# Patient Record
Sex: Female | Born: 1955 | Race: White | Hispanic: No | Smoking: Never smoker
Health system: Southern US, Community
[De-identification: ages and names within clinical notes are randomized; demographics above are authoritative.]

## PROBLEM LIST (undated history)

## (undated) DIAGNOSIS — I10 Essential (primary) hypertension: Secondary | ICD-10-CM

## (undated) DIAGNOSIS — M1990 Unspecified osteoarthritis, unspecified site: Secondary | ICD-10-CM

## (undated) DIAGNOSIS — G43909 Migraine, unspecified, not intractable, without status migrainosus: Secondary | ICD-10-CM

## (undated) DIAGNOSIS — C801 Malignant (primary) neoplasm, unspecified: Secondary | ICD-10-CM

## (undated) DIAGNOSIS — G4730 Sleep apnea, unspecified: Secondary | ICD-10-CM

## (undated) HISTORY — PX: HERNIA REPAIR: SHX51

## (undated) HISTORY — PX: WRIST SURGERY: SHX841

## (undated) HISTORY — PX: COSMETIC SURGERY: SHX468

## (undated) HISTORY — PX: REPLACEMENT TOTAL KNEE: SUR1224

## (undated) HISTORY — DX: Sleep apnea, unspecified: G47.30

## (undated) HISTORY — PX: BREAST SURGERY: SHX581

---

## 2019-02-14 ENCOUNTER — Encounter (HOSPITAL_BASED_OUTPATIENT_CLINIC_OR_DEPARTMENT_OTHER): Payer: Self-pay | Admitting: Emergency Medicine

## 2019-02-14 ENCOUNTER — Emergency Department (HOSPITAL_BASED_OUTPATIENT_CLINIC_OR_DEPARTMENT_OTHER): Payer: BLUE CROSS/BLUE SHIELD

## 2019-02-14 ENCOUNTER — Other Ambulatory Visit: Payer: Self-pay

## 2019-02-14 ENCOUNTER — Emergency Department (HOSPITAL_BASED_OUTPATIENT_CLINIC_OR_DEPARTMENT_OTHER)
Admission: EM | Admit: 2019-02-14 | Discharge: 2019-02-14 | Disposition: A | Discharge status: Home / Self Care | Payer: BLUE CROSS/BLUE SHIELD | Attending: Emergency Medicine | Admitting: Emergency Medicine

## 2019-02-14 DIAGNOSIS — I10 Essential (primary) hypertension: Secondary | ICD-10-CM | POA: Diagnosis not present

## 2019-02-14 DIAGNOSIS — Z79899 Other long term (current) drug therapy: Secondary | ICD-10-CM | POA: Diagnosis not present

## 2019-02-14 DIAGNOSIS — R51 Headache: Secondary | ICD-10-CM | POA: Diagnosis present

## 2019-02-14 DIAGNOSIS — R002 Palpitations: Secondary | ICD-10-CM | POA: Insufficient documentation

## 2019-02-14 DIAGNOSIS — Z853 Personal history of malignant neoplasm of breast: Secondary | ICD-10-CM | POA: Diagnosis not present

## 2019-02-14 HISTORY — DX: Malignant (primary) neoplasm, unspecified: C80.1

## 2019-02-14 LAB — CBC
HCT: 46.4 % — ABNORMAL HIGH (ref 36.0–46.0)
Hemoglobin: 14.8 g/dL (ref 12.0–15.0)
MCH: 28.2 pg (ref 26.0–34.0)
MCHC: 31.9 g/dL (ref 30.0–36.0)
MCV: 88.4 fL (ref 80.0–100.0)
Platelets: 278 10*3/uL (ref 150–400)
RBC: 5.25 MIL/uL — ABNORMAL HIGH (ref 3.87–5.11)
RDW: 12.9 % (ref 11.5–15.5)
WBC: 8.3 10*3/uL (ref 4.0–10.5)
nRBC: 0 % (ref 0.0–0.2)

## 2019-02-14 LAB — BASIC METABOLIC PANEL
Anion gap: 5 (ref 5–15)
BUN: 12 mg/dL (ref 8–23)
CO2: 22 mmol/L (ref 22–32)
Calcium: 9.5 mg/dL (ref 8.9–10.3)
Chloride: 109 mmol/L (ref 98–111)
Creatinine, Ser: 0.62 mg/dL (ref 0.44–1.00)
GFR calc Af Amer: >60 mL/min (ref >60)
GFR calc non Af Amer: >60 mL/min (ref >60)
Glucose, Bld: 113 mg/dL — ABNORMAL HIGH (ref 70–99)
Potassium: 3.5 mmol/L (ref 3.5–5.1)
Sodium: 136 mmol/L (ref 135–145)

## 2019-02-14 LAB — URINALYSIS, ROUTINE W REFLEX MICROSCOPIC
Bilirubin Urine: NEGATIVE
Glucose, UA: NEGATIVE mg/dL
Hgb urine dipstick: NEGATIVE
Ketones, ur: NEGATIVE mg/dL
Leukocytes,Ua: NEGATIVE
Nitrite: NEGATIVE
Protein, ur: NEGATIVE mg/dL
Specific Gravity, Urine: 1.01 (ref 1.005–1.030)
pH: 6 (ref 5.0–8.0)

## 2019-02-14 LAB — TROPONIN I: Troponin I: <0.03 ng/mL (ref <0.03)

## 2019-02-14 MED ORDER — ACETAMINOPHEN 500 MG PO TABS
1000.0000 mg | ORAL_TABLET | Freq: Once | ORAL | Status: AC
  Administered 2019-02-14: 1000 mg via ORAL
  Filled 2019-02-14: qty 2

## 2019-02-14 NOTE — Discharge Instructions (Addendum)
Your labs, urine, EKG, head CT, chest x-ray today were normal.  I recommend that she follow-up closely with your primary care doctor for further instructions on medication adjustments to control your blood pressure.  They may recommend that you increase your lisinopril back to 40 mg daily we will continue to monitor your blood pressure closely but I recommend that this recommendation come from your primary care doctor who will be following you closely.  I recommend that you check your blood pressure twice a day and keep a log of your blood pressures.

## 2019-02-14 NOTE — ED Triage Notes (Signed)
Patient presents via EMS with co elevated bp today; states she felt flushed today; states took bp at home with recordings of 177/109, 176/97, 186/113; states recent bp medication change as well; states called EMS twice today with co headache this evening. States currently feels shaky and co a headache.

## 2019-02-14 NOTE — ED Notes (Signed)
Patient transported to CT 

## 2019-02-14 NOTE — ED Provider Notes (Signed)
TIME SEEN: 12:51 AM  CHIEF COMPLAINT: hypertension, headache  HPI: Patient is a 63 year old female with previous history of breast cancer in remission, hypertension who presents to the emergency department with concerns for elevated blood pressure and posterior headache.  States that tonight she started feeling unwell.  States that she felt like her heart was racing and she could "hear myself breathing".  States she did not feel short of breath or have chest discomfort but states this was not normal for her.  She tried calling her primary care doctor and began to monitor her blood pressure closely at home.  States it was extremely elevated with pressures running in the 170s to 190s/90s to 100s.  States this is not normal for her.  She is on lisinopril 20 mg daily and just had her lisinopril decreased from 40 mg daily secondary to hypotension.  She states that at 6 PM when her symptoms were getting better she took an extra 20 mg of lisinopril and then later that night she took a dose of her hydrochlorothiazide.  States that she called EMS initially around 9:30 PM and decided not to come to the ER.  States around 11 PM she developed a posterior headache that was severe but felt similar to her prior migraines but states she has not had headaches like this for 18 years.  At that time she decided to call EMS back and come to the emergency department.  She denies any current numbness or focal weakness.  States over the past 2 weeks she has had intermittent episodes of numbness in both hands and both feet but none of this currently.  She denies of the headache was sudden onset, worse headache of her life.  No vision changes.  No chest tightness or shortness of breath.  No recent fevers, cough.  States over a week ago she had a "stomach thing" where she had diarrhea but this has resolved.  ROS: See HPI Constitutional: no fever  Eyes: no drainage  ENT: no runny nose   Cardiovascular:  no chest pain; +  palpitations Resp: no SOB  GI: no vomiting GU: no dysuria Integumentary: no rash  Allergy: no hives  Musculoskeletal: no leg swelling  Neurological: no slurred speech ROS otherwise negative  PAST MEDICAL HISTORY/PAST SURGICAL HISTORY:  Past Medical History:  Diagnosis Date  . Cancer [facility])     MEDICATIONS:  Prior to Admission medications   Medication Sig Start Date End Date Taking? Authorizing Provider  lisinopril (PRINIVIL,ZESTRIL) 20 MG tablet Take by mouth. 02/12/19 05/13/19 Yes [provider]  Cholecalciferol (VITAMIN D-1000 MAX ST) 25 MCG (1000 UT) tablet Take by mouth.    [provider]  hydrochlorothiazide (HYDRODIURIL) 12.5 MG tablet hydrochlorothiazide 12.5 mg tablet    [provider]    ALLERGIES:  Allergies  Allergen Reactions  . Nickel Dermatitis  . Tape     SOCIAL HISTORY:  Social History   Tobacco Use  . Smoking status: Never Smoker  . Smokeless tobacco: Never Used  Substance Use Topics  . Alcohol use: Never    Frequency: Never  -  FAMILY HISTORY: History reviewed. No pertinent family history.  EXAM: BP (!) 181/95 (BP Location: Left Arm)   Pulse 84   Temp 98.3 F (36.8 C) (Oral)   Resp 18   Ht 5\' 5"  (1.651 m)   Wt 97.5 kg   SpO2 98%   BMI 35.78 kg/m  CONSTITUTIONAL: Alert and oriented and responds appropriately to questions. Well-appearing; well-nourished,  obese, appears anxious HEAD: Normocephalic EYES: Conjunctivae clear, pupils appear equal, EOMI ENT: normal nose; moist mucous membranes NECK: Supple, no meningismus, no nuchal rigidity, no LAD  CARD: RRR; S1 and S2 appreciated; no murmurs, no clicks, no rubs, no gallops RESP: Normal chest excursion without splinting or tachypnea; breath sounds clear and equal bilaterally; no wheezes, no rhonchi, no rales, no hypoxia or respiratory distress, speaking full sentences ABD/GI: Normal bowel sounds; non-distended; soft, non-tender, no rebound, no guarding, no peritoneal  signs, no hepatosplenomegaly BACK:  The back appears normal with normal range of motion EXT: Normal ROM in all joints; non-tender to palpation; no edema; normal capillary refill; no cyanosis, no calf tenderness or swelling    SKIN: Normal color for age and race; warm; no rash on exposed skin NEURO: Moves all extremities equally, normal sensation diffusely, cranial nerves II through XII intact, normal speech, strength 5/5 in all 4 extremities, normal gait PSYCH: Patient appears very anxious.  Grooming and personal hygiene are appropriate.  MEDICAL DECISION MAKING: Patient here with hypertension.  Had episode of palpitations, rapid breathing earlier today.  States around 11 PM developed a severe posterior headache that has slowly improved.  No focal neurologic deficits.  I suspect that anxiety is contributing significantly to patient's symptoms today.  She appears very anxious here.  She states she is very nervous about being here in the emergency department during the COVID-19 pandemic.  States she called her on-call doctor who instructed her to come to the emergency department.  Blood pressure currently in the 150s to 170s/90s to 100s on my examination.  She has no focal neurologic deficits.  No current chest pain or shortness of breath.  Will obtain screening labs, urine to evaluate for signs of endorgan damage as well as EKG, chest x-ray given her palpitations today.  Also obtain head CT given complaints of severe headache that started at 11 PM that has improved.  Have very low suspicion for stroke, intracranial hemorrhage at this time.  She is requesting Tylenol for pain.  We will monitor her blood pressure closely in the ED.  She took 20 mg of lisinopril yesterday morning, another 20 mg of lisinopril at 6 PM last night and then took hydrochlorothiazide just prior to arrival.  At this time I do not feel she needs medication for blood pressure management but we will watch her blood pressures closely in the  ED.  I suspect they will go down with time.  ED PROGRESS: Patient reports feeling much better.  Blood pressure in the 160s/70s.  Headache gone.  Work-up in the ED unremarkable.  No signs of endorgan damage.  Chest x-ray shows mild cardiomegaly but no acute process.  Troponin negative.  EKG normal.  CT head normal.  Urine shows no hematuria or proteinuria.  Recommended she follow-up with her PCP in the morning.  She is comfortable with this plan.   At this time, I do not feel there is any life-threatening condition present. I have reviewed and discussed all results (EKG, imaging, lab, urine as appropriate) and exam findings with patient/family. I have reviewed nursing notes and appropriate previous records.  I feel the patient is safe to be discharged home without further emergent workup and can continue workup as an outpatient as needed. Discussed usual and customary return precautions. Patient/family verbalize understanding and are comfortable with this plan.  Outpatient follow-up has been provided as needed. All questions have been answered.      Date: 02/14/2019 1:16 AM  Rate: 67  Rhythm: normal sinus rhythm  QRS Axis: normal  Intervals: normal  ST/T Wave abnormalities: normal  Conduction Disutrbances: none  Narrative Interpretation: unremarkable; no old tracing for comparison       Ward, Delice Bison, DO 02/14/19 [id number]

## 2019-08-22 ENCOUNTER — Other Ambulatory Visit: Payer: Self-pay

## 2019-08-22 ENCOUNTER — Emergency Department (HOSPITAL_BASED_OUTPATIENT_CLINIC_OR_DEPARTMENT_OTHER)
Admission: EM | Admit: 2019-08-22 | Discharge: 2019-08-23 | Disposition: A | Discharge status: Home / Self Care | Payer: BC Managed Care – PPO | Attending: Emergency Medicine | Admitting: Emergency Medicine

## 2019-08-22 ENCOUNTER — Emergency Department (HOSPITAL_BASED_OUTPATIENT_CLINIC_OR_DEPARTMENT_OTHER): Payer: BC Managed Care – PPO

## 2019-08-22 ENCOUNTER — Encounter (HOSPITAL_BASED_OUTPATIENT_CLINIC_OR_DEPARTMENT_OTHER): Payer: Self-pay

## 2019-08-22 DIAGNOSIS — Z79899 Other long term (current) drug therapy: Secondary | ICD-10-CM | POA: Insufficient documentation

## 2019-08-22 DIAGNOSIS — I10 Essential (primary) hypertension: Secondary | ICD-10-CM | POA: Diagnosis present

## 2019-08-22 HISTORY — DX: Essential (primary) hypertension: I10

## 2019-08-22 HISTORY — DX: Unspecified osteoarthritis, unspecified site: M19.90

## 2019-08-22 LAB — CBC
HCT: 47.6 % — ABNORMAL HIGH (ref 36.0–46.0)
Hemoglobin: 15.1 g/dL — ABNORMAL HIGH (ref 12.0–15.0)
MCH: 28.1 pg (ref 26.0–34.0)
MCHC: 31.7 g/dL (ref 30.0–36.0)
MCV: 88.6 fL (ref 80.0–100.0)
Platelets: 267 10*3/uL (ref 150–400)
RBC: 5.37 MIL/uL — ABNORMAL HIGH (ref 3.87–5.11)
RDW: 12.5 % (ref 11.5–15.5)
WBC: 10 10*3/uL (ref 4.0–10.5)
nRBC: 0 % (ref 0.0–0.2)

## 2019-08-22 NOTE — ED Notes (Signed)
Patient denies pain.

## 2019-08-22 NOTE — ED Triage Notes (Signed)
Pt reports increased B/P today at home with the highest listed as 192/118. Pt states she has had associated "heartburn feeling," palpitations, nervous feeling. Pt appears anxious during triage.

## 2019-08-22 NOTE — ED Provider Notes (Signed)
[place] DEPT [facility] Provider Note: Georgena Spurling, MD, FACEP  CSN: [id number] MRN: [id number] ARRIVAL: 08/22/19 at 2253 ROOM: [place]  Hypertension   HISTORY OF PRESENT ILLNESS  08/22/19 11:34 PM Kirsten Choi is a 63 y.o. female she reports her blood pressure being elevated today with her highest reading at 192/118.  On arrival here was 193/104.  She has had an associated sensation of heartburn and bloating in her abdomen for which she has taken Tums with partial relief.  She has had palpitations palpitations (both rapid heartbeat and skipped beats) and feeling nervous.  She denies chest pain, change in her baseline shortness of breath, headache or focal neurologic changes.  She recently had her dose of lisinopril lowered to 30 mg but states she has been compliant daily.     Past Medical History:  Diagnosis Date  . Arthritis   . Cancer ([place])    breast CA (resolved)  . Hypertension     Past Surgical History:  Procedure Laterality Date  . BREAST SURGERY    . WRIST SURGERY      No family history on file.  Social History   Tobacco Use  . Smoking status: Never Smoker  . Smokeless tobacco: Never Used  Substance Use Topics  . Alcohol use: Never    Frequency: Never  . Drug use: Never    Prior to Admission medications   Medication Sig Start Date End Date Taking? Authorizing Provider  Cholecalciferol (VITAMIN D-1000 MAX ST) 25 MCG (1000 UT) tablet Take by mouth.    [provider]  hydrochlorothiazide (HYDRODIURIL) 12.5 MG tablet hydrochlorothiazide 12.5 mg tablet    [provider]  lisinopril (PRINIVIL,ZESTRIL) 20 MG tablet Take by mouth. 02/12/19 05/13/19  [provider]    Allergies Nickel and Tape   REVIEW OF SYSTEMS  Negative except as noted here or in the History of Present Illness.   PHYSICAL EXAMINATION  Initial Vital Signs Blood pressure (!) 193/104, pulse (!) 112, temperature 98.1 F (36.7 C),  temperature source Oral, resp. rate 20, height 5\' 5"  (1.651 m), weight 94.3 kg, SpO2 98 %.  Examination General: Well-developed, well-nourished female in no acute distress; appearance consistent with age of record HENT: normocephalic; atraumatic Eyes: pupils equal, round and reactive to light; extraocular muscles intact Neck: supple Heart: regular rate and rhythm Lungs: clear to auscultation bilaterally Abdomen: soft; nondistended; nontender; bowel sounds present Extremities: No deformity; full range of motion; pulses normal Neurologic: Awake, alert and oriented; motor function intact in all extremities and symmetric; no facial droop Skin: Warm and dry Psychiatric: Anxious   RESULTS  Summary of this visit's results, reviewed by myself:   EKG Interpretation  Date/Time:  Friday August 22 2019 23:07:36 EDT Ventricular Rate:  95 PR Interval:    QRS Duration: 87 QT Interval:  332 QTC Calculation: 418 R Axis:   -10 Text Interpretation:  Sinus rhythm Probable left atrial enlargement Low voltage, precordial leads Rate is faster Confirmed by Anira Senegal [phone]) on 08/22/2019 11:32:57 PM      Laboratory Studies: Results for orders placed or performed during the hospital encounter of 08/22/19 (from the past 24 hour(s))  Basic metabolic panel     Status: Abnormal   Collection Time: 08/22/19 11:25 PM  Result Value Ref Range   Sodium 140 135 - 145 mmol/L   Potassium 3.8 3.5 - 5.1 mmol/L   Chloride 108 98 - 111 mmol/L   CO2 20 (L) 22 - 32 mmol/L  Glucose, Bld 110 (H) 70 - 99 mg/dL   BUN 14 8 - 23 mg/dL   Creatinine, Ser 0.58 0.44 - 1.00 mg/dL   Calcium 9.5 8.9 - 10.3 mg/dL   GFR calc non Af Amer >60 >60 mL/min   GFR calc Af Amer >60 >60 mL/min   Anion gap 12 5 - 15  CBC     Status: Abnormal   Collection Time: 08/22/19 11:25 PM  Result Value Ref Range   WBC 10.0 4.0 - 10.5 K/uL   RBC 5.37 (H) 3.87 - 5.11 MIL/uL   Hemoglobin 15.1 (H) 12.0 - 15.0 g/dL   HCT 47.6 (H) 36.0 - 46.0  %   MCV 88.6 80.0 - 100.0 fL   MCH 28.1 26.0 - 34.0 pg   MCHC 31.7 30.0 - 36.0 g/dL   RDW 12.5 11.5 - 15.5 %   Platelets 267 150 - 400 K/uL   nRBC 0.0 0.0 - 0.2 %  Troponin I (High Sensitivity)     Status: None   Collection Time: 08/22/19 11:25 PM  Result Value Ref Range   Troponin I (High Sensitivity) 13 <18 ng/L   Imaging Studies: Dg Chest 2 View  Result Date: 08/22/2019 CLINICAL DATA:  Palpitations, elevated blood pressure. EXAM: CHEST - 2 VIEW COMPARISON:  Chest radiograph 02/14/2019 FINDINGS: Streaky areas of opacity favoring atelectasis with low lung volumes. No consolidation, features of edema, pneumothorax, or effusion. Pulmonary vascularity is normally distributed. The cardiomediastinal contours are unremarkable. No acute osseous or soft tissue abnormality. Surgical clips project over the right chest wall. IMPRESSION: 1. No active cardiopulmonary disease. 2. Streaky areas of opacity favoring atelectasis. Electronically Signed   By: Lovena Le M.D.   On: 08/22/2019 23:24    ED COURSE and MDM  Nursing notes and initial vitals signs, including pulse oximetry, reviewed.  Vitals:   08/22/19 2304 08/22/19 2305  BP: (!) 193/104   Pulse: (!) 112   Resp: 20   Temp: 98.1 F (36.7 C)   TempSrc: Oral   SpO2: 98%   Weight:  94.3 kg  Height:  5\' 5"  (1.651 m)   12:43 AM Patient's BP now 172/100 without intervention.  Patient's rhythm strip reviewed for her entire visit.  No ectopy or tachyarrhythmias seen.  There is no evidence of acute endorgan damage so acute intervention and her blood pressure is not indicated at this time.  She was advised to contact her primary care physician Monday (the day after tomorrow) regarding medication management.  PROCEDURES    ED DIAGNOSES     ICD-10-CM   1. Hypertension not at goal  [facility], Jenny Reichmann, MD 08/23/19 [phone]

## 2019-08-23 ENCOUNTER — Encounter (HOSPITAL_BASED_OUTPATIENT_CLINIC_OR_DEPARTMENT_OTHER): Payer: Self-pay | Admitting: Emergency Medicine

## 2019-08-23 ENCOUNTER — Other Ambulatory Visit: Payer: Self-pay

## 2019-08-23 ENCOUNTER — Emergency Department (HOSPITAL_BASED_OUTPATIENT_CLINIC_OR_DEPARTMENT_OTHER)
Admission: EM | Admit: 2019-08-23 | Discharge: 2019-08-23 | Disposition: A | Discharge status: Home / Self Care | Payer: BC Managed Care – PPO | Source: Home / Self Care | Attending: Emergency Medicine | Admitting: Emergency Medicine

## 2019-08-23 ENCOUNTER — Emergency Department (HOSPITAL_BASED_OUTPATIENT_CLINIC_OR_DEPARTMENT_OTHER): Payer: BC Managed Care – PPO

## 2019-08-23 DIAGNOSIS — I10 Essential (primary) hypertension: Secondary | ICD-10-CM | POA: Insufficient documentation

## 2019-08-23 DIAGNOSIS — G43409 Hemiplegic migraine, not intractable, without status migrainosus: Secondary | ICD-10-CM

## 2019-08-23 DIAGNOSIS — Z91048 Other nonmedicinal substance allergy status: Secondary | ICD-10-CM | POA: Insufficient documentation

## 2019-08-23 DIAGNOSIS — Z79899 Other long term (current) drug therapy: Secondary | ICD-10-CM | POA: Insufficient documentation

## 2019-08-23 DIAGNOSIS — R519 Headache, unspecified: Secondary | ICD-10-CM | POA: Insufficient documentation

## 2019-08-23 DIAGNOSIS — G43909 Migraine, unspecified, not intractable, without status migrainosus: Secondary | ICD-10-CM | POA: Insufficient documentation

## 2019-08-23 DIAGNOSIS — Z853 Personal history of malignant neoplasm of breast: Secondary | ICD-10-CM | POA: Insufficient documentation

## 2019-08-23 HISTORY — DX: Migraine, unspecified, not intractable, without status migrainosus: G43.909

## 2019-08-23 LAB — BASIC METABOLIC PANEL
Anion gap: 12 (ref 5–15)
BUN: 14 mg/dL (ref 8–23)
CO2: 20 mmol/L — ABNORMAL LOW (ref 22–32)
Calcium: 9.5 mg/dL (ref 8.9–10.3)
Chloride: 108 mmol/L (ref 98–111)
Creatinine, Ser: 0.58 mg/dL (ref 0.44–1.00)
GFR calc Af Amer: >60 mL/min (ref >60)
GFR calc non Af Amer: >60 mL/min (ref >60)
Glucose, Bld: 110 mg/dL — ABNORMAL HIGH (ref 70–99)
Potassium: 3.8 mmol/L (ref 3.5–5.1)
Sodium: 140 mmol/L (ref 135–145)

## 2019-08-23 LAB — TROPONIN I (HIGH SENSITIVITY): Troponin I (High Sensitivity): 13 ng/L (ref <18)

## 2019-08-23 MED ORDER — DIPHENHYDRAMINE HCL 50 MG/ML IJ SOLN
25.0000 mg | Freq: Once | INTRAMUSCULAR | Status: AC
  Administered 2019-08-23: 25 mg via INTRAVENOUS
  Filled 2019-08-23: qty 1

## 2019-08-23 MED ORDER — CLONIDINE HCL 0.1 MG PO TABS
0.2000 mg | ORAL_TABLET | Freq: Once | ORAL | Status: AC
  Administered 2019-08-23: 0.2 mg via ORAL
  Filled 2019-08-23: qty 2

## 2019-08-23 MED ORDER — LISINOPRIL 10 MG PO TABS
10.0000 mg | ORAL_TABLET | Freq: Once | ORAL | Status: AC
  Administered 2019-08-23: 18:00:00 10 mg via ORAL
  Filled 2019-08-23: qty 1

## 2019-08-23 MED ORDER — KETOROLAC TROMETHAMINE 15 MG/ML IJ SOLN
15.0000 mg | Freq: Once | INTRAMUSCULAR | Status: AC
  Administered 2019-08-23: 06:00:00 15 mg via INTRAVENOUS
  Filled 2019-08-23: qty 1

## 2019-08-23 MED ORDER — METOCLOPRAMIDE HCL 5 MG/ML IJ SOLN
10.0000 mg | Freq: Once | INTRAMUSCULAR | Status: AC
  Administered 2019-08-23: 10 mg via INTRAVENOUS
  Filled 2019-08-23: qty 2

## 2019-08-23 NOTE — ED Provider Notes (Signed)
[place] EMERGENCY DEPARTMENT Provider Note   CSN: [id number] Arrival date & time: 08/23/19  1613     History   Chief Complaint Chief Complaint  Patient presents with  . Hypertension    HPI Kirsten Choi is a 63 y.o. female hx of HTN, here presenting with hypertension, headaches.  Patient states that her lisinopril was decreased from 40 mg down to 30 mg about a week ago because her blood pressure was running low.  She states that for the last 2 days, she has been having elevated blood pressure initially to the 160s and 170s. She was seen twice last night for uncontrolled hypertension.  She had labs drawn that were unremarkable. No head imaging was performed. She came back today because she has persistent headaches and BP in the 180s. Denies any trouble speaking or weakness or numbness.  Patient called her doctor was sent here for further evaluation.    The history is provided by the patient.    Past Medical History:  Diagnosis Date  . Arthritis   . Cancer ([place])    breast CA (resolved)  . Hypertension   . Migraine     There are no active problems to display for this patient.   Past Surgical History:  Procedure Laterality Date  . BREAST SURGERY    . WRIST SURGERY       OB History   No obstetric history on file.      Home Medications    Prior to Admission medications   Medication Sig Start Date End Date Taking? Authorizing Provider  acetaminophen (TYLENOL) 500 MG tablet Take 1,000 mg by mouth every 6 (six) hours as needed.   Yes [provider]  hydrochlorothiazide (HYDRODIURIL) 12.5 MG tablet hydrochlorothiazide 12.5 mg tablet   Yes [provider]  lisinopril (ZESTRIL) 30 MG tablet Take 30 mg by mouth daily.   Yes [provider]  Cholecalciferol (VITAMIN D-1000 MAX ST) 25 MCG (1000 UT) tablet Take by mouth.    [provider]  lisinopril (PRINIVIL,ZESTRIL) 20 MG tablet Take by mouth. 02/12/19 05/13/19  [provider]    Family History No family history on file.  Social History Social History   Tobacco Use  . Smoking status: Never Smoker  . Smokeless tobacco: Never Used  Substance Use Topics  . Alcohol use: Never    Frequency: Never  . Drug use: Never     Allergies   Nickel and Tape   Review of Systems Review of Systems  Neurological: Positive for headaches.  All other systems reviewed and are negative.    Physical Exam Updated Vital Signs BP (!) 203/111 (BP Location: Left Arm)   Pulse 88   Temp 98.2 F (36.8 C) (Oral)   Resp 18   Ht 5\' 5"  (1.651 m)   Wt 93.3 kg   SpO2 99%   BMI 34.23 kg/m   Physical Exam Vitals signs and nursing note reviewed.  Constitutional:      Comments: Anxious   HENT:     Head: Normocephalic.     Mouth/Throat:     Mouth: Mucous membranes are moist.  Eyes:     Extraocular Movements: Extraocular movements intact.     Pupils: Pupils are equal, round, and reactive to light.  Neck:     Musculoskeletal: Normal range of motion.  Cardiovascular:     Rate and Rhythm: Normal rate and regular rhythm.     Pulses: Normal pulses.  Pulmonary:  Effort: Pulmonary effort is normal.     Breath sounds: Normal breath sounds.  Abdominal:     General: Abdomen is flat.     Palpations: Abdomen is soft.  Musculoskeletal: Normal range of motion.  Skin:    General: Skin is warm.     Capillary Refill: Capillary refill takes less than 2 seconds.  Neurological:     General: No focal deficit present.     Mental Status: She is alert and oriented to person, place, and time.     Comments: CN 2- 12 intact, nl strength and sensation throughout. Nl gait. No pronator drift   Psychiatric:        Mood and Affect: Mood normal.      ED Treatments / Results  Labs (all labs ordered are listed, but only abnormal results are displayed) Labs Reviewed - No data to display  EKG None  Radiology Dg Chest 2 View  Result Date: 08/22/2019 CLINICAL DATA:   Palpitations, elevated blood pressure. EXAM: CHEST - 2 VIEW COMPARISON:  Chest radiograph 02/14/2019 FINDINGS: Streaky areas of opacity favoring atelectasis with low lung volumes. No consolidation, features of edema, pneumothorax, or effusion. Pulmonary vascularity is normally distributed. The cardiomediastinal contours are unremarkable. No acute osseous or soft tissue abnormality. Surgical clips project over the right chest wall. IMPRESSION: 1. No active cardiopulmonary disease. 2. Streaky areas of opacity favoring atelectasis. Electronically Signed   By: Lovena Le M.D.   On: 08/22/2019 23:24   Ct Head Wo Contrast  Result Date: 08/23/2019 CLINICAL DATA:  63 year old female with headache. EXAM: CT HEAD WITHOUT CONTRAST TECHNIQUE: Contiguous axial images were obtained from the base of the skull through the vertex without intravenous contrast. COMPARISON:  Head CT dated 02/14/2019 FINDINGS: Brain: The ventricles and sulci appropriate size for patient's age. The gray-white matter discrimination is preserved. There is no acute intracranial hemorrhage. No mass effect or midline shift. No extra-axial fluid collection. Vascular: No hyperdense vessel or unexpected calcification. Skull: Normal. Negative for fracture or focal lesion. Sinuses/Orbits: No acute finding. Other: None IMPRESSION: Unremarkable noncontrast CT of the brain. Electronically Signed   By: Anner Crete M.D.   On: 08/23/2019 18:14    Procedures Procedures (including critical care time)  Medications Ordered in ED Medications  lisinopril (ZESTRIL) tablet 10 mg (10 mg Oral Given 08/23/19 1742)  cloNIDine (CATAPRES) tablet 0.2 mg (0.2 mg Oral Given 08/23/19 1843)     Initial Impression / Assessment and Plan / ED Course  I have reviewed the triage vital signs and the nursing notes.  Pertinent labs & imaging results that were available during my care of the patient were reviewed by me and considered in my medical decision making (see  chart for details).       Kirsten Choi is a 63 y.o. female here with headaches. Patient's lisinopril was decreased to 30 mg from 40 mg a week ago and now BP in the 190s. I think likely symptomatic hypertension and she is very anxious. Labs yesterday were unremarkable. Will get CT head to r/o bleed. Will give extra dose of lisinopril and recheck blood pressure.   7:53 PM CT head unremarkable. BP down to 160s now. Will have her increase her lisinopril to 40 mg daily and continue HCTZ   Final Clinical Impressions(s) / ED Diagnoses   Final diagnoses:  None    ED Discharge Orders    None       Drenda Freeze, MD 08/23/19 1954

## 2019-08-23 NOTE — ED Triage Notes (Signed)
Patient presents via EMS with complaints of headache and nausea; patient seen here earlier today for hypertension and discharged. Patient ambulatory with steady gait.

## 2019-08-23 NOTE — ED Notes (Signed)
Pt given meal and waiting for ride.

## 2019-08-23 NOTE — ED Provider Notes (Signed)
[facility] DE[facility] Provider Note: Georgena Spurling, MD, FACEP  CSN: [id number] MRN: [id number] ARRIVAL: 08/23/19 at [place]: [place]  Headache   HISTORY OF PRESENT ILLNESS  08/23/19 5:33 AM Kirsten Choi is a 63 y.o. female who was seen by myself earlier this shift for elevated blood pressure.  Her work-up was benign and she was discharged home with reassurance.  She returns by ambulance with a headache that began gradually several hours ago and has worsened.  The onset was associated with scotomata.  The pain is located in her occipital region radiating to the top of her head.  Her pain is sharp and throbbing and she rates it as an 8 out of 10.  There is associated nausea without vomiting and photophobia.  Symptoms are like previous migraines of which she has a remote history.  She has not had a headache like this in years.  She is having no focal neurologic deficits.  She tried applying ice without relief.   Past Medical History:  Diagnosis Date  . Arthritis   . Cancer ([place])    breast CA (resolved)  . Hypertension   . Migraine     Past Surgical History:  Procedure Laterality Date  . BREAST SURGERY    . WRIST SURGERY      History reviewed. No pertinent family history.  Social History   Tobacco Use  . Smoking status: Never Smoker  . Smokeless tobacco: Never Used  Substance Use Topics  . Alcohol use: Never    Frequency: Never  . Drug use: Never    Prior to Admission medications   Medication Sig Start Date End Date Taking? Authorizing Provider  Cholecalciferol (VITAMIN D-1000 MAX ST) 25 MCG (1000 UT) tablet Take by mouth.    [provider]  hydrochlorothiazide (HYDRODIURIL) 12.5 MG tablet hydrochlorothiazide 12.5 mg tablet    [provider]  lisinopril (PRINIVIL,ZESTRIL) 20 MG tablet Take by mouth. 02/12/19 05/13/19  [provider]    Allergies Nickel and Tape   REVIEW OF SYSTEMS  Negative except as noted here  or in the History of Present Illness.   PHYSICAL EXAMINATION  Initial Vital Signs Blood pressure (!) 164/124, pulse 85, temperature 98.3 F (36.8 C), temperature source Oral, resp. rate 18, weight 94 kg, SpO2 100 %.  Examination General: Well-developed, well-nourished female in no acute distress; appearance consistent with age of record HENT: normocephalic; atraumatic Eyes: pupils equal, round and reactive to light; extraocular muscles intact; photophobia Neck: supple Heart: regular rate and rhythm Lungs: clear to auscultation bilaterally Abdomen: soft; nondistended; nontender; bowel sounds present Extremities: No deformity; full range of motion; pulses normal Neurologic: Awake, alert and oriented; motor function intact in all extremities and symmetric; no facial droop; normal coordination, speech and gait Skin: Warm and dry Psychiatric: Tearful   RESULTS  Summary of this visit's results, reviewed by myself:   EKG Interpretation  Date/Time:    Ventricular Rate:    PR Interval:    QRS Duration:   QT Interval:    QTC Calculation:   R Axis:     Text Interpretation:        Laboratory Studies: Results for orders placed or performed during the hospital encounter of 08/22/19 (from the past 24 hour(s))  Basic metabolic panel     Status: Abnormal   Collection Time: 08/22/19 11:25 PM  Result Value Ref Range   Sodium 140 135 - 145 mmol/L   Potassium 3.8 3.5 - 5.1  mmol/L   Chloride 108 98 - 111 mmol/L   CO2 20 (L) 22 - 32 mmol/L   Glucose, Bld 110 (H) 70 - 99 mg/dL   BUN 14 8 - 23 mg/dL   Creatinine, Ser 0.58 0.44 - 1.00 mg/dL   Calcium 9.5 8.9 - 10.3 mg/dL   GFR calc non Af Amer >60 >60 mL/min   GFR calc Af Amer >60 >60 mL/min   Anion gap 12 5 - 15  CBC     Status: Abnormal   Collection Time: 08/22/19 11:25 PM  Result Value Ref Range   WBC 10.0 4.0 - 10.5 K/uL   RBC 5.37 (H) 3.87 - 5.11 MIL/uL   Hemoglobin 15.1 (H) 12.0 - 15.0 g/dL   HCT 47.6 (H) 36.0 - 46.0 %   MCV  88.6 80.0 - 100.0 fL   MCH 28.1 26.0 - 34.0 pg   MCHC 31.7 30.0 - 36.0 g/dL   RDW 12.5 11.5 - 15.5 %   Platelets 267 150 - 400 K/uL   nRBC 0.0 0.0 - 0.2 %  Troponin I (High Sensitivity)     Status: None   Collection Time: 08/22/19 11:25 PM  Result Value Ref Range   Troponin I (High Sensitivity) 13 <18 ng/L   Imaging Studies: Dg Chest 2 View  Result Date: 08/22/2019 CLINICAL DATA:  Palpitations, elevated blood pressure. EXAM: CHEST - 2 VIEW COMPARISON:  Chest radiograph 02/14/2019 FINDINGS: Streaky areas of opacity favoring atelectasis with low lung volumes. No consolidation, features of edema, pneumothorax, or effusion. Pulmonary vascularity is normally distributed. The cardiomediastinal contours are unremarkable. No acute osseous or soft tissue abnormality. Surgical clips project over the right chest wall. IMPRESSION: 1. No active cardiopulmonary disease. 2. Streaky areas of opacity favoring atelectasis. Electronically Signed   By: Lovena Le M.D.   On: 08/22/2019 23:24    ED COURSE and MDM  Nursing notes and initial vitals signs, including pulse oximetry, reviewed.  Vitals:   08/23/19 0459  BP: (!) 164/124  Pulse: 85  Resp: 18  Temp: 98.3 F (36.8 C)  TempSrc: Oral  SpO2: 100%  Weight: 94 kg   6:51 AM Patient's pain significantly improved but not completely abated after IV medications.  Patient able to ambulate without difficulty.  History consistent with migraine.  PROCEDURES    ED DIAGNOSES     ICD-10-CM   1. Sporadic migraine  G43.Arnot        Uzoma Vivona, Jenny Reichmann, MD 08/23/19 [phone]

## 2019-08-23 NOTE — ED Notes (Signed)
Pt tolerating water for po challenge.

## 2019-08-23 NOTE — Discharge Instructions (Signed)
Increase lisinopril to 40 mg daily  Continue your other meds   See your doctor in a week   Return to ER if you have worse headaches, chest pain, dizziness

## 2019-08-23 NOTE — ED Triage Notes (Signed)
Reports being here yesterday for elevated bp.  Reports came back today because it is still elevated.  Reports the migraine she had yesterday went away but keeps coming back.

## 2019-08-26 ENCOUNTER — Other Ambulatory Visit: Payer: Self-pay

## 2019-08-26 ENCOUNTER — Emergency Department (HOSPITAL_BASED_OUTPATIENT_CLINIC_OR_DEPARTMENT_OTHER): Payer: BC Managed Care – PPO

## 2019-08-26 ENCOUNTER — Encounter (HOSPITAL_BASED_OUTPATIENT_CLINIC_OR_DEPARTMENT_OTHER): Payer: Self-pay

## 2019-08-26 ENCOUNTER — Emergency Department (HOSPITAL_BASED_OUTPATIENT_CLINIC_OR_DEPARTMENT_OTHER)
Admission: EM | Admit: 2019-08-26 | Discharge: 2019-08-26 | Disposition: A | Discharge status: Home / Self Care | Payer: BC Managed Care – PPO | Attending: Emergency Medicine | Admitting: Emergency Medicine

## 2019-08-26 DIAGNOSIS — I10 Essential (primary) hypertension: Secondary | ICD-10-CM | POA: Diagnosis present

## 2019-08-26 DIAGNOSIS — R1084 Generalized abdominal pain: Secondary | ICD-10-CM | POA: Insufficient documentation

## 2019-08-26 LAB — URINALYSIS, ROUTINE W REFLEX MICROSCOPIC
Bilirubin Urine: NEGATIVE
Glucose, UA: NEGATIVE mg/dL
Ketones, ur: NEGATIVE mg/dL
Leukocytes,Ua: NEGATIVE
Nitrite: NEGATIVE
Protein, ur: NEGATIVE mg/dL
Specific Gravity, Urine: 1.02 (ref 1.005–1.030)
pH: 6 (ref 5.0–8.0)

## 2019-08-26 LAB — CBC WITH DIFFERENTIAL/PLATELET
Abs Immature Granulocytes: 0.04 10*3/uL (ref 0.00–0.07)
Basophils Absolute: 0.1 10*3/uL (ref 0.0–0.1)
Basophils Relative: 1 %
Eosinophils Absolute: 0.1 10*3/uL (ref 0.0–0.5)
Eosinophils Relative: 1 %
HCT: 46 % (ref 36.0–46.0)
Hemoglobin: 14.7 g/dL (ref 12.0–15.0)
Immature Granulocytes: 0 %
Lymphocytes Relative: 15 %
Lymphs Abs: 1.4 10*3/uL (ref 0.7–4.0)
MCH: 28.4 pg (ref 26.0–34.0)
MCHC: 32 g/dL (ref 30.0–36.0)
MCV: 88.8 fL (ref 80.0–100.0)
Monocytes Absolute: 0.9 10*3/uL (ref 0.1–1.0)
Monocytes Relative: 9 %
Neutro Abs: 7.2 10*3/uL (ref 1.7–7.7)
Neutrophils Relative %: 74 %
Platelets: 299 10*3/uL (ref 150–400)
RBC: 5.18 MIL/uL — ABNORMAL HIGH (ref 3.87–5.11)
RDW: 12.4 % (ref 11.5–15.5)
WBC: 9.7 10*3/uL (ref 4.0–10.5)
nRBC: 0 % (ref 0.0–0.2)

## 2019-08-26 LAB — COMPREHENSIVE METABOLIC PANEL
ALT: 20 U/L (ref 0–44)
AST: 14 U/L — ABNORMAL LOW (ref 15–41)
Albumin: 4.3 g/dL (ref 3.5–5.0)
Alkaline Phosphatase: 69 U/L (ref 38–126)
Anion gap: 9 (ref 5–15)
BUN: 20 mg/dL (ref 8–23)
CO2: 24 mmol/L (ref 22–32)
Calcium: 9.3 mg/dL (ref 8.9–10.3)
Chloride: 106 mmol/L (ref 98–111)
Creatinine, Ser: 0.68 mg/dL (ref 0.44–1.00)
GFR calc Af Amer: >60 mL/min (ref >60)
GFR calc non Af Amer: >60 mL/min (ref >60)
Glucose, Bld: 110 mg/dL — ABNORMAL HIGH (ref 70–99)
Potassium: 3.7 mmol/L (ref 3.5–5.1)
Sodium: 139 mmol/L (ref 135–145)
Total Bilirubin: 0.5 mg/dL (ref 0.3–1.2)
Total Protein: 7.1 g/dL (ref 6.5–8.1)

## 2019-08-26 LAB — LIPASE, BLOOD: Lipase: 48 U/L (ref 11–51)

## 2019-08-26 LAB — URINALYSIS, MICROSCOPIC (REFLEX): WBC, UA: NONE SEEN WBC/hpf (ref 0–5)

## 2019-08-26 NOTE — Discharge Instructions (Signed)

## 2019-08-26 NOTE — ED Triage Notes (Signed)
Pt c/o LUQ pain that started yesterday PM. Pt also c/o B/P being high. Pt c/o associated bloated feeling and swelling in the legs.

## 2019-08-26 NOTE — ED Provider Notes (Signed)
Emergency Department Provider Note   I have reviewed the triage vital signs and the nursing notes.   HISTORY  Chief Complaint Abdominal Pain and Hypertension   HPI Kirsten Choi is a 63 y.o. female with past medical history of hypertension presents to the emergency department with continued fluctuations in blood pressure.  Denies active headache symptoms, which brought her into the emergency department previously, but is having some abdominal bloating and lower leg swelling.  She shows me her blood pressure log which shows fluctuations in her blood pressure with lowest blood pressure systolic [id number] to highest systolic [id number].  She denies chest pain or shortness of breath.  She notes regular bowel movements but had some straining several days ago and took MiraLAX.  She feels that this MiraLAX may have caused her blood pressure to increase.  She continues to have regular bowel movements without diarrhea or additional constipation.  No blood in the bowel movements.  Denies vision changes, headaches.  She called her primary care doctor about some of her elevated blood pressure readings this afternoon and has been scheduled to see her PCP at 9 AM tomorrow.  She continues to be compliant with her lisinopril 40 mg tabs.    Past Medical History:  Diagnosis Date  . Arthritis   . Cancer ([place])    breast CA (resolved)  . Hypertension   . Migraine     There are no active problems to display for this patient.   Past Surgical History:  Procedure Laterality Date  . BREAST SURGERY    . HERNIA REPAIR    . WRIST SURGERY      Allergies Nickel and Tape  No family history on file.  Social History Social History   Tobacco Use  . Smoking status: Never Smoker  . Smokeless tobacco: Never Used  Substance Use Topics  . Alcohol use: Never    Frequency: Never  . Drug use: Never    Review of Systems  Constitutional: No fever/chills Eyes: No visual changes. ENT: No sore throat. Cardiovascular:  Denies chest pain. Positive elevated BP.  Respiratory: Denies shortness of breath.  Gastrointestinal: Mild abdominal pain/distension.  No nausea, no vomiting.  No diarrhea.  No constipation. Genitourinary: Negative for dysuria. Musculoskeletal: Negative for back pain. Skin: Negative for rash. Neurological: Negative for headaches, focal weakness or numbness.  10-point ROS otherwise negative.  ____________________________________________   PHYSICAL EXAM:  VITAL SIGNS: ED Triage Vitals  Enc Vitals Group     BP 08/26/19 2111 (!) 178/95     Pulse Rate 08/26/19 2111 84     Resp 08/26/19 2111 18     Temp 08/26/19 2111 98.4 F (36.9 C)     Temp Source 08/26/19 2111 Oral     SpO2 08/26/19 2111 96 %     Weight 08/26/19 2111 205 lb (93 kg)     Height 08/26/19 2111 5\' 5"  (1.651 m)   Constitutional: Alert and oriented. Well appearing and in no acute distress. Eyes: Conjunctivae are normal.  Head: Atraumatic. Nose: No congestion/rhinnorhea. Mouth/Throat: Mucous membranes are moist.  Neck: No stridor.  Cardiovascular: Normal rate, regular rhythm. Good peripheral circulation. Grossly normal heart sounds.   Respiratory: Normal respiratory effort.  No retractions. Lungs CTAB. Gastrointestinal: Soft and nontender. No distention.  Musculoskeletal: No lower extremity tenderness with trace bilateral pitting edema. No gross deformities of extremities. Neurologic:  Normal speech and language. No gross focal neurologic deficits are appreciated.  Skin:  Skin is warm, dry and intact. No  rash noted.   ____________________________________________   LABS (all labs ordered are listed, but only abnormal results are displayed)  Labs Reviewed  COMPREHENSIVE METABOLIC PANEL - Abnormal; Notable for the following components:      Result Value   Glucose, Bld 110 (*)    AST 14 (*)    All other components within normal limits  CBC WITH DIFFERENTIAL/PLATELET - Abnormal; Notable for the following  components:   RBC 5.18 (*)    All other components within normal limits  URINALYSIS, ROUTINE W REFLEX MICROSCOPIC - Abnormal; Notable for the following components:   Hgb urine dipstick TRACE (*)    All other components within normal limits  URINALYSIS, MICROSCOPIC (REFLEX) - Abnormal; Notable for the following components:   Bacteria, UA RARE (*)    All other components within normal limits  LIPASE, BLOOD   ____________________________________________  RADIOLOGY  Dg Abdomen Acute W/chest  Result Date: 08/26/2019 CLINICAL DATA:  Abdominal pain EXAM: DG ABDOMEN ACUTE W/ 1V CHEST COMPARISON:  None. FINDINGS: There is no evidence of dilated bowel loops or free intraperitoneal air. Surgical clips are seen throughout the abdomen. No radiopaque calculi or other significant radiographic abnormality is seen. Heart size and mediastinal contours are within normal limits. Both lungs are clear. IMPRESSION: Negative abdominal radiographs.  No acute cardiopulmonary disease. Electronically Signed   By: Prudencio Pair M.D.   On: 08/26/2019 22:21    ____________________________________________   PROCEDURES  Procedure(s) performed:   Procedures  None ____________________________________________   INITIAL IMPRESSION / ASSESSMENT AND PLAN / ED COURSE  Pertinent labs & imaging results that were available during my care of the patient were reviewed by me and considered in my medical decision making (see chart for details).   Patient presents to the emergency department with abdominal pain/distention along with elevated blood pressure here.  Triage blood pressure is 178/95.  Patient denies chest pain, shortness of breath, neuro symptoms.  Her neurologic exam is normal.  Her abdomen is diffusely soft and nontender.  I reviewed her blood pressure log at bedside which does show some fluctuation in her blood pressure readings.  She has a scheduled appointment with her PCP at 9 AM tomorrow to discuss her blood  pressure medication in detail.  With some trace pitting edema on exam I do plan for screening lab work to rule out hypertensive emergency.  I do not plan to acutely lower the patient's blood pressure given they have been somewhat labile.  Plan for plain film of the abdomen and reassess.   Plain films and labs interpreted. No acute findings. Plan for continued BP mgmt as is and f/u with PCP for schedule 9AM appt tomorrow. Discussed ED return precautions. Patient feeling relieved regarding plan and pleased at discharge.  ____________________________________________  FINAL CLINICAL IMPRESSION(S) / ED DIAGNOSES  Final diagnoses:  Essential hypertension  Generalized abdominal pain    Note:  This document was prepared using Dragon voice recognition software and may include unintentional dictation errors.  Nanda Quinton, MD, [facility] Emergency Medicine    Abdo Denault, Wonda [facility], MD 08/27/19 1329

## 2019-10-16 ENCOUNTER — Ambulatory Visit: Payer: BC Managed Care – PPO | Admitting: Cardiovascular Disease

## 2019-12-29 ENCOUNTER — Telehealth: Payer: Self-pay

## 2019-12-29 NOTE — Telephone Encounter (Signed)
NOTES ON FILE FROM [facility] [phone] SENT REFERRAL TO [place]

## 2020-01-20 ENCOUNTER — Other Ambulatory Visit: Payer: Self-pay

## 2020-01-20 ENCOUNTER — Encounter: Payer: Self-pay | Admitting: Cardiovascular Disease

## 2020-01-20 ENCOUNTER — Ambulatory Visit: Payer: BC Managed Care – PPO | Admitting: Cardiovascular Disease

## 2020-01-20 VITALS — BP 156/96 | HR 61 | Ht 65.0 in | Wt 191.4 lb

## 2020-01-20 DIAGNOSIS — R002 Palpitations: Secondary | ICD-10-CM

## 2020-01-20 DIAGNOSIS — I10 Essential (primary) hypertension: Secondary | ICD-10-CM | POA: Diagnosis not present

## 2020-01-20 DIAGNOSIS — E7800 Pure hypercholesterolemia, unspecified: Secondary | ICD-10-CM

## 2020-01-20 DIAGNOSIS — I471 Supraventricular tachycardia: Secondary | ICD-10-CM

## 2020-01-20 DIAGNOSIS — R001 Bradycardia, unspecified: Secondary | ICD-10-CM | POA: Insufficient documentation

## 2020-01-20 NOTE — Assessment & Plan Note (Signed)
Brief beats of SVT noticed on Zio patch monitoring few months ago. Patient reports none frequent episodes of heart racing back to that time but no recent episode.  Her EKG today is sinus rhythm.  She drinks 2 cups of coffee every day.  We recommended avoiding stimulant like coffee and tea as first step of managing paroxysmal SVT.  She agrees with the plan.  We will see her in clinic again in 56-month. -Recommended to taper/stop drinking coffee and other stimulant -Avoiding beta-blockers in setting of bradycardia -Checking thyroid panel -Follow-up in clinic in 3 months

## 2020-01-20 NOTE — Assessment & Plan Note (Addendum)
Kirsten Choi is a new patient in clinic today. Patient has been on as needed lisinopril that is not routine treatment for hypertension.  Her systolic blood pressure at home are mostly at 110s-130s (with few episode of 150s). BP today was elevated at 153/69 but she endorses having Hx of white coat elevated BP. Her BP at home is acceptable on days she was off of Lisinopril. Will DC PRN Lisinopril -DC Lisinopril -Continue low salt diet and exercise/life style modification -Checking Lipid panel today for screening -f/u in clinic in 3 months

## 2020-01-20 NOTE — Assessment & Plan Note (Signed)
Per apple watch report. Mostly at nights. Asymptomatic. (She reports infrequent episodes of mild lightheadedness but it is mostly when she stands up suddenly from sitting position and not significant.  Also not associated with the time that she is bradycardic and does not appear to be related to her bradycardia.) -Avoiding beta-blocker -No further work-up at this point -F/u in clinic in 3 months or sooner if needed

## 2020-01-20 NOTE — Patient Instructions (Signed)
Medication Instructions:  NO CHANGE *If you need a refill on your cardiac medications before your next appointment, please call your pharmacy*   Lab Work: Your physician recommends that you return for lab work in: 2 [place]  If you have labs (blood work) drawn today and your tests are completely normal, you will receive your results only by: Marland Kitchen MyChart Message (if you have MyChart) OR . A paper copy in the mail If you have any lab test that is abnormal or we need to change your treatment, we will call you to review the results.   Follow-Up: At [facility], you and your health needs are our priority.  As part of our continuing mission to provide you with exceptional heart care, we have created designated Provider Care Teams.  These Care Teams include your primary Cardiologist (physician) and Advanced Practice Providers (APPs -  Physician Assistants and Nurse Practitioners) who all work together to provide you with the care you need, when you need it.  We recommend signing up for the patient portal called "MyChart".  Sign up information is provided on this After Visit Summary.  MyChart is used to connect with patients for Virtual Visits (Telemedicine).  Patients are able to view lab/test results, encounter notes, upcoming appointments, etc.  Non-urgent messages can be sent to your provider as well.   To learn more about what you can do with MyChart, go to [email or web address].    Your next appointment:   3 month(s)  The format for your next appointment:   In Person  Provider:   Quay Burow, MD

## 2020-01-20 NOTE — Progress Notes (Signed)
01/20/2020 Kirsten Choi   1956/06/12  [id number]  Primary Physician Francesca Oman, DO Primary Cardiologist: Lorretta Harp MD Lupe Carney, [place]  HPI:  Kirsten Choi is a 64 y.o. female, Caucasian female, mother of 1 child and grandmother of 2, who presented to cardiology clinic concerning for the notifications she gets from her Apple Watch about bradycardia which mostly happens at night. She is not sure if she has symptoms with that, however she reports some occasional mild lightheadedness mostly when she stands up from sitting position.  She denies any chest pain or shortness of breath, No history of syncope or presyncope, no lower extremity edema. She intentionally lost few pounds by diet and exercise.  She had history of heart racing few months ago and underwent 2 weeks monitoring by [facility] patch which showed brief beats of SVT, PAC and PVC. She mentions that she does not feel palpitation frequently and did not receive medication for that.  She denies any fever, chills, abdominal pain, nausea or vomiting or diarrhea.   Current Meds  Medication Sig  . acetaminophen (TYLENOL) 500 MG tablet Take 1,000 mg by mouth every 6 (six) hours as needed.  Marland Kitchen lisinopril (PRINIVIL,ZESTRIL) 20 MG tablet Take 20 mg by mouth as directed.   . psyllium (METAMUCIL) 58.6 % packet Take 1 packet by mouth daily.     Allergies  Allergen Reactions  . Nickel Dermatitis  . Tape     Social History   Socioeconomic History  . Marital status: Widowed    Spouse name: Not on file  . Number of children: Not on file  . Years of education: Not on file  . Highest education level: Not on file  Occupational History  . Not on file  Tobacco Use  . Smoking status: Never Smoker  . Smokeless tobacco: Never Used  Substance and Sexual Activity  . Alcohol use: Never  . Drug use: Never  . Sexual activity: Not on file  Other Topics Concern  . Not on file  Social History Narrative  . Not on file   Social  Determinants of Health   Financial Resource Strain:   . Difficulty of Paying Living Expenses:   Food Insecurity:   . Worried About Charity fundraiser in the Last Year:   . Arboriculturist in the Last Year:   Transportation Needs:   . Film/video editor (Medical):   Marland Kitchen Lack of Transportation (Non-Medical):   Physical Activity:   . Days of Exercise per Week:   . Minutes of Exercise per Session:   Stress:   . Feeling of Stress :   Social Connections:   . Frequency of Communication with Friends and Family:   . Frequency of Social Gatherings with Friends and Family:   . Attends Religious Services:   . Active Member of Clubs or Organizations:   . Attends Archivist Meetings:   Marland Kitchen Marital Status:   Intimate Partner Violence:   . Fear of Current or Ex-Partner:   . Emotionally Abused:   Marland Kitchen Physically Abused:   . Sexually Abused:      Review of Systems: General: negative for chills, fever, night sweats or weight changes.  Cardiovascular: negative for chest pain, dyspnea on exertion, edema, orthopnea, palpitations, paroxysmal nocturnal dyspnea or shortness of breath Dermatological: negative for rash Respiratory: negative for cough or wheezing Urologic: negative for hematuria Abdominal: negative for nausea, vomiting, diarrhea, bright red blood per rectum, melena, or hematemesis  Neurologic: negative for visual changes, syncope, or dizziness All other systems reviewed and are otherwise negative except as noted above.    Blood pressure (!) 156/96, pulse 61, height 5\' 5"  (1.651 m), weight 191 lb 6.4 oz (86.8 kg).  Physical Exam  Constitutional: Well-developed and well-nourished. No acute distress.  HENT:  Head: Normocephalic and atraumatic.  Eyes: Conjunctivae are normal, EOM nl Cardiovascular:  RRR, nl S1S2, no murmur,  no LEE Respiratory: Effort normal and breath sounds normal. No respiratory distress. No wheezes.  GI: Soft. Bowel sounds are normal. No distension. There  is no tenderness.  Neurological: Is alert and oriented x 3  Skin: Not diaphoretic. No erythema.  Psychiatric:  Normal mood and affect. Behavior is normal. Judgment and thought content normal.    EKG normal sinus rhythm with heart rate of 61.  No ST-T changes  ASSESSMENT AND PLAN:   Paroxysmal SVT (supraventricular tachycardia) (HCC) Brief beats of SVT noticed on Zio patch monitoring few months ago. Patient reports none frequent episodes of heart racing back to that time but no recent episode.  Her EKG today is sinus rhythm.  She drinks 2 cups of coffee every day.  We recommended avoiding stimulant like coffee and tea as first step of managing paroxysmal SVT.  She agrees with the plan.  We will see her in clinic again in 52-month. -Recommended to taper/stop drinking coffee and other stimulant -Avoiding beta-blockers in setting of bradycardia -Checking thyroid panel -Follow-up in clinic in 3 months   Hypertension [name] is a new patient in clinic today. Patient has been on as needed lisinopril that is not routine treatment for hypertension.  Her systolic blood pressure at home are mostly at 110s-130s (with few episode of 150s). BP today was elevated at 153/69 but she endorses having Hx of white coat elevated BP. Her BP at home is acceptable on days she was off of Lisinopril. Will DC PRN Lisinopril -DC Lisinopril -f/u in clinic in 3 months     Linna Hoff MD. IM PGY-2  01/20/2020 2:43 PM  Agree with note by Dr. Myrtie Hawk   We are asked to see [name] of episodes of SVT and sinus bradycardia.  She does have a history of hypertension on lisinopril in the past which she took as needed however since losing weight her blood pressures under better control on no medications.  She does wear an apple watch and has been notified that her heart rate is slow.  She gets occasional dizziness but that is mostly orthostatic when she changes positions quickly.  She had a 2-week Zio patch  that showed episodes of PSVT with heart rates in the 170 range that were fairly self-limited and sinus bradycardia in the early a.m. hours.  I do not think she needs an antihypertensive medication currently.  I told her to discontinue her caffeine.  I will see her back in 3 months at which time we will reassess need for referral to EP.  Meantime, we will get a fasting lipid profile and thyroid function test.  Lorretta Harp, M.D., [place], [facility], [place] [address] [place], [place]  [address]  [phone] 01/20/2020 3:16 PM

## 2020-01-30 ENCOUNTER — Telehealth: Payer: Self-pay | Admitting: Cardiovascular Disease

## 2020-01-30 LAB — TSH+FREE T4
Free T4: 1.26 ng/dL (ref 0.82–1.77)
TSH: 1.38 u[IU]/mL (ref 0.450–4.500)

## 2020-01-30 LAB — HEPATIC FUNCTION PANEL
ALT: 15 IU/L (ref 0–32)
AST: 14 IU/L (ref 0–40)
Albumin: 4.5 g/dL (ref 3.8–4.8)
Alkaline Phosphatase: 96 IU/L (ref 39–117)
Bilirubin Total: 0.5 mg/dL (ref 0.0–1.2)
Bilirubin, Direct: 0.15 mg/dL (ref 0.00–0.40)
Total Protein: 6.5 g/dL (ref 6.0–8.5)

## 2020-01-30 LAB — LIPID PANEL
Chol/HDL Ratio: 2.7 ratio (ref 0.0–4.4)
Cholesterol, Total: 189 mg/dL (ref 100–199)
HDL: 69 mg/dL (ref >39)
LDL Chol Calc (NIH): 107 mg/dL — ABNORMAL HIGH (ref 0–99)
Triglycerides: 69 mg/dL (ref 0–149)
VLDL Cholesterol Cal: 13 mg/dL (ref 5–40)

## 2020-01-30 NOTE — Telephone Encounter (Signed)
Pt updated with Pharm D's recommendations and verbalized understanding.   Message sent to scheduler to arrange appointment.

## 2020-01-30 NOTE — Telephone Encounter (Signed)
Pt c/o BP issue: STAT if pt c/o blurred vision, one-sided weakness or slurred speech  1. What are your last 5 BP readings?  01/30/20 177/84 12:45 PM 162/82 8:00 AM 01/29/20 147/73 (evening) 158/80 (morning) 01/28/20 123/60 (evening) 137/74 (morning) 01/27/20 132/76 (evening) 123/77 (morning)  2. Are you having any other symptoms (ex. Dizziness, headache, blurred vision, passed out)? Dizziness   3. What is your BP issue? Kirsten Choi is calling to report her recent hypertension. She states she is having a little dizziness when this occurs. She has received her Covid vaccine and didn't know if this could have any relation to that or the medication changes made by Dr. Gwenlyn Found at her last appointment. Please advise.

## 2020-01-30 NOTE — Telephone Encounter (Signed)
Spoke with pt who report for the past couple of days she has noticed an increase in her BP. Readings listed below.   01/30/20 177/84 12:45 PM 162/82 8:00 AM 01/29/20 147/73 (evening) 158/80 (morning) 01/28/20 123/60 (evening) 137/74 (morning) 01/27/20 132/76 (evening) 123/77 (morning)  Pt report she does have anxiety and been feeling a little anxious. She denies headache, blurred vision but state she feel a little shaky. Pt report MD d/c her lisinopril on 3/16  Will forward to MD and HTN clinic for recommendations

## 2020-01-30 NOTE — Telephone Encounter (Signed)
Have her take a lisinopril tablet today (20 mg), then 1 tablet prn systolic pressure > [id number].   She needs to continue with daily home BP checks.  Put her on the CVRR schedule for about a month from now.  She will need to bring in her home meter and home readings, then we can determine how much is white coat vs actual hypertension.

## 2020-02-02 ENCOUNTER — Telehealth: Payer: Self-pay | Admitting: Cardiovascular Disease

## 2020-02-02 NOTE — Telephone Encounter (Signed)
Rockne Menghini, RPH-CPP to Meryl Crutch, RN     01/30/20 3:19 PM Note Have her take a lisinopril tablet today (20 mg), then 1 tablet prn systolic pressure > Q000111Q.   She needs to continue with daily home BP checks.  Put her on the CVRR schedule for about a month from now.  She will need to bring in her home meter and home readings, then we can determine how much is white coat vs actual hypertension.  D/W Pt she states that she received directions from PHARM-D and is questioning if Dr Gwenlyn Found wants her to see HTN clinic. She states that he specifically told her that taking the Lisinopril is not to be taken PRN. So, she is questioning that this is to be done and if Dr Gwenlyn Found knows that this is her direction. Informed pt that Dr Gwenlyn Found frequently does refer HTN pt's to the HTN clinic and she should keep appt for medication dosing. She will continue to take and log her BP and result 1 hour after taking Lisinopril. She states that her BP last night was 118/64. And today 155/76 she states that this was before taking her medication @ 730am she states that she has not taken again to see if it has went back down after taking medication, she states that she was told over the phone ONLY to take her BP BID. Informed pt that it is ok to take her BP 1 hour after taking her medication to see if her medication was effective and brought her BP down. Verbalized understanding. She states that she will send a MyChart message with her BP when she gets off the phone and takes her BP. She will continue to take and log her BP and when she is taking her PRN medication. She will come to scheduled appt with HTN clinic to review her BP/medication. She will call if her medication is ineffective and her SBP is >150.

## 2020-02-02 NOTE — Telephone Encounter (Signed)
New Message    I contacted pt to schedule appt with PharmD per staff message.  Pt wants a nurse to call her because she is concerned on why she is seeing a Pharmacist.  She also has questions about Lisinopril.    Please call

## 2020-02-03 ENCOUNTER — Emergency Department (HOSPITAL_BASED_OUTPATIENT_CLINIC_OR_DEPARTMENT_OTHER)
Admission: EM | Admit: 2020-02-03 | Discharge: 2020-02-03 | Disposition: A | Discharge status: Home / Self Care | Payer: BC Managed Care – PPO | Attending: Emergency Medicine | Admitting: Emergency Medicine

## 2020-02-03 ENCOUNTER — Other Ambulatory Visit: Payer: Self-pay

## 2020-02-03 ENCOUNTER — Telehealth: Payer: Self-pay | Admitting: Cardiovascular Disease

## 2020-02-03 ENCOUNTER — Encounter (HOSPITAL_BASED_OUTPATIENT_CLINIC_OR_DEPARTMENT_OTHER): Payer: Self-pay | Admitting: Emergency Medicine

## 2020-02-03 ENCOUNTER — Telehealth: Payer: Self-pay | Admitting: Physician Assistant

## 2020-02-03 DIAGNOSIS — R109 Unspecified abdominal pain: Secondary | ICD-10-CM | POA: Diagnosis not present

## 2020-02-03 DIAGNOSIS — I10 Essential (primary) hypertension: Secondary | ICD-10-CM | POA: Insufficient documentation

## 2020-02-03 DIAGNOSIS — Z79899 Other long term (current) drug therapy: Secondary | ICD-10-CM | POA: Diagnosis not present

## 2020-02-03 DIAGNOSIS — Z853 Personal history of malignant neoplasm of breast: Secondary | ICD-10-CM | POA: Diagnosis not present

## 2020-02-03 LAB — URINALYSIS, MICROSCOPIC (REFLEX)

## 2020-02-03 LAB — COMPREHENSIVE METABOLIC PANEL
ALT: 17 U/L (ref 0–44)
AST: 14 U/L — ABNORMAL LOW (ref 15–41)
Albumin: 4.7 g/dL (ref 3.5–5.0)
Alkaline Phosphatase: 81 U/L (ref 38–126)
Anion gap: 10 (ref 5–15)
BUN: 13 mg/dL (ref 8–23)
CO2: 24 mmol/L (ref 22–32)
Calcium: 9.6 mg/dL (ref 8.9–10.3)
Chloride: 105 mmol/L (ref 98–111)
Creatinine, Ser: 0.45 mg/dL (ref 0.44–1.00)
GFR calc Af Amer: >60 mL/min (ref >60)
GFR calc non Af Amer: >60 mL/min (ref >60)
Glucose, Bld: 96 mg/dL (ref 70–99)
Potassium: 4 mmol/L (ref 3.5–5.1)
Sodium: 139 mmol/L (ref 135–145)
Total Bilirubin: 0.7 mg/dL (ref 0.3–1.2)
Total Protein: 7.3 g/dL (ref 6.5–8.1)

## 2020-02-03 LAB — CBC WITH DIFFERENTIAL/PLATELET
Abs Immature Granulocytes: 0.01 10*3/uL (ref 0.00–0.07)
Basophils Absolute: 0 10*3/uL (ref 0.0–0.1)
Basophils Relative: 1 %
Eosinophils Absolute: 0 10*3/uL (ref 0.0–0.5)
Eosinophils Relative: 0 %
HCT: 47.8 % — ABNORMAL HIGH (ref 36.0–46.0)
Hemoglobin: 15.3 g/dL — ABNORMAL HIGH (ref 12.0–15.0)
Immature Granulocytes: 0 %
Lymphocytes Relative: 13 %
Lymphs Abs: 0.8 10*3/uL (ref 0.7–4.0)
MCH: 28.7 pg (ref 26.0–34.0)
MCHC: 32 g/dL (ref 30.0–36.0)
MCV: 89.7 fL (ref 80.0–100.0)
Monocytes Absolute: 0.4 10*3/uL (ref 0.1–1.0)
Monocytes Relative: 7 %
Neutro Abs: 4.8 10*3/uL (ref 1.7–7.7)
Neutrophils Relative %: 79 %
Platelets: 283 10*3/uL (ref 150–400)
RBC: 5.33 MIL/uL — ABNORMAL HIGH (ref 3.87–5.11)
RDW: 12.4 % (ref 11.5–15.5)
WBC: 6.1 10*3/uL (ref 4.0–10.5)
nRBC: 0 % (ref 0.0–0.2)

## 2020-02-03 LAB — URINALYSIS, ROUTINE W REFLEX MICROSCOPIC
Bilirubin Urine: NEGATIVE
Glucose, UA: NEGATIVE mg/dL
Ketones, ur: NEGATIVE mg/dL
Leukocytes,Ua: NEGATIVE
Nitrite: NEGATIVE
Protein, ur: NEGATIVE mg/dL
Specific Gravity, Urine: <1.005 — ABNORMAL LOW (ref 1.005–1.030)
pH: 6.5 (ref 5.0–8.0)

## 2020-02-03 LAB — LIPASE, BLOOD: Lipase: 39 U/L (ref 11–51)

## 2020-02-03 NOTE — ED Notes (Signed)
Pt discharged to home. Discharge instructions have been discussed with patient and/or family members. Pt verbally acknowledges understanding d/c instructions, and states understanding to checkout at registration before leaving.

## 2020-02-03 NOTE — Discharge Instructions (Addendum)
If you develop worsening, continued, or recurrent abdominal pain, uncontrolled vomiting, fever, chest or back pain, or any other new/concerning symptoms then return to the ER for evaluation.  

## 2020-02-03 NOTE — Telephone Encounter (Signed)
  Ms. Nedrow called because her blood pressure has been very high this evening and she is concerned about it.  Her current blood pressure is 183/97.  She has been getting headaches, and feels these are related to her high blood pressure.  Blood pressure has been high enough in the evenings, that she started back taking lisinopril 30 mg this morning.  Her blood pressure was better controlled today, but is going high this evening.  What to do?  I explained that the maximum dose of lisinopril was 40 mg daily.  She is going to cut one of the 30 mg tablets in half and take that, that we will give her a total of 45 mg today only.  I suggested that she go back on the lisinopril 20 mg twice daily.  If her blood pressure is well controlled in the evening and she wants to skip the p.m. dose that is up to her.  She was also on HCTZ 12.5 mg daily at 1 point, still has some pills left.  I explained that she could also take that if her blood pressure was not coming down after taking the extra lisinopril tablet.  Advised that if her blood pressure did not come down with those medications, she should call us back.  She repeated the instructions back to me, and says she will call back if she has any additional questions.  I emphasized that she needs to keep her appointment at the hypertension clinic.  I will route this to the hypertension clinic in case she needs to be seen sooner.  Rosaria Ferries, PA-C 02/03/2020 7:49 PM

## 2020-02-03 NOTE — ED Notes (Signed)
ED Provider at bedside. 

## 2020-02-03 NOTE — ED Triage Notes (Signed)
Pt states she has been having recent spikes in her blood pressure since Friday  Pt states she has been talking with her doctor  Pt also states she has been having urinary frequency  Pt states she has been taking lisinopril 20 mg in the morning and she took a extra dose last night  Pt states something is going on in her GI region as well and she feels like that is the cause of it

## 2020-02-03 NOTE — Telephone Encounter (Signed)
Kirsten Choi is calling due to being seen in the ED and being advising her to call Dr. Kennon Holter office for management of blood pressure. She states she would like Kirsten Choi to call her back in regards to this. Please advise.

## 2020-02-03 NOTE — Telephone Encounter (Signed)
Spoke with patient. Patient had to go to the emergency room this morning due to htn 199/111 with symptoms. Patient had a splitting headache and anxiety.Patient was discharged and advised to follow up with cardiologist and PCP. Spoke with Tommy Medal and scheduled for 4/6 at 11am. Patient to follow up with PCP before hypertension appointment. Patient instructed on when to report to the ER or when to call cardiology office in the meantime. Patient verbalized understanding.

## 2020-02-03 NOTE — ED Provider Notes (Signed)
[facility] EMERGENCY DEPARTMENT Provider Note   CSN: [id number] Arrival date & time: 02/03/20  0551     History Chief Complaint  Patient presents with  . Hypertension  . Abdominal Pain    Kirsten Choi is a 64 y.o. female.  HPI 64 year old female presents with a chief complaint of hypertension.  Recently had her blood pressure medicine stopped because of better blood pressures.  However over the last few days she has been noticing higher blood pressures, up to [id number] systolic, and last night got a headache.  Right-sided headache that is better now.  Feels like prior headaches she gets with high blood pressure.  No chest pain.  Also wonders if GI issues are playing a role as for the past month or more she has been having on and off constipation and increased bowel movements.  Was put on Metamucil.  Cannot take MiraLAX and her doctor told her not to take milk of magnesia.  No blood in her stool.  No dysuria.  No fevers, vomiting.  Yesterday took her blood pressure medicine lisinopril in the morning and evening. Abdominal pain is lower and feels like a cramping. Seems to come and go.   Past Medical History:  Diagnosis Date  . Arthritis   . Cancer ([place])    breast CA (resolved)  . Hypertension   . Migraine     Patient Active Problem List   Diagnosis Date Noted  . Paroxysmal SVT (supraventricular tachycardia) ([place]) 01/20/2020  . Bradycardia     Past Surgical History:  Procedure Laterality Date  . BREAST SURGERY    . HERNIA REPAIR    . REPLACEMENT TOTAL KNEE    . WRIST SURGERY       OB History   No obstetric history on file.     Family History  Problem Relation Age of Onset  . Leukemia Mother   . Stroke Father   . CAD Father   . Cancer Father     Social History   Tobacco Use  . Smoking status: Never Smoker  . Smokeless tobacco: Never Used  Substance Use Topics  . Alcohol use: Never  . Drug use: Never    Home Medications Prior to Admission  medications   Medication Sig Start Date End Date Taking? Authorizing Provider  lisinopril (ZESTRIL) 20 MG tablet Take 20 mg by mouth at bedtime as needed.   Yes [provider]  acetaminophen (TYLENOL) 500 MG tablet Take 1,000 mg by mouth every 6 (six) hours as needed.    [provider]  psyllium (METAMUCIL) 58.6 % packet Take 1 packet by mouth daily.    [provider]    Allergies    Nickel and Tape  Review of Systems   Review of Systems  Constitutional: Negative for fever.  Eyes: Negative for visual disturbance.  Cardiovascular: Negative for chest pain.  Gastrointestinal: Positive for abdominal pain. Negative for vomiting.  Genitourinary: Negative for dysuria.  Neurological: Positive for headaches.  All other systems reviewed and are negative.   Physical Exam Updated Vital Signs BP (!) 199/111 (BP Location: Left Arm)   Pulse 65   Temp 97.8 F (36.6 C) (Oral)   Resp 20   Ht 5\' 5"  (1.651 m)   Wt 82.6 kg   SpO2 100%   BMI 30.29 kg/m   Physical Exam Vitals and nursing note reviewed.  Constitutional:      Appearance: She is well-developed. She is obese.  HENT:  Head: Normocephalic and atraumatic.     Right Ear: External ear normal.     Left Ear: External ear normal.     Nose: Nose normal.  Eyes:     General:        Right eye: No discharge.        Left eye: No discharge.  Cardiovascular:     Rate and Rhythm: Normal rate and regular rhythm.     Heart sounds: Normal heart sounds.  Pulmonary:     Effort: Pulmonary effort is normal.     Breath sounds: Normal breath sounds.  Abdominal:     Palpations: Abdomen is soft.     Tenderness: There is no abdominal tenderness.  Skin:    General: Skin is warm and dry.  Neurological:     Mental Status: She is alert.  Psychiatric:        Mood and Affect: Mood is not anxious.     ED Results / Procedures / Treatments   Labs (all labs ordered are listed, but only abnormal results are  displayed) Labs Reviewed  URINALYSIS, ROUTINE W REFLEX MICROSCOPIC - Abnormal; Notable for the following components:      Result Value   Specific Gravity, Urine <1.005 (*)    Hgb urine dipstick TRACE (*)    All other components within normal limits  COMPREHENSIVE METABOLIC PANEL - Abnormal; Notable for the following components:   AST 14 (*)    All other components within normal limits  CBC WITH DIFFERENTIAL/PLATELET - Abnormal; Notable for the following components:   RBC 5.33 (*)    Hemoglobin 15.3 (*)    HCT 47.8 (*)    All other components within normal limits  URINALYSIS, MICROSCOPIC (REFLEX) - Abnormal; Notable for the following components:   Bacteria, UA FEW (*)    All other components within normal limits  LIPASE, BLOOD    EKG None  Radiology No results found.  Procedures Procedures (including critical care time)  Medications Ordered in ED Medications - No data to display  ED Course  I have reviewed the triage vital signs and the nursing notes.  Pertinent labs & imaging results that were available during my care of the patient were reviewed by me and considered in my medical decision making (see chart for details).    MDM Rules/Calculators/A&P                      Patient is well-appearing here.  Labs are stable.  No acute kidney injury or significant LFT dysfunction.  WBC normal.  No anginal symptoms or severe headache to suggest endorgan damage.  Headache has occurred many times with similar blood pressures per her.  At this point, I think she is having recurrent hypertension and needs a follow-up with her cardiologist.  From a diarrhea/constipation standpoint, she has been having this for a while and I recommended she add Colace.  Follow-up with PCP.  Abdominal exam shows no tenderness now and no significant tenderness/pain at home to suggest that she has an emergent abdominal cause requiring CT or possibly surgery.  Discharged home with return precautions. Final  Clinical Impression(s) / ED Diagnoses Final diagnoses:  Essential hypertension    Rx / DC Orders ED Discharge Orders    None       Sherwood Gambler, MD 02/03/20 [phone]

## 2020-02-04 ENCOUNTER — Encounter: Payer: Self-pay | Admitting: Cardiovascular Disease

## 2020-02-04 ENCOUNTER — Telehealth: Payer: Self-pay | Admitting: Physician Assistant

## 2020-02-04 ENCOUNTER — Encounter (HOSPITAL_COMMUNITY): Payer: Self-pay

## 2020-02-04 ENCOUNTER — Telehealth: Payer: Self-pay | Admitting: Cardiovascular Disease

## 2020-02-04 ENCOUNTER — Emergency Department (HOSPITAL_COMMUNITY)
Admission: EM | Admit: 2020-02-04 | Discharge: 2020-02-04 | Disposition: A | Discharge status: Home / Self Care | Payer: BC Managed Care – PPO | Attending: Emergency Medicine | Admitting: Emergency Medicine

## 2020-02-04 DIAGNOSIS — K5900 Constipation, unspecified: Secondary | ICD-10-CM | POA: Insufficient documentation

## 2020-02-04 DIAGNOSIS — R300 Dysuria: Secondary | ICD-10-CM | POA: Diagnosis not present

## 2020-02-04 DIAGNOSIS — Z5321 Procedure and treatment not carried out due to patient leaving prior to being seen by health care provider: Secondary | ICD-10-CM | POA: Insufficient documentation

## 2020-02-04 DIAGNOSIS — R339 Retention of urine, unspecified: Secondary | ICD-10-CM | POA: Diagnosis not present

## 2020-02-04 LAB — URINALYSIS, ROUTINE W REFLEX MICROSCOPIC
Bilirubin Urine: NEGATIVE
Glucose, UA: NEGATIVE mg/dL
Hgb urine dipstick: NEGATIVE
Ketones, ur: 5 mg/dL — AB
Leukocytes,Ua: NEGATIVE
Nitrite: NEGATIVE
Protein, ur: NEGATIVE mg/dL
Specific Gravity, Urine: 1.006 (ref 1.005–1.030)
pH: 6 (ref 5.0–8.0)

## 2020-02-04 MED ORDER — HYDROCHLOROTHIAZIDE 25 MG PO TABS: 25.0000 mg | ORAL_TABLET | Freq: Every day | ORAL | 1 refills | Status: DC

## 2020-02-04 NOTE — Telephone Encounter (Signed)
Can you please review if any other changes should be made? Patient called on call provider- spoke with Suanne Marker, she gave her some options for medication changes, but patient's blood pressure is still elevated this morning.   Thank you!

## 2020-02-04 NOTE — Telephone Encounter (Signed)
Thanks Suanne Marker,  This is Dr. Kennon Holter patient.  We are happy to see her if he thinks this would be helpful.  I'm away the rest of this week but can see her next week.

## 2020-02-04 NOTE — Telephone Encounter (Signed)
Contacted patient- advised of message from PharmD. Called in RX to pharmacy.  Patient verbalized understanding.

## 2020-02-04 NOTE — Telephone Encounter (Signed)
New message   Pt c/o BP issue: STAT if pt c/o blurred vision, one-sided weakness or slurred speech  1. What are your last 5 BP readings? 192/86 171/85   2. Are you having any other symptoms (ex. Dizziness, headache, blurred vision, passed out)? headache  3. What is your BP issue? Patient states that her b/p is elevated.

## 2020-02-04 NOTE — Telephone Encounter (Signed)
Pt called stating she increased her HCTZ to 25 mg as instructed and took lisinopril. Her pressure continues ot increase to [id number] systolic. She is hydrating well, but has not been able to void in the setting of increased  Diuretic. I discussed the possibility of AKI +/- post-renal obstruction and advised her to go to the ER. She expressed understanding of the plan.

## 2020-02-04 NOTE — Telephone Encounter (Signed)
Patient calling back stating her BP is still high. She states this morning it is 192/86. She states her only symptom is a headache. Please advise.

## 2020-02-04 NOTE — ED Triage Notes (Addendum)
Pt arrives POV for eval of urinary retention. Pt reports recent adjustment to her fluid pills since yesterday and states as though she cannot urinate since that time. Reports last urination at 1530, but doesn't feel as though she completely emptied. Pt reports ongoing constipation intermittent x 1 month. States she feels as though she is straining so hard to have BM that she cannot pee. Endorses dysuria

## 2020-02-04 NOTE — ED Notes (Signed)
Pt states she Is leaving and will follow up with her pcp tomorrow.

## 2020-02-04 NOTE — Telephone Encounter (Signed)
error 

## 2020-02-04 NOTE — Telephone Encounter (Signed)
Recommendations:  1. Increase lisinopril to 40mg  daily as recommended by Suanne Marker 2. ADD HCTZ 25mg  daily (2 tablets of HCTZ 12.5mg ) 3. Increase hydration  4. Avoid take-out meals, canned products and high sodium food  * Medication will take few days to show full effect. Continue current medication and monitor BP daily* Keep appointment with HTN clinic for next week as scheduled*

## 2020-02-08 NOTE — Telephone Encounter (Signed)
Have her keep a BP log daily for a month then see either Kristen or Racquel in the office after that to review

## 2020-02-09 NOTE — Telephone Encounter (Signed)
Spoke to patient Dr.Berry's recommendation given.Stated her B/P was better this morning 129/80.Stated she has appointment with pharmacist tomorrow 4/6 at 11:00 am.Stated she wanted to let Dr.Berry know she appreciated him checking on her.I will let him know.

## 2020-02-10 ENCOUNTER — Ambulatory Visit (INDEPENDENT_AMBULATORY_CARE_PROVIDER_SITE_OTHER): Payer: BC Managed Care – PPO | Admitting: Pharmacist

## 2020-02-10 ENCOUNTER — Other Ambulatory Visit: Payer: Self-pay

## 2020-02-10 VITALS — BP 118/72 | HR 56 | Ht 65.0 in | Wt 181.4 lb

## 2020-02-10 DIAGNOSIS — I10 Essential (primary) hypertension: Secondary | ICD-10-CM

## 2020-02-10 MED ORDER — HYDROCHLOROTHIAZIDE 25 MG PO TABS: 25.0000 mg | ORAL_TABLET | Freq: Every day | ORAL | 1 refills | Status: DC | PRN

## 2020-02-10 NOTE — Progress Notes (Signed)
Patient ID: Raelin Deleo                 DOB: 1956/06/05                      MRN: [id number]     HPI: Cloey Hessman is a 64 y.o. female referred by Dr. Gwenlyn Found to HTN clinic. PMH includes bradycardia, SVT, PAC ,PVC, migraines(only with elevated BP), and  hypertension. Noted patient was on lisinopril and HCTZ in the past but medication was stopped after improvement in blood pressure. BP medication was adjusted on 02/06/2020 by PCP. Started positive lifestyle modification in January and reports 25 pound weight loss .  Denies swelling, chest pain, denies headaches, but continues to struggle with constipation. She noted increased in BP after eating out for the fist time in 3 months and not watching her sodium intake.  Current HTN meds:  Lisinopril 40mg  daily; patient self adjusted to 20mg  daily after low BP reading last night. HCTZ 12.5mg  daily - last dose   Previously tried:  Lisinopril 30mg  daily Metoprolol 12.5mg  - recommended after Zio monitor but patient refused  BP goal: 130/80  Family History: MI and stroke in father; HTN in bother and sister  Social History: no coffee,   Diet: bistro[employer] high protein (heart healthy option), eats 2 prunes and metamucil.  Exercise: walks 3 miles every morning, and walks dog as well  Home BP readings:  12 readings; average 128/74; HR 51-67  Wt Readings from Last 3 Encounters:  02/10/20 181 lb 6.4 oz (82.3 kg)  02/04/20 180 lb 12.4 oz (82 kg)  02/03/20 182 lb (82.6 kg)   BP Readings from Last 3 Encounters:  02/10/20 118/72  02/04/20 (!) 154/80  02/03/20 (!) 160/82   Pulse Readings from Last 3 Encounters:  02/10/20 (!) 56  02/04/20 62  02/03/20 (!) 59    Renal function: Estimated Creatinine Clearance: 75.3 mL/min (by C-G formula based on SCr of 0.45 mg/dL).  Past Medical History:  Diagnosis Date  . Arthritis   . Cancer ([employer])    breast CA (resolved)  . Hypertension   . Migraine     Current Outpatient Medications on File Prior to Visit    Medication Sig Dispense Refill  . acetaminophen (TYLENOL) 500 MG tablet Take 1,000 mg by mouth every 6 (six) hours as needed.    Marland Kitchen lisinopril (ZESTRIL) 20 MG tablet Take 20 mg by mouth at bedtime as needed.    . psyllium (METAMUCIL) 58.6 % packet Take 1 packet by mouth daily.     No current facility-administered medications on file prior to visit.    Allergies  Allergen Reactions  . Nickel Dermatitis  . Tape     Blood pressure 118/72, pulse (!) 56, height 5\' 5"  (1.651 m), weight 181 lb 6.4 oz (82.3 kg), SpO2 98 %.  Hypertension Blood pressure is at goal today and patient continues to follow low sodium diet. We spent about 20 minutes discussing reasons for BP variability and need to take lisinopril daily. She was also concern about "low blood pressure" in the 100s. Her BP increased after stopping lisinopril and eating high sodium diet.   Will change HCTZ to 25mg  daily as NEEDED for BP above 140 after high sodium intake and continue lisinopril at 20mg  daily instead of 40mg . Patient will repeat BMET in 2 weeks and follow up with HTN clinic in 4 weeks. Plan to decrease lisinopril further if needed avoid discontinuation.   Ryosuke Ericksen Rodriguez-Guzman  PharmD, BCPS, [facility] [address] [place] [address] 02/12/2020 3:01 PM

## 2020-02-10 NOTE — Patient Instructions (Addendum)
Return for a  follow up appointment in 4 weeks   Go to the lab in 2 WEEKS  Check your blood pressure at home daily (if able) and keep record of the readings.  Take your BP meds as follows: *DECREASE lisinopril to 20mg  daily - start 02/11/2020* *TAKE HCTZ 25mg  daily ONLY as needed if blood pressure above 140 due to high sodium intake*  Bring all of your meds, your BP cuff and your record of home blood pressures to your next appointment.  Exercise as you're able, try to walk approximately 30 minutes per day.  Keep salt intake to a minimum, especially watch canned and prepared boxed foods.  Eat more fresh fruits and vegetables and fewer canned items.  Avoid eating in fast food restaurants.    HOW TO TAKE YOUR BLOOD PRESSURE: . Rest 5 minutes before taking your blood pressure. .  Don't smoke or drink caffeinated beverages for at least 30 minutes before. . Take your blood pressure before (not after) you eat. . Sit comfortably with your back supported and both feet on the floor (don't cross your legs). . Elevate your arm to heart level on a table or a desk. . Use the proper sized cuff. It should fit smoothly and snugly around your bare upper arm. There should be enough room to slip a fingertip under the cuff. The bottom edge of the cuff should be 1 inch above the crease of the elbow. . Ideally, take 3 measurements at one sitting and record the average.

## 2020-02-12 ENCOUNTER — Telehealth: Payer: Self-pay

## 2020-02-12 ENCOUNTER — Encounter: Payer: Self-pay | Admitting: Pharmacist

## 2020-02-12 NOTE — Telephone Encounter (Signed)
Spoke to patient Dr.Berry advised to keep a B/P log for 30 days and bring readings to appointment scheduled with pharmacy 5/11 at 11:00 am.Keep appointment scheduled with Dr.Berry 6/18 at 1:30 pm.

## 2020-02-12 NOTE — Assessment & Plan Note (Signed)
Blood pressure is at goal today and patient continues to follow low sodium diet. We spent about 20 minutes discussing reasons for BP variability and need to take lisinopril daily. She was also concern about "low blood pressure" in the 100s. Her BP increased after stopping lisinopril and eating high sodium diet.   Will change HCTZ to 25mg  daily as NEEDED for BP above 140 after high sodium intake and continue lisinopril at 20mg  daily instead of 40mg . Patient will repeat BMET in 2 weeks and follow up with HTN clinic in 4 weeks. Plan to decrease lisinopril further if needed avoid discontinuation.

## 2020-02-27 LAB — BASIC METABOLIC PANEL
BUN/Creatinine Ratio: 17 (ref 12–28)
BUN: 16 mg/dL (ref 8–27)
CO2: 27 mmol/L (ref 20–29)
Calcium: 9.7 mg/dL (ref 8.7–10.3)
Chloride: 104 mmol/L (ref 96–106)
Creatinine, Ser: 0.94 mg/dL (ref 0.57–1.00)
GFR calc Af Amer: 74 mL/min/{1.73_m2} (ref >59)
GFR calc non Af Amer: 64 mL/min/{1.73_m2} (ref >59)
Glucose: 64 mg/dL — ABNORMAL LOW (ref 65–99)
Potassium: 4.7 mmol/L (ref 3.5–5.2)
Sodium: 145 mmol/L — ABNORMAL HIGH (ref 134–144)

## 2020-03-04 ENCOUNTER — Other Ambulatory Visit: Payer: Self-pay | Admitting: Orthopedic Surgery

## 2020-03-04 ENCOUNTER — Other Ambulatory Visit (HOSPITAL_COMMUNITY): Payer: Self-pay | Admitting: Orthopedic Surgery

## 2020-03-04 ENCOUNTER — Ambulatory Visit: Payer: BC Managed Care – PPO

## 2020-03-04 DIAGNOSIS — M25562 Pain in left knee: Secondary | ICD-10-CM

## 2020-03-16 ENCOUNTER — Ambulatory Visit: Payer: BC Managed Care – PPO

## 2020-03-23 ENCOUNTER — Other Ambulatory Visit: Payer: Self-pay

## 2020-03-23 ENCOUNTER — Ambulatory Visit (INDEPENDENT_AMBULATORY_CARE_PROVIDER_SITE_OTHER): Payer: BC Managed Care – PPO | Admitting: Pharmacist

## 2020-03-23 VITALS — BP 128/80 | HR 60

## 2020-03-23 DIAGNOSIS — I10 Essential (primary) hypertension: Secondary | ICD-10-CM | POA: Diagnosis not present

## 2020-03-23 NOTE — Progress Notes (Signed)
Patient ID: [name]                 DOB: September 28, 1956                      MRN: [id number]     HPI: Kirsten Choi is a 64 y.o. female referred by Dr. Gwenlyn Choi to HTN clinic. PMH includes bradycardia, SVT, PAC ,PVC, migraines(only with elevated BP), and  hypertension. Noted patient was on lisinopril and HCTZ in the past, but medication was stopped after improvement in blood pressure. Started positive lifestyle modification in January and reports 25 pound weight loss. Patient is currently taking lisinopril 20mg  daily and HCTZ 25mg  as needed. Denies problems with headaches, swelling or dizziness. Reports increased pain on her knee d/y arthitis and increased physical activity.    Current HTN meds:  Lisinopril 20mg  daily HCTZ 25mg  daily - take as needed  Previously tried:  Lisinopril 30mg  daily Metoprolol 12.5mg  - recommended after Zio monitor but patient refused  BP goal: 130/80  Family History: MI and stroke in father; HTN in bother and sister  Social History: no coffee,   Diet: bist[employer] high protein (heart healthy option), eats 2 prunes and metamucil.  Exercise: walks 3 miles every morning, and walks dog as well  Home BP readings:  25 readings; average 132/72; HR range 48-75bpm  Wt Readings from Last 3 Encounters:  02/10/20 181 lb 6.4 oz (82.3 kg)  02/04/20 180 lb 12.4 oz (82 kg)  02/03/20 182 lb (82.6 kg)   BP Readings from Last 3 Encounters:  03/23/20 128/80  02/10/20 118/72  02/04/20 (!) 154/80   Pulse Readings from Last 3 Encounters:  03/23/20 60  02/10/20 (!) 56  02/04/20 62    Past Medical History:  Diagnosis Date  . Arthritis   . Cancer ([place])    breast CA (resolved)  . Hypertension   . Migraine     Current Outpatient Medications on File Prior to Visit  Medication Sig Dispense Refill  . acetaminophen (TYLENOL) 500 MG tablet Take 1,000 mg by mouth every 6 (six) hours as needed.    . hydrochlorothiazide (HYDRODIURIL) 25 MG tablet Take 1 tablet (25 mg total) by  mouth daily as needed (for blood pressure above 140). 90 tablet 1  . lisinopril (ZESTRIL) 20 MG tablet Take 20 mg by mouth at bedtime as needed.    . psyllium (METAMUCIL) 58.6 % packet Take 1 packet by mouth daily.     No current facility-administered medications on file prior to visit.    Allergies  Allergen Reactions  . Nickel Dermatitis  . Tape     Blood pressure 128/80, pulse 60, SpO2 97 %.  Hypertension Blood pressure at goa during OV and well controlled at home. Will continue current regimend withou changes and follow up as needed.   Kirsten Choi PharmD, BCPS, [place] [address] 03/29/2020 3:04 PM

## 2020-03-23 NOTE — Patient Instructions (Addendum)
Return for a follow up appointment AS NEEDED  Check your blood pressure at home daily (if able) and keep record of the readings.  Take your BP meds as follows: *NO MEDICATION CHANGE*  Bring all of your meds, your BP cuff and your record of home blood pressures to your next appointment.  Exercise as you're able, try to walk approximately 30 minutes per day.  Keep salt intake to a minimum, especially watch canned and prepared boxed foods.  Eat more fresh fruits and vegetables and fewer canned items.  Avoid eating in fast food restaurants.    HOW TO TAKE YOUR BLOOD PRESSURE: . Rest 5 minutes before taking your blood pressure. .  Don't smoke or drink caffeinated beverages for at least 30 minutes before. . Take your blood pressure before (not after) you eat. . Sit comfortably with your back supported and both feet on the floor (don't cross your legs). . Elevate your arm to heart level on a table or a desk. . Use the proper sized cuff. It should fit smoothly and snugly around your bare upper arm. There should be enough room to slip a fingertip under the cuff. The bottom edge of the cuff should be 1 inch above the crease of the elbow. . Ideally, take 3 measurements at one sitting and record the average.

## 2020-03-29 ENCOUNTER — Encounter: Payer: Self-pay | Admitting: Pharmacist

## 2020-03-29 NOTE — Assessment & Plan Note (Signed)
Blood pressure at [place] during OV and well controlled at home. Will continue current regimend withou changes and follow up as needed.

## 2020-04-23 ENCOUNTER — Ambulatory Visit: Payer: BC Managed Care – PPO | Admitting: Cardiovascular Disease

## 2020-06-22 ENCOUNTER — Ambulatory Visit: Payer: BC Managed Care – PPO | Admitting: Cardiovascular Disease

## 2020-09-04 ENCOUNTER — Emergency Department (HOSPITAL_BASED_OUTPATIENT_CLINIC_OR_DEPARTMENT_OTHER): Payer: Medicare Other

## 2020-09-04 ENCOUNTER — Other Ambulatory Visit: Payer: Self-pay

## 2020-09-04 ENCOUNTER — Emergency Department (HOSPITAL_BASED_OUTPATIENT_CLINIC_OR_DEPARTMENT_OTHER)
Admission: EM | Admit: 2020-09-04 | Discharge: 2020-09-04 | Disposition: A | Discharge status: Home / Self Care | Payer: Medicare Other | Attending: Emergency Medicine | Admitting: Emergency Medicine

## 2020-09-04 ENCOUNTER — Encounter (HOSPITAL_BASED_OUTPATIENT_CLINIC_OR_DEPARTMENT_OTHER): Payer: Self-pay | Admitting: Emergency Medicine

## 2020-09-04 DIAGNOSIS — R61 Generalized hyperhidrosis: Secondary | ICD-10-CM | POA: Insufficient documentation

## 2020-09-04 DIAGNOSIS — R251 Tremor, unspecified: Secondary | ICD-10-CM | POA: Insufficient documentation

## 2020-09-04 DIAGNOSIS — Z96659 Presence of unspecified artificial knee joint: Secondary | ICD-10-CM | POA: Diagnosis not present

## 2020-09-04 DIAGNOSIS — I10 Essential (primary) hypertension: Secondary | ICD-10-CM | POA: Diagnosis not present

## 2020-09-04 DIAGNOSIS — R42 Dizziness and giddiness: Secondary | ICD-10-CM | POA: Insufficient documentation

## 2020-09-04 DIAGNOSIS — Z853 Personal history of malignant neoplasm of breast: Secondary | ICD-10-CM | POA: Insufficient documentation

## 2020-09-04 LAB — URINALYSIS, ROUTINE W REFLEX MICROSCOPIC
Bilirubin Urine: NEGATIVE
Glucose, UA: NEGATIVE mg/dL
Hgb urine dipstick: NEGATIVE
Ketones, ur: NEGATIVE mg/dL
Leukocytes,Ua: NEGATIVE
Nitrite: NEGATIVE
Protein, ur: NEGATIVE mg/dL
Specific Gravity, Urine: <1.005 — ABNORMAL LOW (ref 1.005–1.030)
pH: 6 (ref 5.0–8.0)

## 2020-09-04 LAB — BASIC METABOLIC PANEL
Anion gap: 11 (ref 5–15)
BUN: 17 mg/dL (ref 8–23)
CO2: 21 mmol/L — ABNORMAL LOW (ref 22–32)
Calcium: 9.3 mg/dL (ref 8.9–10.3)
Chloride: 104 mmol/L (ref 98–111)
Creatinine, Ser: 0.69 mg/dL (ref 0.44–1.00)
GFR, Estimated: >60 mL/min (ref >60)
Glucose, Bld: 96 mg/dL (ref 70–99)
Potassium: 5.1 mmol/L (ref 3.5–5.1)
Sodium: 136 mmol/L (ref 135–145)

## 2020-09-04 LAB — CBC
HCT: 48.3 % — ABNORMAL HIGH (ref 36.0–46.0)
Hemoglobin: 15.7 g/dL — ABNORMAL HIGH (ref 12.0–15.0)
MCH: 28.7 pg (ref 26.0–34.0)
MCHC: 32.5 g/dL (ref 30.0–36.0)
MCV: 88.3 fL (ref 80.0–100.0)
Platelets: 261 10*3/uL (ref 150–400)
RBC: 5.47 MIL/uL — ABNORMAL HIGH (ref 3.87–5.11)
RDW: 12.3 % (ref 11.5–15.5)
WBC: 9 10*3/uL (ref 4.0–10.5)
nRBC: 0 % (ref 0.0–0.2)

## 2020-09-04 LAB — CBG MONITORING, ED: Glucose-Capillary: 111 mg/dL — ABNORMAL HIGH (ref 70–99)

## 2020-09-04 MED ORDER — MECLIZINE HCL 25 MG PO TABS: 25.0000 mg | ORAL_TABLET | Freq: Two times a day (BID) | ORAL | 0 refills | Status: DC | PRN

## 2020-09-04 MED ORDER — IOHEXOL 350 MG/ML SOLN
100.0000 mL | Freq: Once | INTRAVENOUS | Status: AC | PRN
  Administered 2020-09-04: 100 mL via INTRAVENOUS

## 2020-09-04 MED ORDER — MECLIZINE HCL 25 MG PO TABS
25.0000 mg | ORAL_TABLET | Freq: Once | ORAL | Status: AC
  Administered 2020-09-04: 25 mg via ORAL
  Filled 2020-09-04: qty 1

## 2020-09-04 NOTE — ED Provider Notes (Signed)
Patient signed out to me awaiting MRI to rule out stroke.  Suspect peripheral vertigo.   MRI negative for stroke.  Given prescription for meclizine.  Has already been following with ENT.  Understands return precautions.  Discharged in good condition.   Lennice Sites, DO 09/04/20 1858

## 2020-09-04 NOTE — ED Notes (Signed)
Still waiting for MRI.  Staff aware.

## 2020-09-04 NOTE — ED Notes (Signed)
Pt provided with water per request and OK from Dr. Billy Fischer

## 2020-09-04 NOTE — ED Triage Notes (Signed)
Pt c/o dizziness since beginning of October. Pt seen by ENT doctor and was told it was vertigo. Pt reports now having shakiness onset yesterday after acupuncture treatment. Pt reports also having anxiety and panic attacks that interrupt her sleep.  Today patient reports woke up with gait issues.

## 2020-09-04 NOTE — ED Provider Notes (Signed)
[facility] Provider Note   CSN: [id number] Arrival date & time: 09/04/20  9735     History Chief Complaint  Patient presents with  . Dizziness    Kirsten Choi is a 64 y.o. female.  HPI      Dizziness yesterday before lunch, more significant episode Was getting treatment for difficulty sleeping, waking up in middle of the night with anxiety and thoughts racing, went to see acupuncturist regarding this Diaphoresis and shivering with no fever Dizziness this AM had a difficult time getting across the house  Describes dizziness as off balance.  Has had some lightheadedness but more off balance.  Just started a month ago, woke up in the middle of the night with it and tried to get up and it was like a magnet that pulled her down. Had an episode while at ENT and diagnosed with vertigo - had eye movements.  Was doing exercises and seemed improved then yesterday it came back again. Not sure if when laying down on table triggered it.  Yesterday felt shaky, sweaty, lightheaded and this morning felt that too but also felt off balance and unable to walk - afraid was going to fall. Was severe when woke up and then improved but still feeling lightheaded and slight headache  Denies numbness, weakness, difficulty talking, visual changes or facial droop.  Loose stools and nausea today   Past Medical History:  Diagnosis Date  . Arthritis   . Cancer ([place])    breast CA (resolved)  . Hypertension   . Migraine     Patient Active Problem List   Diagnosis Date Noted  . Paroxysmal SVT (supraventricular tachycardia) ([place]) 01/20/2020  . Hypertension   . Bradycardia     Past Surgical History:  Procedure Laterality Date  . BREAST SURGERY    . HERNIA REPAIR    . REPLACEMENT TOTAL KNEE    . WRIST SURGERY       OB History   No obstetric history on file.     Family History  Problem Relation Age of Onset  . Leukemia Mother   . Stroke Father   . CAD  Father   . Cancer Father     Social History   Tobacco Use  . Smoking status: Never Smoker  . Smokeless tobacco: Never Used  Vaping Use  . Vaping Use: Never used  Substance Use Topics  . Alcohol use: Never  . Drug use: Never    Home Medications Prior to Admission medications   Medication Sig Start Date End Date Taking? Authorizing Provider  acetaminophen (TYLENOL) 500 MG tablet Take 1,000 mg by mouth every 6 (six) hours as needed.    [provider]  hydrochlorothiazide (HYDRODIURIL) 25 MG tablet Take 1 tablet (25 mg total) by mouth daily as needed (for blood pressure above 140). 02/10/20   Lorretta Harp, MD  lisinopril (ZESTRIL) 20 MG tablet Take 20 mg by mouth at bedtime as needed.    [provider]  psyllium (METAMUCIL) 58.6 % packet Take 1 packet by mouth daily.    [provider]    Allergies    Nickel, Tape, and Zoloft [sertraline]  Review of Systems   Review of Systems  Constitutional: Positive for diaphoresis. Negative for fever.  HENT: Negative for sore throat.   Eyes: Negative for visual disturbance.  Respiratory: Negative for cough and shortness of breath.   Cardiovascular: Negative for chest pain and palpitations.  Gastrointestinal: Positive for nausea. Negative for  abdominal pain, constipation, diarrhea (loose stool) and vomiting.  Genitourinary: Negative for difficulty urinating.  Musculoskeletal: Negative for back pain and neck pain.  Skin: Negative for rash.  Neurological: Positive for dizziness, light-headedness and headaches. Negative for syncope, facial asymmetry, weakness and numbness.    Physical Exam Updated Vital Signs BP (!) 158/80   Pulse 80   Temp 98.6 F (37 C) (Oral)   Resp 19   Ht 5\' 5"  (1.651 m)   Wt 88.5 kg   SpO2 97%   BMI 32.45 kg/m   Physical Exam Vitals and nursing note reviewed.  Constitutional:      General: She is not in acute distress.    Appearance: She is well-developed. She is not  diaphoretic.  HENT:     Head: Normocephalic and atraumatic.  Eyes:     General: No visual field deficit.    Conjunctiva/sclera: Conjunctivae normal.  Cardiovascular:     Rate and Rhythm: Normal rate and regular rhythm.     Heart sounds: Normal heart sounds. No murmur heard.  No friction rub. No gallop.   Pulmonary:     Effort: Pulmonary effort is normal. No respiratory distress.     Breath sounds: Normal breath sounds. No wheezing or rales.  Abdominal:     General: There is no distension.     Palpations: Abdomen is soft.     Tenderness: There is no abdominal tenderness. There is no guarding.  Musculoskeletal:        General: No tenderness.     Cervical back: Normal range of motion.  Skin:    General: Skin is warm and dry.     Findings: No erythema or rash.  Neurological:     Mental Status: She is alert and oriented to person, place, and time.     GCS: GCS eye subscore is 4. GCS verbal subscore is 5. GCS motor subscore is 6.     Cranial Nerves: Cranial nerves are intact. No cranial nerve deficit or dysarthria. Facial asymmetry: slight asymmetry of face but no clear weakness.     Sensory: Sensation is intact. No sensory deficit.     Motor: Motor function is intact.     Coordination: Coordination is intact. Coordination normal. Heel to Shin Test normal.     Gait: Gait is intact. Abnormal gait: walks slowly as she feels unsteady, no sign of ataxia.     ED Results / Procedures / Treatments   Labs (all labs ordered are listed, but only abnormal results are displayed) Labs Reviewed  BASIC METABOLIC PANEL - Abnormal; Notable for the following components:      Result Value   CO2 21 (*)    All other components within normal limits  CBC - Abnormal; Notable for the following components:   RBC 5.47 (*)    Hemoglobin 15.7 (*)    HCT 48.3 (*)    All other components within normal limits  URINALYSIS, ROUTINE W REFLEX MICROSCOPIC - Abnormal; Notable for the following components:    Color, Urine STRAW (*)    Specific Gravity, Urine <1.005 (*)    All other components within normal limits  CBG MONITORING, ED - Abnormal; Notable for the following components:   Glucose-Capillary 111 (*)    All other components within normal limits    EKG EKG Interpretation  Date/Time:  Saturday September 04 2020 09:40:37 EDT Ventricular Rate:  65 PR Interval:    QRS Duration: 88 QT Interval:  383 QTC Calculation: 399 R Axis:  1 Text Interpretation: Sinus or ectopic atrial rhythm Short PR interval Low voltage, precordial leads No significant change since last tracing Confirmed by Gareth Morgan [id number]) on 09/04/2020 10:12:02 AM   Radiology CT Angio Head W or Wo Contrast  Result Date: 09/04/2020 CLINICAL DATA:  Dizziness EXAM: CT ANGIOGRAPHY HEAD AND NECK TECHNIQUE: Multidetector CT imaging of the head and neck was performed using the standard protocol during bolus administration of intravenous contrast. Multiplanar CT image reconstructions and MIPs were obtained to evaluate the vascular anatomy. Carotid stenosis measurements (when applicable) are obtained utilizing NASCET criteria, using the distal internal carotid diameter as the denominator. CONTRAST:  152mL OMNIPAQUE IOHEXOL 350 MG/ML SOLN COMPARISON:  CT head 08/23/2019 FINDINGS: CT HEAD Brain: There is no acute intracranial hemorrhage, mass effect, or edema. Gray-white differentiation is preserved. There is no extra-axial fluid collection. Ventricles and sulci are within normal limits in size and configuration. Vascular: No hyperdense vessel or unexpected calcification. Skull: Calvarium is unremarkable. Sinuses/Orbits: No acute finding. Other: None. Review of the MIP images confirms the above findings CTA NECK Aortic arch: Great vessel origins are patent. Right carotid system: Patent. Mild calcified plaque at the ICA origin without measurable stenosis. Left carotid system: Patent. Mixed plaque at the ICA origin causing less than 50%  stenosis. Vertebral arteries: Patent and codominant. No measurable stenosis or evidence of dissection. Skeleton: Mild degenerative changes of the cervical spine. Other neck: No mass or adenopathy. Upper chest: No apical lung mass. Review of the MIP images confirms the above findings CTA HEAD Anterior circulation: Intracranial internal carotid arteries are patent. Anterior and middle cerebral arteries are patent. Posterior circulation: Intracranial vertebral arteries, basilar artery, and posterior cerebral arteries are patent. Venous sinuses: Patent as allowed by contrast bolus timing. Review of the MIP images confirms the above findings IMPRESSION: No acute intracranial abnormality. No large vessel occlusion, hemodynamically significant stenosis, or evidence of dissection. Electronically Signed   By: Macy Mis M.D.   On: 09/04/2020 12:52   CT Angio Neck W and/or Wo Contrast  Result Date: 09/04/2020 CLINICAL DATA:  Dizziness EXAM: CT ANGIOGRAPHY HEAD AND NECK TECHNIQUE: Multidetector CT imaging of the head and neck was performed using the standard protocol during bolus administration of intravenous contrast. Multiplanar CT image reconstructions and MIPs were obtained to evaluate the vascular anatomy. Carotid stenosis measurements (when applicable) are obtained utilizing NASCET criteria, using the distal internal carotid diameter as the denominator. CONTRAST:  193mL OMNIPAQUE IOHEXOL 350 MG/ML SOLN COMPARISON:  CT head 08/23/2019 FINDINGS: CT HEAD Brain: There is no acute intracranial hemorrhage, mass effect, or edema. Gray-white differentiation is preserved. There is no extra-axial fluid collection. Ventricles and sulci are within normal limits in size and configuration. Vascular: No hyperdense vessel or unexpected calcification. Skull: Calvarium is unremarkable. Sinuses/Orbits: No acute finding. Other: None. Review of the MIP images confirms the above findings CTA NECK Aortic arch: Great vessel origins are  patent. Right carotid system: Patent. Mild calcified plaque at the ICA origin without measurable stenosis. Left carotid system: Patent. Mixed plaque at the ICA origin causing less than 50% stenosis. Vertebral arteries: Patent and codominant. No measurable stenosis or evidence of dissection. Skeleton: Mild degenerative changes of the cervical spine. Other neck: No mass or adenopathy. Upper chest: No apical lung mass. Review of the MIP images confirms the above findings CTA HEAD Anterior circulation: Intracranial internal carotid arteries are patent. Anterior and middle cerebral arteries are patent. Posterior circulation: Intracranial vertebral arteries, basilar artery, and posterior cerebral arteries are patent. Venous  sinuses: Patent as allowed by contrast bolus timing. Review of the MIP images confirms the above findings IMPRESSION: No acute intracranial abnormality. No large vessel occlusion, hemodynamically significant stenosis, or evidence of dissection. Electronically Signed   By: Macy Mis M.D.   On: 09/04/2020 12:52    Procedures Procedures (including critical care time)  Medications Ordered in ED Medications  iohexol (OMNIPAQUE) 350 MG/ML injection 100 mL (100 mLs Intravenous Contrast Given 09/04/20 1209)  meclizine (ANTIVERT) tablet 25 mg (25 mg Oral Given 09/04/20 1501)    ED Course  I have reviewed the triage vital signs and the nursing notes.  Pertinent labs & imaging results that were available during my care of the patient were reviewed by me and considered in my medical decision making (see chart for details).    MDM Rules/Calculators/A&P                          64yo female with history of prior breast cancer, migraines, paroxysmal supraventricular tachycardia, hypertension, recent diagnosis of vertigo, presents with concern for dizziness.  Describes both lightheadedness and sensation of being off balance. No chest pain, doubt ACS/PE.  CTA completed given  dizziness,headache is without acute findings, no signs of acute vessel occlusion or dissection.    Labs show no significant abnormalities.  No sign of urinary tract infection.  MRI brain ordered to evaluate for CVA or other abnormality and is pending at time of transfer of care. If negative, feel she is appropriate for continued outpatient follow up.   Final Clinical Impression(s) / ED Diagnoses Final diagnoses:  Dizziness    Rx / DC Orders ED Discharge Orders    None       Gareth Morgan, MD 09/04/20 1511

## 2020-12-17 ENCOUNTER — Emergency Department (HOSPITAL_BASED_OUTPATIENT_CLINIC_OR_DEPARTMENT_OTHER): Payer: Medicare Other

## 2020-12-17 ENCOUNTER — Emergency Department (HOSPITAL_BASED_OUTPATIENT_CLINIC_OR_DEPARTMENT_OTHER)
Admission: EM | Admit: 2020-12-17 | Discharge: 2020-12-18 | Disposition: A | Discharge status: Home / Self Care | Payer: Medicare Other | Attending: Emergency Medicine | Admitting: Emergency Medicine

## 2020-12-17 ENCOUNTER — Encounter (HOSPITAL_BASED_OUTPATIENT_CLINIC_OR_DEPARTMENT_OTHER): Payer: Self-pay | Admitting: *Deleted

## 2020-12-17 ENCOUNTER — Other Ambulatory Visit: Payer: Self-pay

## 2020-12-17 DIAGNOSIS — R197 Diarrhea, unspecified: Secondary | ICD-10-CM | POA: Insufficient documentation

## 2020-12-17 DIAGNOSIS — N10 Acute tubulo-interstitial nephritis: Secondary | ICD-10-CM | POA: Diagnosis not present

## 2020-12-17 DIAGNOSIS — I10 Essential (primary) hypertension: Secondary | ICD-10-CM | POA: Insufficient documentation

## 2020-12-17 DIAGNOSIS — R109 Unspecified abdominal pain: Secondary | ICD-10-CM | POA: Diagnosis present

## 2020-12-17 DIAGNOSIS — Z96659 Presence of unspecified artificial knee joint: Secondary | ICD-10-CM | POA: Diagnosis not present

## 2020-12-17 DIAGNOSIS — Z853 Personal history of malignant neoplasm of breast: Secondary | ICD-10-CM | POA: Insufficient documentation

## 2020-12-17 LAB — URINALYSIS, ROUTINE W REFLEX MICROSCOPIC
Bilirubin Urine: NEGATIVE
Glucose, UA: NEGATIVE mg/dL
Ketones, ur: NEGATIVE mg/dL
Nitrite: NEGATIVE
Protein, ur: 30 mg/dL — AB
Specific Gravity, Urine: 1.01 (ref 1.005–1.030)
pH: 6.5 (ref 5.0–8.0)

## 2020-12-17 LAB — URINALYSIS, MICROSCOPIC (REFLEX)

## 2020-12-17 LAB — CBG MONITORING, ED: Glucose-Capillary: 106 mg/dL — ABNORMAL HIGH (ref 70–99)

## 2020-12-17 NOTE — ED Provider Notes (Signed)
[facility] Provider Note   CSN: [id number] Arrival date & time: 12/17/20  2241     History Chief Complaint  Patient presents with  . Urinary Frequency    Kirsten Choi is a 65 y.o. female.  Patient here with urgency, frequency and painful urination since approximately 3 or 4 PM.  States she developed stinging and burning at the end of urination and has gone frequently since and feels like she cannot empty her bladder.  She is going several times an hour and it is painful and burning after she urinates.  She does not seen any blood.  She has not had a fever.  She is had some left-sided flank pain that radiates across her left abdomen and groin.  She is not had this kind of pain before.  She had some diarrhea but believes this is due to using Metamucil earlier today.  There is been no vomiting or fever.  No chest pain or shortness of breath.  No radiation of pain to her back and leg.  She reports breast cancer in remission status post surgery.  She has never had a kidney stone before.  She reports no vaginal bleeding or discharge.  She feels a dysuria and discomfort in her vagina and burning after she urinates but no pain.  She did not try to take anything for it at home.   The history is provided by the patient.  Urinary Frequency Associated symptoms include abdominal pain. Pertinent negatives include no headaches and no shortness of breath.       Past Medical History:  Diagnosis Date  . Arthritis   . Cancer ([place])    breast CA (resolved)  . Hypertension   . Migraine     Patient Active Problem List   Diagnosis Date Noted  . Paroxysmal SVT (supraventricular tachycardia) ([place]) 01/20/2020  . Hypertension   . Bradycardia     Past Surgical History:  Procedure Laterality Date  . BREAST SURGERY    . COSMETIC SURGERY    . HERNIA REPAIR    . REPLACEMENT TOTAL KNEE    . WRIST SURGERY       OB History   No obstetric history on file.     Family  History  Problem Relation Age of Onset  . Leukemia Mother   . Stroke Father   . CAD Father   . Cancer Father     Social History   Tobacco Use  . Smoking status: Never Smoker  . Smokeless tobacco: Never Used  Vaping Use  . Vaping Use: Never used  Substance Use Topics  . Alcohol use: Never  . Drug use: Never    Home Medications Prior to Admission medications   Medication Sig Start Date End Date Taking? Authorizing Provider  acetaminophen (TYLENOL) 500 MG tablet Take 1,000 mg by mouth every 6 (six) hours as needed.    [provider]  hydrochlorothiazide (HYDRODIURIL) 25 MG tablet Take 1 tablet (25 mg total) by mouth daily as needed (for blood pressure above 140). 02/10/20   Lorretta Harp, MD  lisinopril (ZESTRIL) 20 MG tablet Take 20 mg by mouth at bedtime as needed.    [provider]  meclizine (ANTIVERT) 25 MG tablet Take 1 tablet (25 mg total) by mouth 2 (two) times daily as needed for up to 20 doses for dizziness. 09/04/20   Curatolo, Adam, DO  psyllium (METAMUCIL) 58.6 % packet Take 1 packet by mouth daily.    [provider]    Allergies    Nickel, Tape, and Zoloft [sertraline]  Review of Systems   Review of Systems  Constitutional: Negative for activity change, appetite change, fatigue and fever.  HENT: Negative for congestion and rhinorrhea.   Eyes: Negative for visual disturbance.  Respiratory: Negative for cough, chest tightness and shortness of breath.   Gastrointestinal: Positive for abdominal pain and diarrhea. Negative for nausea and vomiting.  Genitourinary: Positive for dysuria, flank pain, frequency and urgency. Negative for hematuria, vaginal bleeding and vaginal discharge.  Musculoskeletal: Positive for back pain.  Skin: Negative for rash.  Neurological: Negative for dizziness, weakness and headaches.   all other systems are negative except as noted in the HPI and PMH.   Physical Exam Updated Vital Signs BP (!) 200/100    Pulse 96   Temp 98.9 F (37.2 C) (Oral)   Resp 18   Ht 5\' 5"  (1.651 m)   Wt 90.7 kg   SpO2 98%   BMI 33.28 kg/m   Physical Exam Vitals and nursing note reviewed.  Constitutional:      General: She is not in acute distress.    Appearance: Normal appearance. She is well-developed, normal weight and well-nourished. She is not ill-appearing.  HENT:     Head: Normocephalic and atraumatic.     Mouth/Throat:     Mouth: Oropharynx is clear and moist.     Pharynx: No oropharyngeal exudate.  Eyes:     Extraocular Movements: EOM normal.     Conjunctiva/sclera: Conjunctivae normal.     Pupils: Pupils are equal, round, and reactive to light.  Neck:     Comments: No meningismus. Cardiovascular:     Rate and Rhythm: Normal rate and regular rhythm.     Pulses: Intact distal pulses.     Heart sounds: Normal heart sounds. No murmur heard.   Pulmonary:     Effort: Pulmonary effort is normal. No respiratory distress.     Breath sounds: Normal breath sounds.  Abdominal:     Palpations: Abdomen is soft.     Tenderness: There is abdominal tenderness. There is no guarding or rebound.     Comments: Periumbilical left lower quadrant tenderness, no guarding or rebound  Musculoskeletal:        General: Tenderness present. No edema. Normal range of motion.     Cervical back: Normal range of motion and neck supple.     Comments: Left paraspinal lumbar tenderness, no midline tenderness  5/5 strength in bilateral lower extremities. Ankle plantar and dorsiflexion intact. Great toe extension intact bilaterally. +2 DP and PT pulses. +2 patellar reflexes bilaterally. Normal gait.   Skin:    General: Skin is warm.  Neurological:     General: No focal deficit present.     Mental Status: She is alert and oriented to person, place, and time. Mental status is at baseline.     Cranial Nerves: No cranial nerve deficit.     Motor: No abnormal muscle tone.     Coordination: Coordination normal.      Comments:  5/5 strength throughout. CN 2-12 intact.Equal grip strength.   Psychiatric:        Mood and Affect: Mood and affect normal.        Behavior: Behavior normal.     ED Results / Procedures / Treatments   Labs (all labs ordered are listed, but only abnormal results are displayed) Labs Reviewed  URINALYSIS, ROUTINE W REFLEX MICROSCOPIC - Abnormal; Notable for the following  components:      Result Value   Color, Urine AMBER (*)    APPearance HAZY (*)    Hgb urine dipstick LARGE (*)    Protein, ur 30 (*)    Leukocytes,Ua MODERATE (*)    All other components within normal limits  URINALYSIS, MICROSCOPIC (REFLEX) - Abnormal; Notable for the following components:   Bacteria, UA RARE (*)    All other components within normal limits  CBG MONITORING, ED - Abnormal; Notable for the following components:   Glucose-Capillary 106 (*)    All other components within normal limits  CBC WITH DIFFERENTIAL/PLATELET  BASIC METABOLIC PANEL    EKG None  Radiology CT Renal Stone Study  Result Date: 12/18/2020 CLINICAL DATA:  Hematuria.  Painful urination. EXAM: CT ABDOMEN AND PELVIS WITHOUT CONTRAST TECHNIQUE: Multidetector CT imaging of the abdomen and pelvis was performed following the standard protocol without IV contrast. COMPARISON:  None. FINDINGS: Lower chest: Compressive atelectasis related to lower thoracic osteophytes. No pneumonia or pleural effusion. Hepatobiliary: Enlarged liver spans 22 cm cranial caudal. No focal hepatic abnormality. Clips in the gallbladder fossa postcholecystectomy. No biliary dilatation. Pancreas: No ductal dilatation or inflammation. Spleen: Normal in size without focal abnormality. Adrenals/Urinary Tract: No adrenal nodule. Slight prominence of the left renal pelvis and ureter. No visualized stones along the course of the ureter. No renal calculi. Right kidney is unremarkable. Right ureter is decompressed. Urinary bladder is partially distended, mild wall  thickening. No bladder stone. Stomach/Bowel: Partially distended stomach. Incidental duodenal diverticulum without inflammation. No bowel obstruction. There is fecalization of distal small bowel contents. Appendix is prominent but air-filled without appendicitis. Moderate volume of colonic stool. No colonic wall thickening. Minor sigmoid colonic diverticulosis without diverticulitis. Vascular/Lymphatic: Aortic atherosclerosis and tortuosity suggesting hypertension. No aortic aneurysm. No abdominopelvic adenopathy. Reproductive: Quiescent uterus. Quiescent ovaries tentatively visualized. No adnexal mass. Other: Postsurgical change of the anterior abdominal wall with fat containing umbilical and periumbilical hernias. No inflammation. No free air or ascites. Musculoskeletal: Grade 1 anterolisthesis of L4 on L5 likely facet mediated. There are no acute or suspicious osseous abnormalities. IMPRESSION: 1. Slight prominence of the left renal pelvis and ureter. No visualized urolithiasis. Given there is mild bladder wall thickening, findings favor cystitis and ascending urinary tract infection. 2. Hepatomegaly. 3. Minor sigmoid colonic diverticulosis without diverticulitis. Aortic Atherosclerosis (ICD10-I70.0). Electronically Signed   By: Keith Rake M.D.   On: 12/18/2020 00:31    Procedures Procedures   Medications Ordered in ED Medications - No data to display  ED Course  I have reviewed the triage vital signs and the nursing notes.  Pertinent labs & imaging results that were available during my care of the patient were reviewed by me and considered in my medical decision making (see chart for details).    MDM Rules/Calculators/A&P                          Left-sided flank pain with dysuria, frequency and urgency.  Tenderness to periumbilical left lower quadrant on exam without guarding or rebound.  Urinalysis shows large blood and pyuria.  Will send culture.  Nitrite is negative, leukocyte  esterase is positive.  Bladder scan 42mL.  CT scan shows thickening of the ureter and bladder wall without stone or obstruction.  Leukocytosis noted.  Kidney function is normal.  Patient's hypertension is noted and states she has a history of this when she is in pain.  States compliance with her medications. We  will treat as suspected cystitis with possible pyelonephritis.  Urine culture is pending.  She is given a dose of IV Rocephin.  Patient informed of elevated blood pressure.  States she takes her medication in the morning.  She feels improved.  Pain is resolved and she has had no vomiting or diarrhea.  We will treat for possible cystitis with possible pyelonephritis.  Urine culture sent.  Return precautions discussed Final Clinical Impression(s) / ED Diagnoses Final diagnoses:  Acute pyelonephritis    Rx / DC Orders ED Discharge Orders    None       Fawnda Vitullo, Annie Main, MD 12/18/20 [phone]

## 2020-12-17 NOTE — ED Triage Notes (Signed)
C/o freq painful urination x 1 day 

## 2020-12-18 DIAGNOSIS — N10 Acute tubulo-interstitial nephritis: Secondary | ICD-10-CM | POA: Diagnosis not present

## 2020-12-18 LAB — CBC WITH DIFFERENTIAL/PLATELET
Abs Immature Granulocytes: 0.06 10*3/uL (ref 0.00–0.07)
Basophils Absolute: 0.1 10*3/uL (ref 0.0–0.1)
Basophils Relative: 1 %
Eosinophils Absolute: 0.1 10*3/uL (ref 0.0–0.5)
Eosinophils Relative: 1 %
HCT: 45.8 % (ref 36.0–46.0)
Hemoglobin: 15.1 g/dL — ABNORMAL HIGH (ref 12.0–15.0)
Immature Granulocytes: 1 %
Lymphocytes Relative: 12 %
Lymphs Abs: 1.6 10*3/uL (ref 0.7–4.0)
MCH: 28.9 pg (ref 26.0–34.0)
MCHC: 33 g/dL (ref 30.0–36.0)
MCV: 87.6 fL (ref 80.0–100.0)
Monocytes Absolute: 1.1 10*3/uL — ABNORMAL HIGH (ref 0.1–1.0)
Monocytes Relative: 8 %
Neutro Abs: 10.4 10*3/uL — ABNORMAL HIGH (ref 1.7–7.7)
Neutrophils Relative %: 77 %
Platelets: 305 10*3/uL (ref 150–400)
RBC: 5.23 MIL/uL — ABNORMAL HIGH (ref 3.87–5.11)
RDW: 12.2 % (ref 11.5–15.5)
WBC: 13.2 10*3/uL — ABNORMAL HIGH (ref 4.0–10.5)
nRBC: 0 % (ref 0.0–0.2)

## 2020-12-18 LAB — BASIC METABOLIC PANEL
Anion gap: 12 (ref 5–15)
BUN: 19 mg/dL (ref 8–23)
CO2: 23 mmol/L (ref 22–32)
Calcium: 9.1 mg/dL (ref 8.9–10.3)
Chloride: 104 mmol/L (ref 98–111)
Creatinine, Ser: 0.67 mg/dL (ref 0.44–1.00)
GFR, Estimated: >60 mL/min (ref >60)
Glucose, Bld: 111 mg/dL — ABNORMAL HIGH (ref 70–99)
Potassium: 4 mmol/L (ref 3.5–5.1)
Sodium: 139 mmol/L (ref 135–145)

## 2020-12-18 MED ORDER — SODIUM CHLORIDE 0.9 % IV SOLN
1.0000 g | Freq: Once | INTRAVENOUS | Status: AC
  Administered 2020-12-18: 1 g via INTRAVENOUS
  Filled 2020-12-18: qty 10

## 2020-12-18 MED ORDER — CEPHALEXIN 500 MG PO CAPS: 500.0000 mg | ORAL_CAPSULE | Freq: Three times a day (TID) | ORAL | 0 refills | Status: DC

## 2020-12-18 MED ORDER — SODIUM CHLORIDE 0.9 % IV BOLUS
1000.0000 mL | Freq: Once | INTRAVENOUS | Status: AC
  Administered 2020-12-18: 1000 mL via INTRAVENOUS

## 2020-12-18 NOTE — Discharge Instructions (Signed)
Take the antibiotics as prescribed and follow-up with your doctor.  Return to the ED with worsening pain, fever, vomiting, any other concerns.

## 2020-12-20 LAB — URINE CULTURE: Culture: >=100000 — AB

## 2021-03-20 IMAGING — CT CT HEAD W/O CM
3 series · 16 of 47 positions shown, 19 images · non-contrast
Comparison: Head CT dated 02/14/2019

CLINICAL DATA: 63-year-old female with headache.

EXAM:
CT HEAD WITHOUT CONTRAST
TECHNIQUE: Contiguous axial images were obtained from the base of the skull
through the vertex without intravenous contrast.

[Series 2: head wo · axial · 0.49mm/px · z∈[-179,-39]mm · 10 of 34 slices shown, 13 images]
[im 3/34  brain]
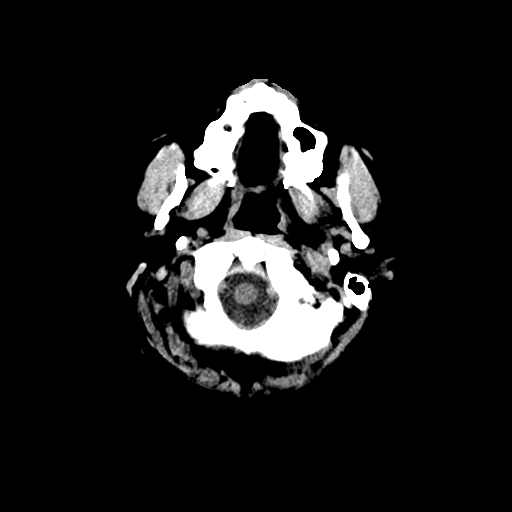
[im 3/34  bone]
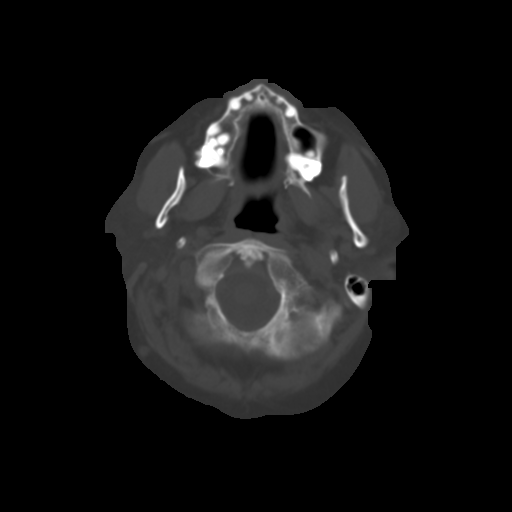
[im 6/34  brain]
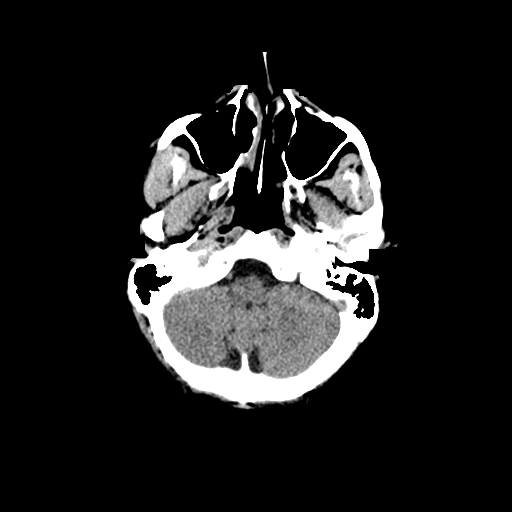
[im 10/34  brain]
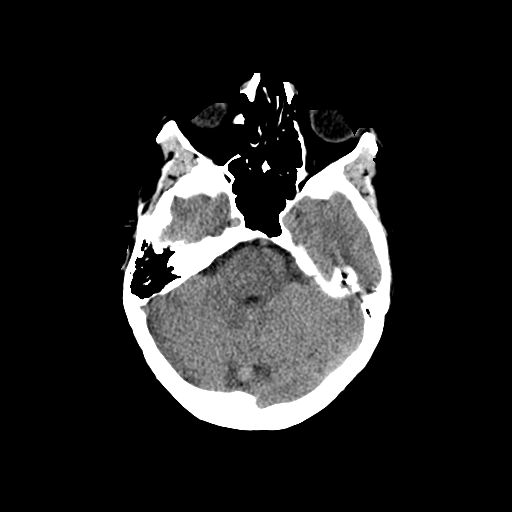
[im 12/34  brain]
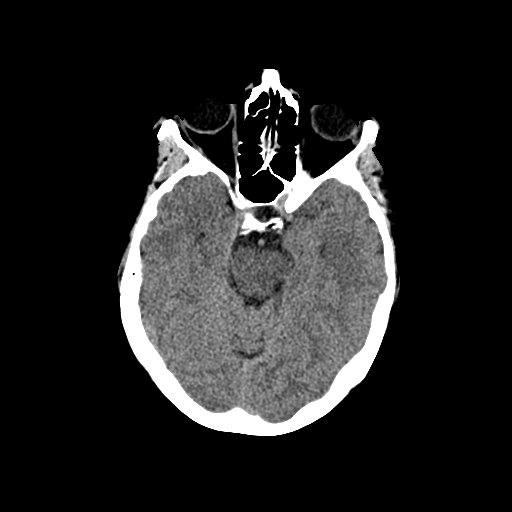
[im 15/34  brain]
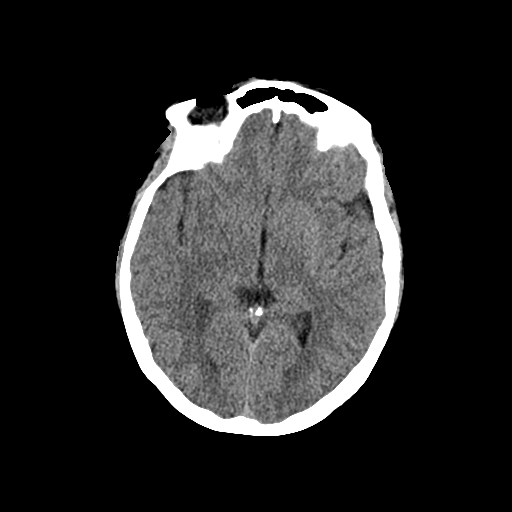
[im 15/34  bone]
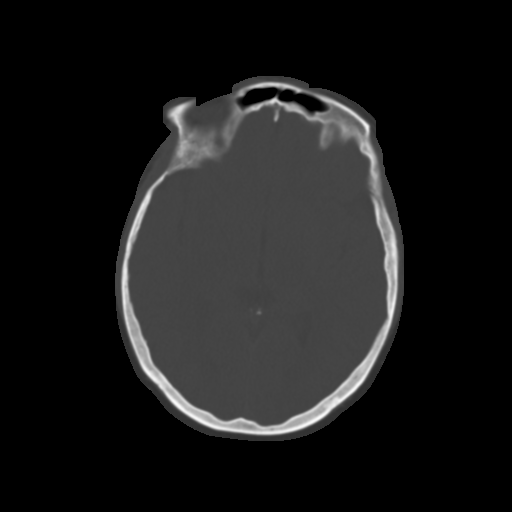
[im 19/34  brain]
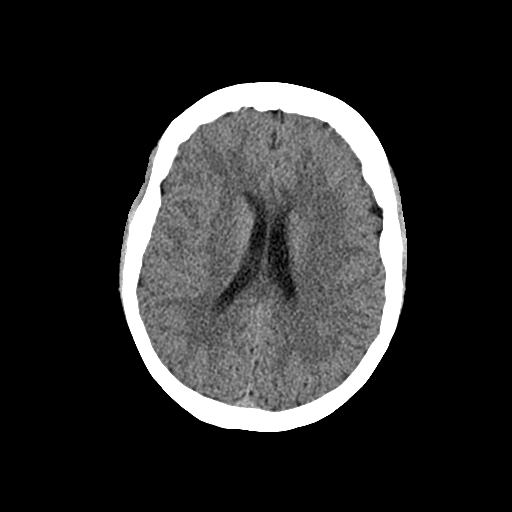
[im 22/34  brain]
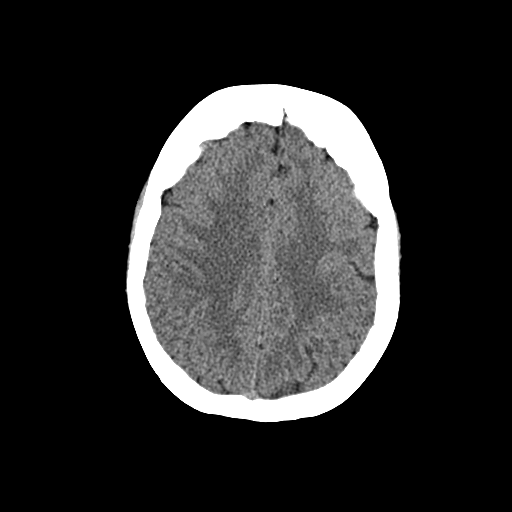
[im 26/34  brain]
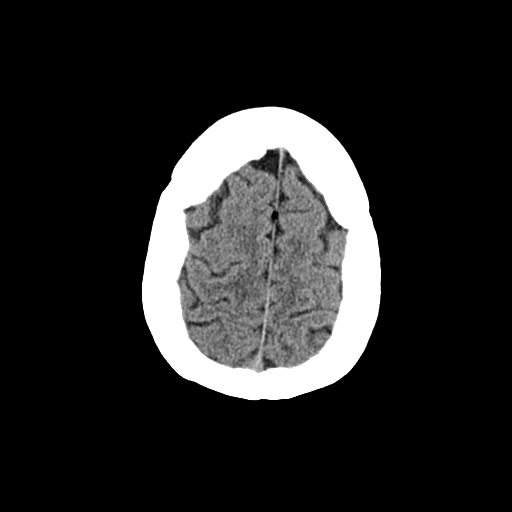
[im 28/34  brain]
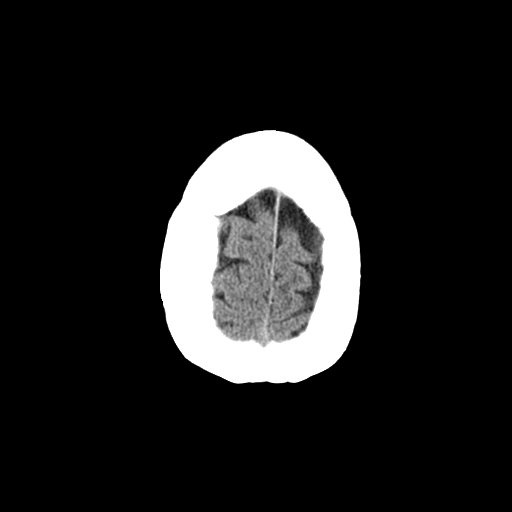
[im 28/34  bone]
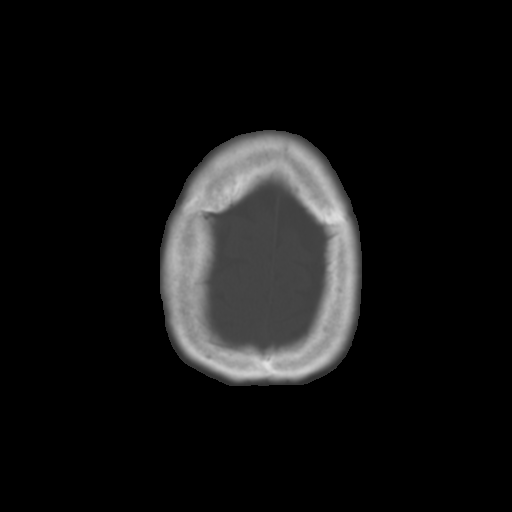
[im 31/34  brain]
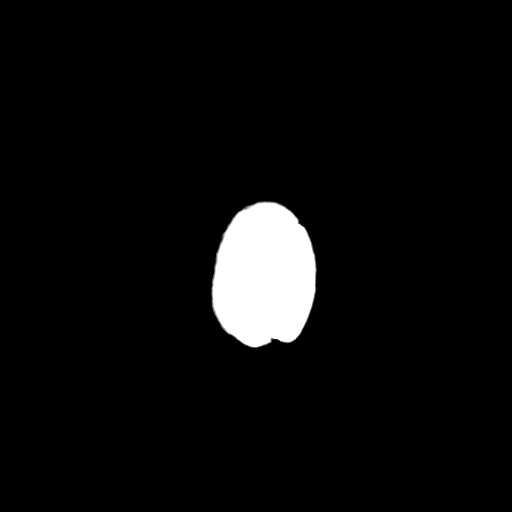

[Series 4: cor soft · coronal · 0.40mm/px · 3 of 84 slices shown]
[im 28/84  brain]
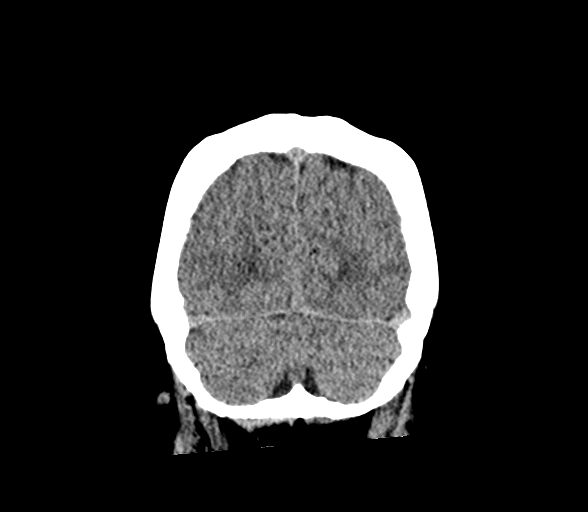
[im 37/84  brain]
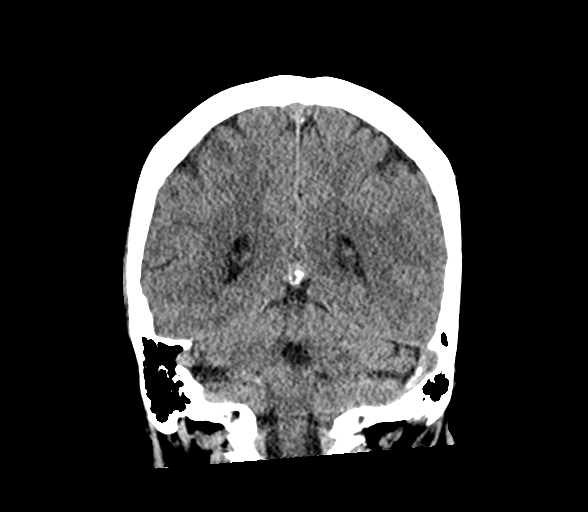
[im 47/84  brain]
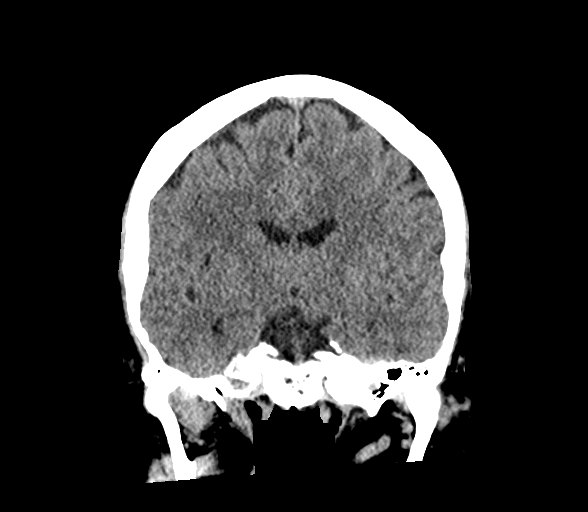

[Series 5: sag soft · sagittal · 0.40mm/px · 3 of 78 slices shown]
[im 26/78  brain]
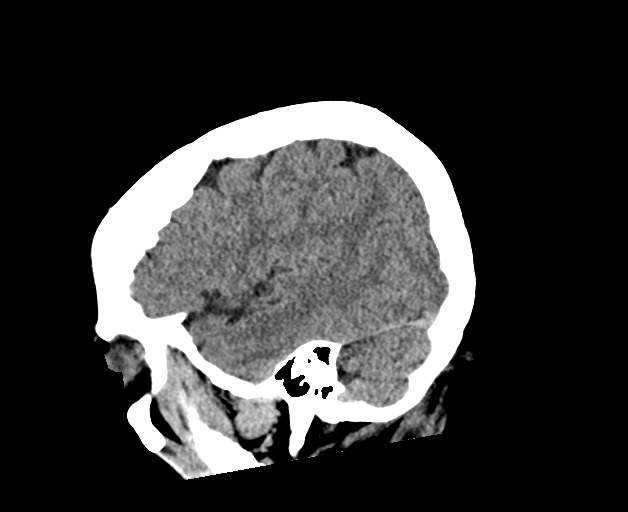
[im 39/78  brain]
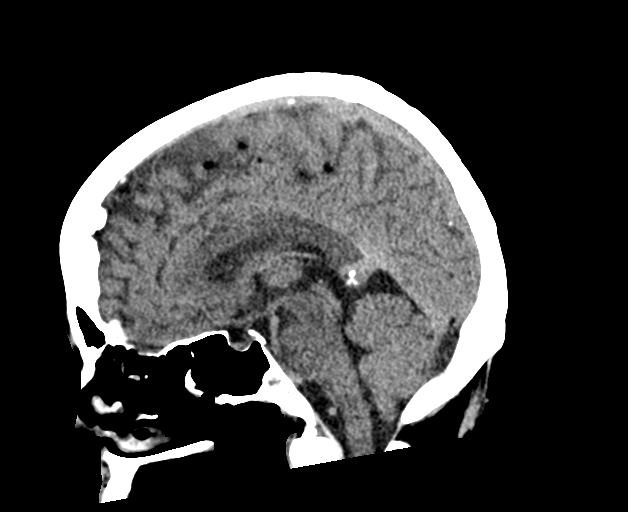
[im 52/78  brain]
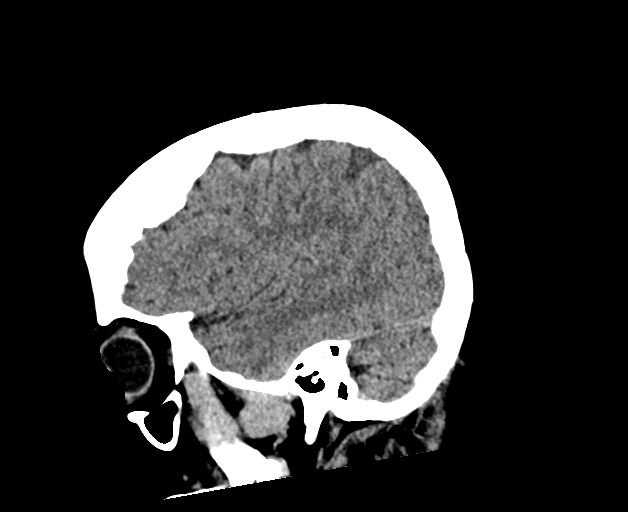

[16 of 47 positions shown; findings below may reference images not displayed]

FINDINGS: Brain: The ventricles and sulci appropriate size for patient's age.
The gray-white matter discrimination is preserved. There is no acute
intracranial hemorrhage. No mass effect or midline shift. No
extra-axial fluid collection.

Vascular: No hyperdense vessel or unexpected calcification.

Skull: Normal. Negative for fracture or focal lesion.

Sinuses/Orbits: No acute finding.

Other: None
IMPRESSION: Unremarkable noncontrast CT of the brain.

## 2021-03-21 ENCOUNTER — Emergency Department (HOSPITAL_BASED_OUTPATIENT_CLINIC_OR_DEPARTMENT_OTHER)
Admission: EM | Admit: 2021-03-21 | Discharge: 2021-03-21 | Disposition: A | Discharge status: Home / Self Care | Payer: Medicare Other | Attending: Emergency Medicine | Admitting: Emergency Medicine

## 2021-03-21 ENCOUNTER — Emergency Department (HOSPITAL_BASED_OUTPATIENT_CLINIC_OR_DEPARTMENT_OTHER): Payer: Medicare Other

## 2021-03-21 ENCOUNTER — Other Ambulatory Visit: Payer: Self-pay

## 2021-03-21 ENCOUNTER — Encounter (HOSPITAL_BASED_OUTPATIENT_CLINIC_OR_DEPARTMENT_OTHER): Payer: Self-pay

## 2021-03-21 DIAGNOSIS — Z79899 Other long term (current) drug therapy: Secondary | ICD-10-CM | POA: Diagnosis not present

## 2021-03-21 DIAGNOSIS — K5900 Constipation, unspecified: Secondary | ICD-10-CM | POA: Diagnosis not present

## 2021-03-21 DIAGNOSIS — Z96659 Presence of unspecified artificial knee joint: Secondary | ICD-10-CM | POA: Diagnosis not present

## 2021-03-21 DIAGNOSIS — I10 Essential (primary) hypertension: Secondary | ICD-10-CM | POA: Diagnosis not present

## 2021-03-21 DIAGNOSIS — R194 Change in bowel habit: Secondary | ICD-10-CM | POA: Diagnosis present

## 2021-03-21 DIAGNOSIS — Z853 Personal history of malignant neoplasm of breast: Secondary | ICD-10-CM | POA: Diagnosis not present

## 2021-03-21 DIAGNOSIS — Z8719 Personal history of other diseases of the digestive system: Secondary | ICD-10-CM | POA: Diagnosis not present

## 2021-03-21 MED ORDER — MAGNESIUM CITRATE PO SOLN
1.0000 | Freq: Once | ORAL | Status: AC
  Administered 2021-03-21: 1 via ORAL
  Filled 2021-03-21: qty 296

## 2021-03-21 NOTE — ED Provider Notes (Signed)
[facility] Provider Note: Georgena Spurling, MD, FACEP  CSN: [id number] MRN: [id number] ARRIVAL: 03/21/21 at [place]: [place]  Constipation   HISTORY OF PRESENT ILLNESS  03/21/21 3:27 AM Kirsten Choi is a 65 y.o. female with a history of chronic constipation.  She has not had a normal bowel movement since 03/15/2021.  She has been using laxatives and stool softeners but has had difficulty "pushing out" stool and is only passing small quantities.  She feels like her abdomen has become distended.  She denies nausea or vomiting.  She is having pain in her abdomen which she rates as a 7 out of 10.  This is located primarily in her upper abdomen and she describes it as a pressure.   Past Medical History:  Diagnosis Date  . Arthritis   . Cancer ([place])    breast CA (resolved)  . Hypertension   . Migraine     Past Surgical History:  Procedure Laterality Date  . BREAST SURGERY    . COSMETIC SURGERY    . HERNIA REPAIR    . REPLACEMENT TOTAL KNEE    . WRIST SURGERY      Family History  Problem Relation Age of Onset  . Leukemia Mother   . Stroke Father   . CAD Father   . Cancer Father     Social History   Tobacco Use  . Smoking status: Never Smoker  . Smokeless tobacco: Never Used  Vaping Use  . Vaping Use: Never used  Substance Use Topics  . Alcohol use: Never  . Drug use: Never    Prior to Admission medications   Medication Sig Start Date End Date Taking? Authorizing Provider  docusate sodium (COLACE) 100 MG capsule Take 200 mg by mouth 2 (two) times daily.   Yes [provider]  hydrochlorothiazide (HYDRODIURIL) 25 MG tablet Take 1 tablet (25 mg total) by mouth daily as needed (for blood pressure above 140). 02/10/20   Lorretta Harp, MD  lisinopril (ZESTRIL) 20 MG tablet Take 20 mg by mouth at bedtime as needed.    [provider]  meclizine (ANTIVERT) 25 MG tablet Take 1 tablet (25 mg total) by mouth 2 (two) times  daily as needed for up to 20 doses for dizziness. 09/04/20   Curatolo, Adam, DO  psyllium (METAMUCIL) 58.6 % packet Take 1 packet by mouth daily.    [provider]    Allergies Nickel, Tape, and Zoloft [sertraline]   REVIEW OF SYSTEMS  Negative except as noted here or in the History of Present Illness.   PHYSICAL EXAMINATION  Initial Vital Signs Blood pressure (!) 186/104, pulse 77, temperature 98.2 F (36.8 C), temperature source Oral, resp. rate 16, height 5\' 5"  (1.651 m), weight 91.2 kg, SpO2 98 %.  Examination General: Well-developed, well-nourished female in no acute distress; appearance consistent with age of record HENT: normocephalic; atraumatic Eyes: pupils equal, round and reactive to light; extraocular muscles intact Neck: supple Heart: regular rate and rhythm Lungs: clear to auscultation bilaterally Abdomen: soft; distended; mild diffuse tenderness; bowel sounds present Rectal: Normal sphincter tone; no stool in vault Extremities: No deformity; full range of motion; pulses normal Neurologic: Awake, alert and oriented; motor function intact in all extremities and symmetric; no facial droop Skin: Warm and dry Psychiatric: Normal mood and affect   RESULTS  Summary of this visit's results, reviewed and interpreted by myself:   EKG Interpretation  Date/Time:    Ventricular  Rate:    PR Interval:    QRS Duration:   QT Interval:    QTC Calculation:   R Axis:     Text Interpretation:        Laboratory Studies: No results found for this or any previous visit (from the past 24 hour(s)). Imaging Studies: CT ABDOMEN PELVIS WO CONTRAST  Result Date: 03/21/2021 CLINICAL DATA:  Bowel obstruction suspected EXAM: CT ABDOMEN AND PELVIS WITHOUT CONTRAST TECHNIQUE: Multidetector CT imaging of the abdomen and pelvis was performed following the standard protocol without IV contrast. COMPARISON:  None. FINDINGS: Lower chest: Scarring related to osteophytes in the  right lower lobe. No acute finding. Hepatobiliary: No focal liver abnormality.Cholecystectomy. Pancreas: Unremarkable. Spleen: Unremarkable. Adrenals/Urinary Tract: Negative adrenals. No hydronephrosis or stone. Unremarkable bladder. Stomach/Bowel: No obstruction. No appendicitis or other visible bowel inflammation. Diffuse colonic stool without impaction or over distention. Vascular/Lymphatic: No acute vascular abnormality. Atheromatous calcification of the aorta. No mass or adenopathy. Reproductive:No pathologic findings. Other: No ascites or pneumoperitoneum. Musculoskeletal: No acute abnormalities. Facet osteoarthritis with prominent spurring and L4-5 anterolisthesis IMPRESSION: No acute finding.  No bowel obstruction or visible inflammation. Electronically Signed   By: Monte Fantasia M.D.   On: 03/21/2021 05:33    ED COURSE and MDM  Nursing notes, initial and subsequent vitals signs, including pulse oximetry, reviewed and interpreted by myself.  Vitals:   03/21/21 0211 03/21/21 0212 03/21/21 0337 03/21/21 0345  BP: (!) 186/104  (!) 177/89 (!) 147/78  Pulse: 77  70 (!) 56  Resp: 16  19 18   Temp: 98.2 F (36.8 C)     TempSrc: Oral     SpO2: 98%  98% 97%  Weight:  91.2 kg    Height:  5\' 5"  (1.651 m)     Medications  magnesium citrate solution 1 Bottle (has no administration in time range)    5:39 AM Patient advised of reassuring CT findings.  She was advised to avoid stimulant laxatives on a regular basis and only use them occasionally as needed.  She may continue taking Metamucil daily.  We will provide a dose of magnesium citrate as she does not tolerate MiraLAX (BP issues).  PROCEDURES  Procedures   ED DIAGNOSES     ICD-10-CM   1. Decreased frequency of bowel movements  R19.4        Laron Boorman, Jenny Reichmann, MD 03/21/21 [phone]

## 2021-03-21 NOTE — ED Notes (Signed)
Witnessed EDP perform rectal exam; EDP cancelled verbal order for POC Occult, card unable to be returned to [facility]

## 2021-03-21 NOTE — ED Triage Notes (Signed)
Patient arrived via POV, ambulated to triage without difficulty. States she has chronic constipation, states since last Tuesday 03/15/21, she has been using laxatives and stool softeners but doesn't feel like she is emptying bowels completely. States her last BM was this morning but was not as much as usual. Denies vomiting.

## 2021-03-21 NOTE — ED Notes (Signed)
EDP at bedside  

## 2021-05-10 ENCOUNTER — Other Ambulatory Visit: Payer: Self-pay

## 2021-05-10 ENCOUNTER — Emergency Department (HOSPITAL_BASED_OUTPATIENT_CLINIC_OR_DEPARTMENT_OTHER): Payer: No Typology Code available for payment source

## 2021-05-10 ENCOUNTER — Emergency Department (HOSPITAL_BASED_OUTPATIENT_CLINIC_OR_DEPARTMENT_OTHER)
Admission: EM | Admit: 2021-05-10 | Discharge: 2021-05-10 | Disposition: A | Discharge status: Home / Self Care | Payer: No Typology Code available for payment source | Attending: Emergency Medicine | Admitting: Emergency Medicine

## 2021-05-10 DIAGNOSIS — R519 Headache, unspecified: Secondary | ICD-10-CM | POA: Insufficient documentation

## 2021-05-10 DIAGNOSIS — Y92410 Unspecified street and highway as the place of occurrence of the external cause: Secondary | ICD-10-CM | POA: Insufficient documentation

## 2021-05-10 DIAGNOSIS — Z853 Personal history of malignant neoplasm of breast: Secondary | ICD-10-CM | POA: Insufficient documentation

## 2021-05-10 DIAGNOSIS — M79609 Pain in unspecified limb: Secondary | ICD-10-CM | POA: Diagnosis not present

## 2021-05-10 DIAGNOSIS — M25512 Pain in left shoulder: Secondary | ICD-10-CM | POA: Insufficient documentation

## 2021-05-10 DIAGNOSIS — Z79899 Other long term (current) drug therapy: Secondary | ICD-10-CM | POA: Insufficient documentation

## 2021-05-10 DIAGNOSIS — M542 Cervicalgia: Secondary | ICD-10-CM | POA: Diagnosis not present

## 2021-05-10 DIAGNOSIS — Z96659 Presence of unspecified artificial knee joint: Secondary | ICD-10-CM | POA: Insufficient documentation

## 2021-05-10 DIAGNOSIS — I10 Essential (primary) hypertension: Secondary | ICD-10-CM | POA: Insufficient documentation

## 2021-05-10 MED ORDER — ACETAMINOPHEN 325 MG PO TABS
650.0000 mg | ORAL_TABLET | Freq: Once | ORAL | Status: AC
  Administered 2021-05-10: 650 mg via ORAL
  Filled 2021-05-10: qty 2

## 2021-05-10 NOTE — Discharge Instructions (Addendum)
Recommend Tylenol, ibuprofen, ice as needed for pain.  Have a low suspicion that you have concussion but if you continue to have some daily headaches follow-up with your primary care doctor to discuss this.  I given you some information to read about concussions.  Overall suspect that you have some soft tissue bruises.

## 2021-05-10 NOTE — ED Notes (Signed)
Patient transported to CT 

## 2021-05-10 NOTE — ED Notes (Signed)
Pt discharged to home. Discharge instructions have been discussed with patient and/or family members. Pt verbally acknowledges understanding d/c instructions, and endorses comprehension to checkout at registration before leaving.  °
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
## 2021-07-21 ENCOUNTER — Telehealth: Payer: Self-pay | Admitting: Pulmonary Disease

## 2021-07-21 DIAGNOSIS — G4733 Obstructive sleep apnea (adult) (pediatric): Secondary | ICD-10-CM

## 2021-07-22 NOTE — Telephone Encounter (Signed)
I called and spoke with patient regarding CPAP machine. Patient is scheduled to see Dr. Ander Slade on 08/23/21. Looks like from notes, Neurology at [facility], Dr. Doy Hutching, did a HST but I cannot find it in care everywhere at the moment and patient has been going back and forth with them and [employer] about sending and receiving the order and is wanting to know if Dr. Ander Slade will order one for her before the visit. It looks like Dr. Doy Hutching office sent in an order to [facility] on 07/20/21 and patient spoke with [employer] this morning and they still do not have it. I informed patient that I will send this to Dr. Ander Slade and see what he says. Patient will check with [employer] and Dr. Doy Hutching office today today to see if order goes though. Routing to Dr. Ander Slade.  Dr. Ander Slade, please advise. Thanks!

## 2021-07-25 NOTE — Telephone Encounter (Signed)
It appears that CPAP of 5-16 was ordered by Dr. Doy Hutching  Sleep study shows AHI of 15.1 by his documentation   Yes, I am okay with ordering CPAP pressure settings of 5-16  We still need to be able to track down the actual study as I think the DME company will require a copy of it

## 2021-07-25 NOTE — Telephone Encounter (Signed)
I have called the pt and she is aware of order that has been placed for her to get the cpap set up prior to her appt with AO.

## 2021-07-28 ENCOUNTER — Telehealth: Payer: Self-pay | Admitting: Pulmonary Disease

## 2021-07-28 NOTE — Telephone Encounter (Signed)
Copy of sleep study results reviewed performed at [facility]  Date of study was July 17, 2021  AHI of 15.1 Patient with a history of excessive daytime sleepiness, hypertension  CPAP recommended

## 2021-07-29 ENCOUNTER — Other Ambulatory Visit: Payer: Self-pay

## 2021-07-29 ENCOUNTER — Ambulatory Visit (INDEPENDENT_AMBULATORY_CARE_PROVIDER_SITE_OTHER): Payer: Medicare Other | Admitting: Pulmonary Disease

## 2021-07-29 ENCOUNTER — Encounter: Payer: Self-pay | Admitting: Pulmonary Disease

## 2021-07-29 VITALS — BP 130/70 | HR 62 | Temp 97.9°F | Ht 65.0 in | Wt 200.0 lb

## 2021-07-29 DIAGNOSIS — G4733 Obstructive sleep apnea (adult) (pediatric): Secondary | ICD-10-CM | POA: Diagnosis not present

## 2021-07-29 NOTE — Progress Notes (Signed)
Kirsten Choi    [id number]    1956-11-05  Primary Care Physician:Wilson, Myrtis Hopping, DO  Referring Physician: Francesca Oman, DO [address] [place],  [place] [address]  Chief complaint:   Patient being seen for obstructive sleep apnea  HPI:  Diagnosed with obstructive sleep apnea with an AHI of 15.1 Admits to snoring Dry mouth in the morning Occasional sore throat Nasal stuffiness and congestion  Usually goes to bed about 10 PM, wakes up at 5 AM 5 minutes to fall asleep 3-4 awakenings  Weight is up about 50 pounds  She does have a history of dysrhythmia as Difficult to control blood pressure   Outpatient Encounter Medications as of 07/29/2021  Medication Sig   Cholecalciferol 25 MCG (1000 UT) tablet Take by mouth.   ezetimibe (ZETIA) 10 MG tablet Take 1 tablet by mouth daily.   lisinopril (ZESTRIL) 20 MG tablet Take 20 mg by mouth at bedtime as needed.   lisinopril (ZESTRIL) 40 MG tablet Take 40 mg by mouth daily.   metoprolol succinate (TOPROL-XL) 25 MG 24 hr tablet Take 12.5 mg by mouth daily.   Multiple Vitamins-Minerals (MULTIPLE VITAMINS/WOMENS) tablet    psyllium (METAMUCIL) 58.6 % packet Take 1 packet by mouth daily.   docusate sodium (COLACE) 100 MG capsule Take 200 mg by mouth 2 (two) times daily. (Patient not taking: Reported on 07/29/2021)   hydrochlorothiazide (HYDRODIURIL) 25 MG tablet Take 1 tablet (25 mg total) by mouth daily as needed (for blood pressure above 140). (Patient not taking: Reported on 07/29/2021)   meclizine (ANTIVERT) 25 MG tablet Take 1 tablet (25 mg total) by mouth 2 (two) times daily as needed for up to 20 doses for dizziness.   [DISCONTINUED] traMADol (ULTRAM) 50 MG tablet Take 50 mg by mouth every 6 (six) hours as needed.   No facility-administered encounter medications on file as of 07/29/2021.    Allergies as of 07/29/2021 - Review Complete 07/29/2021  Allergen Reaction Noted   Nickel Dermatitis 10/19/2016    Statins  05/10/2021   Tape  02/14/2019   Zoloft [sertraline]  09/04/2020    Past Medical History:  Diagnosis Date   Arthritis    Cancer ([place])    breast CA (resolved)   Hypertension    Migraine     Past Surgical History:  Procedure Laterality Date   BREAST SURGERY     COSMETIC SURGERY     HERNIA REPAIR     REPLACEMENT TOTAL KNEE     WRIST SURGERY      Family History  Problem Relation Age of Onset   Leukemia Mother    Stroke Father    CAD Father    Cancer Father     Social History   Socioeconomic History   Marital status: Widowed    Spouse name: Not on file   Number of children: Not on file   Years of education: Not on file   Highest education level: Not on file  Occupational History   Not on file  Tobacco Use   Smoking status: Never   Smokeless tobacco: Never  Vaping Use   Vaping Use: Never used  Substance and Sexual Activity   Alcohol use: Never   Drug use: Never   Sexual activity: Not on file  Other Topics Concern   Not on file  Social History Narrative   Not on file   Social Determinants of Health   Financial Resource Strain:  Not on file  Food Insecurity: Not on file  Transportation Needs: Not on file  Physical Activity: Not on file  Stress: Not on file  Social Connections: Not on file  Intimate Partner Violence: Not on file    Review of Systems  Respiratory:  Positive for apnea.   Psychiatric/Behavioral:  Positive for sleep disturbance.    Vitals:   07/29/21 1621  BP: 130/70  Pulse: 62  Temp: 97.9 F (36.6 C)  SpO2: 99%     Physical Exam Constitutional:      Appearance: She is obese.  HENT:     Mouth/Throat:     Mouth: Mucous membranes are moist.     Comments: Mallampati 3 Eyes:     Pupils: Pupils are equal, round, and reactive to light.  Cardiovascular:     Rate and Rhythm: Normal rate and regular rhythm.     Heart sounds: No murmur heard.   No friction rub.  Pulmonary:     Effort: No respiratory distress.      Breath sounds: No stridor. No wheezing or rhonchi.  Musculoskeletal:     Cervical back: No rigidity or tenderness.  Neurological:     Mental Status: She is alert.  Psychiatric:        Mood and Affect: Mood normal.     Data Reviewed: Sleep study reviewed showing AHI of 15.1  Assessment:  Moderate obstructive sleep apnea  Cardiac dysrhythmia  Difficult to control hypertension  Pathophysiology of sleep disordered breathing discussed with the patient  Plan/Recommendations: Order will be placed for CPAP-auto CPAP  Nasal steroid for nasal stuffiness and congestion  Tentative follow-up in 6 to 8 weeks   Sherrilyn Rist MD [facility] 07/29/2021, 4:29 PM  CC: Francesca Oman, DO

## 2021-07-29 NOTE — Patient Instructions (Signed)
We will ensure that an order is in place for CPAP  For nasal stuffiness and congestion -Nasal steroid examples include Nasonex/Flonase-usually 2 sprays daily  Call with significant concerns  Will see you back in 6 to 8 weeks, preferably appointment will be after you have started CPAP for a few weeks

## 2021-08-23 ENCOUNTER — Institutional Professional Consult (permissible substitution): Payer: Medicare Other | Admitting: Pulmonary Disease

## 2021-09-19 ENCOUNTER — Ambulatory Visit: Payer: Medicare Other | Admitting: Pulmonary Disease

## 2021-09-28 ENCOUNTER — Ambulatory Visit (INDEPENDENT_AMBULATORY_CARE_PROVIDER_SITE_OTHER): Payer: Medicare Other | Admitting: Pulmonary Disease

## 2021-09-28 ENCOUNTER — Other Ambulatory Visit: Payer: Self-pay

## 2021-09-28 ENCOUNTER — Encounter: Payer: Self-pay | Admitting: Pulmonary Disease

## 2021-09-28 VITALS — BP 180/120 | HR 56 | Temp 98.0°F | Ht 64.0 in | Wt 207.0 lb

## 2021-09-28 DIAGNOSIS — G4733 Obstructive sleep apnea (adult) (pediatric): Secondary | ICD-10-CM

## 2021-09-28 DIAGNOSIS — Z9989 Dependence on other enabling machines and devices: Secondary | ICD-10-CM

## 2021-09-28 NOTE — Progress Notes (Signed)
Kirsten Choi    [id number]    1956/02/15  Primary Care Physician:Wilson, Myrtis Hopping, DO  Referring Physician: Francesca Oman, DO [address] [place],  [place] [address]  Chief complaint:   Patient being seen for obstructive sleep apnea  HPI:  Diagnosed with obstructive sleep apnea with an AHI of 15.1  Did have some issues with her machine, this has been fixed Tolerating CPAP well  Seems to be getting adequate sleep at night and waking up feeling rested  Usually goes to bed about 10 PM, wakes up at 5 AM 5 minutes to fall asleep 3-4 awakenings  Weight is up about 50 pounds  She does have a history of dysrhythmia as Difficult to control blood pressure   Outpatient Encounter Medications as of 09/28/2021  Medication Sig   aspirin EC 81 MG tablet Take 81 mg by mouth daily. Swallow whole.   Cholecalciferol 25 MCG (1000 UT) tablet Take by mouth.   ezetimibe (ZETIA) 10 MG tablet Take 1 tablet by mouth daily.   lisinopril (ZESTRIL) 40 MG tablet Take 40 mg by mouth daily.   Multiple Vitamins-Minerals (MULTIPLE VITAMINS/WOMENS) tablet    psyllium (METAMUCIL) 58.6 % packet Take 1 packet by mouth daily.   [DISCONTINUED] docusate sodium (COLACE) 100 MG capsule Take 200 mg by mouth 2 (two) times daily. (Patient not taking: Reported on 07/29/2021)   [DISCONTINUED] hydrochlorothiazide (HYDRODIURIL) 25 MG tablet Take 1 tablet (25 mg total) by mouth daily as needed (for blood pressure above 140). (Patient not taking: Reported on 07/29/2021)   [DISCONTINUED] lisinopril (ZESTRIL) 20 MG tablet Take 20 mg by mouth at bedtime as needed.   [DISCONTINUED] meclizine (ANTIVERT) 25 MG tablet Take 1 tablet (25 mg total) by mouth 2 (two) times daily as needed for up to 20 doses for dizziness.   [DISCONTINUED] metoprolol succinate (TOPROL-XL) 25 MG 24 hr tablet Take 12.5 mg by mouth daily.   No facility-administered encounter medications on file as of 09/28/2021.    Allergies  as of 09/28/2021 - Review Complete 09/28/2021  Allergen Reaction Noted   Nickel Dermatitis 10/19/2016   Statins  05/10/2021   Tape  02/14/2019   Zoloft [sertraline]  09/04/2020    Past Medical History:  Diagnosis Date   Arthritis    Cancer ([place])    breast CA (resolved)   Hypertension    Migraine     Past Surgical History:  Procedure Laterality Date   BREAST SURGERY     COSMETIC SURGERY     HERNIA REPAIR     REPLACEMENT TOTAL KNEE     WRIST SURGERY      Family History  Problem Relation Age of Onset   Leukemia Mother    Stroke Father    CAD Father    Cancer Father     Social History   Socioeconomic History   Marital status: Widowed    Spouse name: Not on file   Number of children: Not on file   Years of education: Not on file   Highest education level: Not on file  Occupational History   Not on file  Tobacco Use   Smoking status: Never   Smokeless tobacco: Never  Vaping Use   Vaping Use: Never used  Substance and Sexual Activity   Alcohol use: Never   Drug use: Never   Sexual activity: Not on file  Other Topics Concern   Not on file  Social History Narrative  Not on file   Social Determinants of Health   Financial Resource Strain: Not on file  Food Insecurity: Not on file  Transportation Needs: Not on file  Physical Activity: Not on file  Stress: Not on file  Social Connections: Not on file  Intimate Partner Violence: Not on file    Review of Systems  Respiratory:  Positive for apnea.   Psychiatric/Behavioral:  Positive for sleep disturbance.    Vitals:   09/28/21 1110  BP: (!) 180/120  Pulse: (!) 56  Temp: 98 F (36.7 C)  SpO2: 100%     Physical Exam Constitutional:      Appearance: She is obese.  HENT:     Mouth/Throat:     Mouth: Mucous membranes are moist.     Comments: Mallampati 3 Eyes:     Pupils: Pupils are equal, round, and reactive to light.  Cardiovascular:     Rate and Rhythm: Normal rate and regular rhythm.      Heart sounds: No murmur heard.   No friction rub.  Pulmonary:     Effort: No respiratory distress.     Breath sounds: No stridor. No wheezing or rhonchi.  Musculoskeletal:     Cervical back: No rigidity or tenderness.  Neurological:     Mental Status: She is alert.  Psychiatric:        Mood and Affect: Mood normal.     Data Reviewed: Patient's AHI is 15.1 on a sleep study  Compliance reviewed on her cell phone did reveal adequate adequate management of the sleep disordered breathing-mostly AHI less than 1   Assessment:  Moderate obstructive sleep apnea -Tolerating CPAP well -Continue using CPAP  Cardiac dysrhythmia  Difficult to control hypertension  Plan/Recommendations:  Continue CPAP on a nightly basis  Encouraged to give [place] a call if she has any concerns   tentative follow-up in 6 months   Sherrilyn Rist MD [facility] 09/28/2021, 11:23 AM  CC: Francesca Oman, DO

## 2021-09-28 NOTE — Patient Instructions (Signed)
Your CPAP seems to be working well  Continue CPAP on a nightly basis  I will see you back in 6 months  Call us with significant concerns

## 2021-12-08 ENCOUNTER — Telehealth: Payer: Self-pay | Admitting: Pulmonary Disease

## 2021-12-08 DIAGNOSIS — Z9989 Dependence on other enabling machines and devices: Secondary | ICD-10-CM

## 2021-12-08 DIAGNOSIS — G4733 Obstructive sleep apnea (adult) (pediatric): Secondary | ICD-10-CM
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
## 2021-12-08 NOTE — Telephone Encounter (Signed)
Okay with me to place order to go to [facility]

## 2021-12-08 NOTE — Telephone Encounter (Signed)
Dr. Ander Slade, please advise if ok to place order to change DME from [facility] to [facility]. Pt is unhappy with service at [facility].

## 2021-12-08 NOTE — Telephone Encounter (Signed)
Yes

## 2021-12-08 NOTE — Telephone Encounter (Signed)
Order sent to switch to [facility] for her CPAP supplies. Left detailed msg on machine letting her know that this was done. Nothing further needed.

## 2022-04-22 ENCOUNTER — Emergency Department (HOSPITAL_BASED_OUTPATIENT_CLINIC_OR_DEPARTMENT_OTHER)
Admission: EM | Admit: 2022-04-22 | Discharge: 2022-04-23 | Disposition: A | Discharge status: Home / Self Care | Payer: Medicare Other | Attending: Emergency Medicine | Admitting: Emergency Medicine

## 2022-04-22 ENCOUNTER — Emergency Department (HOSPITAL_BASED_OUTPATIENT_CLINIC_OR_DEPARTMENT_OTHER): Payer: Medicare Other

## 2022-04-22 ENCOUNTER — Encounter (HOSPITAL_BASED_OUTPATIENT_CLINIC_OR_DEPARTMENT_OTHER): Payer: Self-pay | Admitting: Emergency Medicine

## 2022-04-22 ENCOUNTER — Other Ambulatory Visit: Payer: Self-pay

## 2022-04-22 DIAGNOSIS — R1084 Generalized abdominal pain: Secondary | ICD-10-CM | POA: Diagnosis not present

## 2022-04-22 DIAGNOSIS — K625 Hemorrhage of anus and rectum: Secondary | ICD-10-CM | POA: Diagnosis not present

## 2022-04-22 DIAGNOSIS — Z7982 Long term (current) use of aspirin: Secondary | ICD-10-CM | POA: Insufficient documentation

## 2022-04-22 DIAGNOSIS — R140 Abdominal distension (gaseous): Secondary | ICD-10-CM | POA: Insufficient documentation

## 2022-04-22 DIAGNOSIS — Z853 Personal history of malignant neoplasm of breast: Secondary | ICD-10-CM | POA: Insufficient documentation

## 2022-04-22 DIAGNOSIS — Z79899 Other long term (current) drug therapy: Secondary | ICD-10-CM | POA: Diagnosis not present

## 2022-04-22 DIAGNOSIS — K5900 Constipation, unspecified: Secondary | ICD-10-CM | POA: Insufficient documentation

## 2022-04-22 LAB — LIPASE, BLOOD: Lipase: 55 U/L — ABNORMAL HIGH (ref 11–51)

## 2022-04-22 LAB — CBC WITH DIFFERENTIAL/PLATELET
Abs Immature Granulocytes: 0.04 10*3/uL (ref 0.00–0.07)
Basophils Absolute: 0.1 10*3/uL (ref 0.0–0.1)
Basophils Relative: 1 %
Eosinophils Absolute: 0.2 10*3/uL (ref 0.0–0.5)
Eosinophils Relative: 2 %
HCT: 44.2 % (ref 36.0–46.0)
Hemoglobin: 14.6 g/dL (ref 12.0–15.0)
Immature Granulocytes: 0 %
Lymphocytes Relative: 21 %
Lymphs Abs: 1.9 10*3/uL (ref 0.7–4.0)
MCH: 29 pg (ref 26.0–34.0)
MCHC: 33 g/dL (ref 30.0–36.0)
MCV: 87.9 fL (ref 80.0–100.0)
Monocytes Absolute: 0.8 10*3/uL (ref 0.1–1.0)
Monocytes Relative: 9 %
Neutro Abs: 6.1 10*3/uL (ref 1.7–7.7)
Neutrophils Relative %: 67 %
Platelets: 297 10*3/uL (ref 150–400)
RBC: 5.03 MIL/uL (ref 3.87–5.11)
RDW: 12.4 % (ref 11.5–15.5)
WBC: 9.1 10*3/uL (ref 4.0–10.5)
nRBC: 0 % (ref 0.0–0.2)

## 2022-04-22 LAB — COMPREHENSIVE METABOLIC PANEL
ALT: 28 U/L (ref 0–44)
AST: 38 U/L (ref 15–41)
Albumin: 4.3 g/dL (ref 3.5–5.0)
Alkaline Phosphatase: 77 U/L (ref 38–126)
Anion gap: 8 (ref 5–15)
BUN: 18 mg/dL (ref 8–23)
CO2: 25 mmol/L (ref 22–32)
Calcium: 9.3 mg/dL (ref 8.9–10.3)
Chloride: 106 mmol/L (ref 98–111)
Creatinine, Ser: 0.68 mg/dL (ref 0.44–1.00)
GFR, Estimated: >60 mL/min (ref >60)
Glucose, Bld: 111 mg/dL — ABNORMAL HIGH (ref 70–99)
Potassium: 3.9 mmol/L (ref 3.5–5.1)
Sodium: 139 mmol/L (ref 135–145)
Total Bilirubin: 0.5 mg/dL (ref 0.3–1.2)
Total Protein: 7.1 g/dL (ref 6.5–8.1)

## 2022-04-22 LAB — OCCULT BLOOD X 1 CARD TO LAB, STOOL: Fecal Occult Bld: NEGATIVE

## 2022-04-22 MED ORDER — MORPHINE SULFATE (PF) 4 MG/ML IV SOLN
4.0000 mg | Freq: Once | INTRAVENOUS | Status: AC
  Administered 2022-04-22: 4 mg via INTRAVENOUS
  Filled 2022-04-22: qty 1

## 2022-04-22 MED ORDER — ONDANSETRON HCL 4 MG/2ML IJ SOLN
4.0000 mg | Freq: Once | INTRAMUSCULAR | Status: AC
  Administered 2022-04-22: 4 mg via INTRAVENOUS
  Filled 2022-04-22: qty 2

## 2022-04-22 MED ORDER — IOHEXOL 300 MG/ML  SOLN
100.0000 mL | Freq: Once | INTRAMUSCULAR | Status: AC | PRN
  Administered 2022-04-22: 100 mL via INTRAVENOUS

## 2022-04-22 MED ORDER — DICYCLOMINE HCL 20 MG PO TABS: 20.0000 mg | ORAL_TABLET | Freq: Two times a day (BID) | ORAL | 0 refills | Status: DC

## 2022-04-22 MED ORDER — ONDANSETRON 4 MG PO TBDP: 4.0000 mg | ORAL_TABLET | Freq: Three times a day (TID) | ORAL | 0 refills | Status: DC | PRN

## 2022-04-22 NOTE — Discharge Instructions (Signed)
Your work-up today was reassuring.  Blood work did not show any concerns.  Your hemoglobin count was stable.  No concern for significant loss of blood due to your rectal bleeding.  CT scan did not show any concerning findings.  It did note fatty liver which you are aware of already.  It also showed diverticulosis which is outpouchings of your colon which can be seen with longstanding constipation.  It also saw moderate amount of stool burden.  I do recommend that you increase your bowel regimen.  Continue taking Metamucil but also add MiraLAX, or Senokot.  I have sent Zofran and Bentyl into the pharmacy for symptom management.  I have given you a referral to gastroenterology.  Follow-up with your primary care provider as needed.  If you have significant worsening in your rectal bleeding, or you get lightheaded you can return for evaluation.

## 2022-04-22 NOTE — ED Provider Notes (Signed)
[facility] Provider Note   CSN: [id number] Arrival date & time: 04/22/22  2103     History  Chief Complaint  Patient presents with   Abdominal Pain   Rectal Bleeding    Kirsten Choi is a 66 y.o. female.  66 year old female presents today for evaluation of abdominal pain ongoing for about 2 weeks however worsened 2 to 3 days ago and is now associated with bloody stools over the past 2 days as well.  She denies fever, chills.  Denies constipation.  Reports regular soft bowel movements daily.  Denies nausea or vomiting.  Pain is severe.  She does have a history of breast cancer.  History of cholecystectomy, and abdominal muscle trimming or breast reconstruction.  Plans for abdominal ultrasound on Monday however states she cannot wait that long.  The history is provided by the patient. No language interpreter was used.       Home Medications Prior to Admission medications   Medication Sig Start Date End Date Taking? Authorizing Provider  aspirin EC 81 MG tablet Take 81 mg by mouth daily. Swallow whole.    [provider]  Cholecalciferol 25 MCG (1000 UT) tablet Take by mouth.    [provider]  ezetimibe (ZETIA) 10 MG tablet Take 1 tablet by mouth daily. 05/30/21   [provider]  lisinopril (ZESTRIL) 40 MG tablet Take 40 mg by mouth daily. 07/14/21   [provider]  Multiple Vitamins-Minerals (MULTIPLE VITAMINS/WOMENS) tablet  06/13/21   [provider]  psyllium (METAMUCIL) 58.6 % packet Take 1 packet by mouth daily.    [provider]      Allergies    Nickel, Statins, Tape, and Zoloft [sertraline]    Review of Systems   Review of Systems  Constitutional:  Negative for chills and fever.  Gastrointestinal:  Positive for abdominal pain and blood in stool. Negative for constipation, diarrhea, nausea and vomiting.  Genitourinary:  Negative for dysuria.  Neurological:  Negative for  light-headedness.  All other systems reviewed and are negative.   Physical Exam Updated Vital Signs BP (!) 167/95 (BP Location: Left Arm)   Pulse 78   Temp 98.3 F (36.8 C) (Oral)   Resp (!) 22   SpO2 99%  Physical Exam Vitals and nursing note reviewed.  Constitutional:      General: She is not in acute distress.    Appearance: Normal appearance. She is not ill-appearing.  HENT:     Head: Normocephalic and atraumatic.     Nose: Nose normal.  Eyes:     General: No scleral icterus.    Extraocular Movements: Extraocular movements intact.     Conjunctiva/sclera: Conjunctivae normal.  Cardiovascular:     Rate and Rhythm: Normal rate and regular rhythm.     Pulses: Normal pulses.     Heart sounds: Normal heart sounds.  Pulmonary:     Effort: Pulmonary effort is normal. No respiratory distress.     Breath sounds: Normal breath sounds. No wheezing or rales.  Abdominal:     General: Abdomen is protuberant. There is distension.     Tenderness: There is generalized abdominal tenderness. There is no guarding.  Musculoskeletal:        General: Normal range of motion.     Cervical back: Normal range of motion.  Skin:    General: Skin is warm and dry.  Neurological:     General: No focal deficit present.     Mental Status: She  is alert. Mental status is at baseline.     ED Results / Procedures / Treatments   Labs (all labs ordered are listed, but only abnormal results are displayed) Labs Reviewed  COMPREHENSIVE METABOLIC PANEL - Abnormal; Notable for the following components:      Result Value   Glucose, Bld 111 (*)    All other components within normal limits  LIPASE, BLOOD - Abnormal; Notable for the following components:   Lipase 55 (*)    All other components within normal limits  CBC WITH DIFFERENTIAL/PLATELET  URINALYSIS, ROUTINE W REFLEX MICROSCOPIC    EKG None  Radiology No results found.  Procedures Procedures    Medications Ordered in ED Medications   morphine (PF) 4 MG/ML injection 4 mg (has no administration in time range)  ondansetron (ZOFRAN) injection 4 mg (has no administration in time range)    ED Course/ Medical Decision Making/ A&P                           Medical Decision Making Amount and/or Complexity of Data Reviewed Labs: ordered. Radiology: ordered.  Risk Prescription drug management.   Medical Decision Making / ED Course   This patient presents to the ED for concern of abdominal pain, this involves an extensive number of treatment options, and is a complaint that carries with it a high risk of complications and morbidity.  The differential diagnosis includes constipation, diverticulitis, pancreatitis, appendicitis, constipation, malignancy  MDM: 66 year old female presents today for evaluation of abdominal pain that worsened over the past couple days associated with rectal bleeding but has been ongoing now for about 2 weeks.  She reports daily bowel movements.  Blood is mixed in with her stool.  Not large-volume.  She is afebrile and without acute distress.  She is uncomfortable due to pain.  Has some distention on exam.  CBC reveals no leukocytosis or anemia.  CMP is unremarkable with the exception of glucose of 111.  Lipase of 55.  Fecal occult blood test is negative.  CT abdomen pelvis does not show any acute intra-abdomina findings.  It does make note of fatty liver which patient is aware of already, and diverticulosis.  She was not aware of this.  She does have chronic constipation so this is likely associated with that.  No concern for significant GI bleed given stable hemoglobin.  Patient reports compliance with her screening colonoscopies.  States she gets these done every 5 years and recently had a 2 years ago and was reportedly normal.  Given work-up discussed increasing her bowel regimen.  Will prescribe patient Zofran and Bentyl for symptom management.  We will provide her gastroenterology referral.  Discussed  follow-up with PCP.  Patient voices understanding and is in agreement with plan.  Lab Tests: -I ordered, reviewed, and interpreted labs.   The pertinent results include:   Labs Reviewed  COMPREHENSIVE METABOLIC PANEL - Abnormal; Notable for the following components:      Result Value   Glucose, Bld 111 (*)    All other components within normal limits  LIPASE, BLOOD - Abnormal; Notable for the following components:   Lipase 55 (*)    All other components within normal limits  CBC WITH DIFFERENTIAL/PLATELET  OCCULT BLOOD X 1 CARD TO LAB, STOOL  URINALYSIS, ROUTINE W REFLEX MICROSCOPIC      EKG  EKG Interpretation  Date/Time:  Saturday April 22 2022 21:32:07 EDT Ventricular Rate:  76 PR Interval:  140 QRS Duration: 80 QT Interval:  378 QTC Calculation: 425 R Axis:   8 Text Interpretation: Normal sinus rhythm Low voltage QRS Borderline ECG When compared with ECG of 04-Sep-2020 09:40, PREVIOUS ECG IS PRESENT Confirmed by Lennice Sites ([id number]) on 04/22/2022 10:08:47 PM         Imaging Studies ordered: I ordered imaging studies including CT abdomen pelvis I independently visualized and interpreted imaging. I agree with the radiologist interpretation   Medicines ordered and prescription drug management: Meds ordered this encounter  Medications   morphine (PF) 4 MG/ML injection 4 mg   ondansetron (ZOFRAN) injection 4 mg   iohexol (OMNIPAQUE) 300 MG/ML solution 100 mL    -I have reviewed the patients home medicines and have made adjustments as needed  Reevaluation: After the interventions noted above, I reevaluated the patient and found that they have :improved Significant improvement in pain following pain medication. Co morbidities that complicate the patient evaluation  Past Medical History:  Diagnosis Date   Arthritis    Cancer ([place])    breast CA (resolved)   Hypertension    Migraine       Dispostion: Patient is appropriate for discharge.  Discharged in  stable condition.  Return precautions discussed.  Patient voices understanding and is in agreement with plan.  Final Clinical Impression(s) / ED Diagnoses Final diagnoses:  Constipation, unspecified constipation type  Generalized abdominal pain  Rectal bleeding    Rx / DC Orders ED Discharge Orders          Ordered    dicyclomine (BENTYL) 20 MG tablet  2 times daily        04/22/22 2359    ondansetron (ZOFRAN-ODT) 4 MG disintegrating tablet  Every 8 hours PRN        04/22/22 2359              Evlyn Courier, PA-C 04/23/22 0005    Lennice Sites, DO 04/23/22 1714

## 2022-04-22 NOTE — ED Triage Notes (Signed)
Constant right sided sharp stabbing abdominal pain that worsened over the past few days. Also endorses bloody stools and bloating. Rigid abdominal with distention upon assessment. Denies urinary sx.

## 2022-05-16 LAB — HM COLONOSCOPY

## 2022-06-14 ENCOUNTER — Telehealth: Payer: Self-pay | Admitting: *Deleted

## 2022-06-14 NOTE — Telephone Encounter (Signed)
Called and spoke with [employer] for a second time regarding download needed for today's visit.  The download was faxed to the wrong #, it was faxed to [facility] and we do not have a [facility] office.  Advised to fax to [phone].  They will fax it again to the correct #.  Patient has CPAP machine. Patient uses [employer] for CPAP machine. [employer] should be sending reports. Patient phone number is [phone].  ATC patient regarding CPAP, had to leave a VM to return call.  When she calls back, I am trying to find out if she has a CPAP and if she is wearing it.  Did not receive access to airview account.  Called [employer] on 06/15/2022, advised they would give access to airview.  Will await access to airview.

## 2022-06-14 NOTE — Telephone Encounter (Signed)
Patient has CPAP machine. Patient uses [employer] for CPAP machine. [employer] should be sending reports. Patient phone number is [phone].

## 2022-06-15 ENCOUNTER — Encounter: Payer: Self-pay | Admitting: Adult Health

## 2022-06-15 ENCOUNTER — Ambulatory Visit (INDEPENDENT_AMBULATORY_CARE_PROVIDER_SITE_OTHER): Payer: Medicare Other | Admitting: Adult Health

## 2022-06-15 ENCOUNTER — Ambulatory Visit (HOSPITAL_COMMUNITY)
Admission: RE | Admit: 2022-06-15 | Discharge: 2022-06-15 | Disposition: A | Discharge status: Home / Self Care | Payer: Medicare Other | Source: Ambulatory Visit | Attending: Adult Health | Admitting: Adult Health

## 2022-06-15 VITALS — BP 160/100 | HR 78 | Temp 98.0°F | Ht 64.0 in | Wt 221.4 lb

## 2022-06-15 DIAGNOSIS — G4733 Obstructive sleep apnea (adult) (pediatric): Secondary | ICD-10-CM

## 2022-06-15 DIAGNOSIS — R0600 Dyspnea, unspecified: Secondary | ICD-10-CM | POA: Insufficient documentation

## 2022-06-15 DIAGNOSIS — R0609 Other forms of dyspnea: Secondary | ICD-10-CM | POA: Diagnosis not present

## 2022-06-15 LAB — POCT I-STAT CREATININE: Creatinine, Ser: 0.8 mg/dL (ref 0.44–1.00)

## 2022-06-15 MED ORDER — IOHEXOL 350 MG/ML SOLN
100.0000 mL | Freq: Once | INTRAVENOUS | Status: AC | PRN
  Administered 2022-06-15: 80 mL via INTRAVENOUS

## 2022-06-15 MED ORDER — ALBUTEROL SULFATE HFA 108 (90 BASE) MCG/ACT IN AERS: 1.0000 | INHALATION_SPRAY | Freq: Four times a day (QID) | RESPIRATORY_TRACT | 2 refills | Status: DC | PRN

## 2022-06-15 NOTE — Progress Notes (Signed)
$'@Patient'V$  ID: Kirsten Choi, female    DOB: [birth year], 66 y.o.   MRN: [id number]  Chief Complaint  Patient presents with   Follow-up    Referring provider: Francesca Oman, DO  HPI: 66 year old female followed for sleep apnea  TEST/EVENTS :  AHI of 15.1  06/15/2022 Follow up: OSA  Patient presents for a follow-up visit.  Patient has sleep apnea is on nocturnal CPAP.  Patient says she is doing well on CPAP.  Feels that she benefits with decreased daytime sleepiness CPAP download shows excellent compliance with daily average usage 6 hours daily.  Patient is on auto CPAP 5 to 15 cm H2O.  Daily average pressure at 9 cm H2O.  AHI 0.9.  Complains today that over 4 week has become acutely short of breath.  Normally walks 5 miles a day ,  several times a week but now can not walk short distance without becoming winded. Went to Cardiology this past month, was felt breathing problems were not secondary to underlying cardiac issue has history of SVT and hypertension.  Was changed from Norvasc to Coreg due to pedal edema Recent treated for H Pylori with 2 weeks of antibiotics. Has ongoing GI issues , upset stomach, gas and bloating  Chest xray June 02, 2022 was clear. No cough . Chest tightness. No calf pain. No hemoptysis .  History of APS "sticky blood syndrome" . No known VTE  hx or CVA hx .   No recent infection. Has never tested positive for Covid 19.  Was going on trip to [place] but canceled due to dysnea is so bad.  Breathing issues started after Endoscopy 1 year ago.  Walk test in the office today shows no significant desaturations.  O2 saturations remained 93% and above on room air.  Never smoker,. No history Asthma or COPD.  Born premature 2 months early , in NICU long time . Normal childhood, no known limitations.  Retired from [employer] . No known occupational exposures. No  No autoimmune diseases. No history   Breast cancer bilateral -2001 s/p mastectomy and re-construction. No XRT  . Armidex x 5 years.  Received Chemo .  Allergies  Allergen Reactions   Nickel Dermatitis   Statins     Liver damage   Tape    Zoloft [Sertraline]     Immunization History  Administered Date(s) Administered   Influenza, High Dose Seasonal PF 09/20/2020, 08/10/2021   Moderna Covid-19 Vaccine Bivalent Booster 26yr & up 08/24/2021   Moderna Sars-Covid-2 Vaccination 03/30/2021   PFIZER(Purple Top)SARS-COV-2 Vaccination 01/22/2020, 02/16/2020, 08/28/2020   PNEUMOCOCCAL CONJUGATE-20 02/09/2022   Pneumococcal Polysaccharide-23 12/07/2020    Past Medical History:  Diagnosis Date   Arthritis    [place]    breast CA (resolved)   Hypertension    Migraine     Tobacco History: Social History   Tobacco Use  Smoking Status Never  Smokeless Tobacco Never   Counseling given: Not Answered   Outpatient Medications Prior to Visit  Medication Sig Dispense Refill   aspirin EC 81 MG tablet Take 81 mg by mouth daily. Swallow whole.     carvedilol (COREG) 12.5 MG tablet Take by mouth.     Cholecalciferol 25 MCG (1000 UT) tablet Take by mouth.     ezetimibe (ZETIA) 10 MG tablet Take 1 tablet by mouth daily.     Famotidine-Ca Carb-Mag Hydrox (PEPCID COMPLETE PO) Take 1 tablet by mouth daily.     Lactobacillus (PROBIOTIC ACIDOPHILUS PO) Take 1 tablet  by mouth daily.     lisinopril (ZESTRIL) 40 MG tablet Take 40 mg by mouth daily.     omeprazole (PRILOSEC) 40 MG capsule Take 40 mg by mouth 2 (two) times daily.     psyllium (METAMUCIL) 58.6 % packet Take 1 packet by mouth daily.     dicyclomine (BENTYL) 20 MG tablet Take 1 tablet (20 mg total) by mouth 2 (two) times daily. (Patient not taking: Reported on 06/15/2022) 20 tablet 0   Multiple Vitamins-Minerals (MULTIPLE VITAMINS/WOMENS) tablet  (Patient not taking: Reported on 06/15/2022)     ondansetron (ZOFRAN-ODT) 4 MG disintegrating tablet Take 1 tablet (4 mg total) by mouth every 8 (eight) hours as needed. (Patient not taking: Reported  on 06/15/2022) 20 tablet 0   No facility-administered medications prior to visit.     Review of Systems:   Constitutional:   No  weight loss, night sweats,  Fevers, chills,  +fatigue, or  lassitude.  HEENT:   No headaches,  Difficulty swallowing,  Tooth/dental problems, or  Sore throat,                No sneezing, itching, ear ache, nasal congestion, post nasal drip,   CV:  No chest pain,  Orthopnea, PND, swelling in lower extremities, anasarca, dizziness, palpitations, syncope.   GI  No heartburn, indigestion, abdominal pain, nausea, vomiting, diarrhea, change in bowel habits, loss of appetite, bloody stools.   Resp: .  No chest wall deformity  Skin: no rash or lesions.  GU: no dysuria, change in color of urine, no urgency or frequency.  No flank pain, no hematuria   MS:  No joint pain or swelling.  No decreased range of motion.  No back pain.    Physical Exam  BP (!) 160/100 (BP Location: Left Arm, Patient Position: Sitting, Cuff Size: Large)   Pulse 78   Temp 98 F (36.7 C) (Oral)   Ht '5\' 4"'$  (1.626 m)   Wt 221 lb 6.4 oz (100.4 kg)   SpO2 99%   BMI 38.00 kg/m   GEN: A/Ox3; pleasant , NAD, well nourished    HEENT:  [place]/AT,  EACs-clear, TMs-wnl, NOSE-clear, THROAT-clear, no lesions, no postnasal drip or exudate noted. Class 2-3 MP airway   NECK:  Supple w/ fair ROM; no JVD; normal carotid impulses w/o bruits; no thyromegaly or nodules palpated; no lymphadenopathy.    RESP  Clear  P & A; w/o, wheezes/ rales/ or rhonchi. no accessory muscle use, no dullness to percussion  CARD:  RRR, no m/r/g, tr  peripheral edema, pulses intact, no cyanosis or clubbing. Neg homans sign .   GI:   Soft & nt; nml bowel sounds; no organomegaly or masses detected.   Musco: Warm bil, no deformities or joint swelling noted.   Neuro: alert, no focal deficits noted.    Skin: Warm, no lesions or rashes    Lab Results:  CBC    Component Value Date/Time   WBC 9.1 04/22/2022 2138    RBC 5.03 04/22/2022 2138   HGB 14.6 04/22/2022 2138   HCT 44.2 04/22/2022 2138   PLT 297 04/22/2022 2138   MCV 87.9 04/22/2022 2138   MCH 29.0 04/22/2022 2138   MCHC 33.0 04/22/2022 2138   RDW 12.4 04/22/2022 2138   LYMPHSABS 1.9 04/22/2022 2138   MONOABS 0.8 04/22/2022 2138   EOSABS 0.2 04/22/2022 2138   BASOSABS 0.1 04/22/2022 2138    BMET    Component Value Date/Time   NA 139 04/22/2022  2138   NA 145 (H) 02/26/2020 1420   K 3.9 04/22/2022 2138   CL 106 04/22/2022 2138   CO2 25 04/22/2022 2138   GLUCOSE 111 (H) 04/22/2022 2138   BUN 18 04/22/2022 2138   BUN 16 02/26/2020 1420   CREATININE 0.80 06/15/2022 1713   CALCIUM 9.3 04/22/2022 2138   GFRNONAA >60 04/22/2022 2138   GFRAA 74 02/26/2020 1420    BNP No results found for: "BNP"  ProBNP No results found for: "PROBNP"  Imaging: No results found.        No data to display          No results found for: "NITRICOXIDE"      Assessment & Plan:   OSA (obstructive sleep apnea) Excellent control and compliance on nocturnal CPAP.  Continue on current settings.  Plan  Patient Instructions  Set up for Stat CT chest -PE protocol today .  Albuterol inhaler As needed   Activity as tolerated   Keep up the good work Continue on CPAP at bedtime Work on healthy weight loss Do not drive if sleepy  Follow up with Dr. Ander Slade or Analilia Geddis NP in 4 weeks with PFTs and As needed  Please contact office for sooner follow up if symptoms do not improve or worsen or seek emergency care         Dyspnea Dyspnea with exertion with decreased activity tolerance.  This been going on for approximately 4 weeks questionable etiology.  Recent cardiology evaluation unrevealing.  Patient has no associated cough or congestion.  Symptoms seem to have started after recent endoscopy.  Seem to be acute in nature as patient was very active and now with significant shortness of breath with minimum activity.  No exertional  desaturations in the office on room air today.  Exam is unrevealing.  Chest x-ray was clear.   There is some concern of underlying possible increased risk of VTE.  Will set patient up for a CT chest PE protocol for later today.  Also will set patient up for pulmonary function testing..  Low suspicion for underlying asthma or reactive airways.  Will give an albuterol inhaler to have as needed.  Plan  Patient Instructions  Set up for Stat CT chest -PE protocol today .  Albuterol inhaler As needed   Activity as tolerated   Keep up the good work Continue on CPAP at bedtime Work on healthy weight loss Do not drive if sleepy  Follow up with Dr. Ander Slade or Gorje Iyer NP in 4 weeks with PFTs and As needed  Please contact office for sooner follow up if symptoms do not improve or worsen or seek emergency care          I spent   43 minutes dedicated to the care of this patient on the date of this encounter to include pre-visit review of records, face-to-face time with the patient discussing conditions above, post visit ordering of testing, clinical documentation with the electronic health record, making appropriate referrals as documented, and communicating necessary findings to members of the patients care team.     Rexene Edison, NP 06/15/2022

## 2022-06-15 NOTE — Patient Instructions (Addendum)
Set up for Stat CT chest -PE protocol today .  Albuterol inhaler As needed   Activity as tolerated   Keep up the good work Continue on CPAP at bedtime Work on healthy weight loss Do not drive if sleepy  Follow up with Dr. Ander Slade or Jabes Primo NP in 4 weeks with PFTs and As needed  Please contact office for sooner follow up if symptoms do not improve or worsen or seek emergency care

## 2022-06-15 NOTE — Telephone Encounter (Signed)
Patient has a Luna CPAP, download obtained.  Nothing further needed.

## 2022-06-15 NOTE — Assessment & Plan Note (Signed)
Dyspnea with exertion with decreased activity tolerance.  This been going on for approximately 4 weeks questionable etiology.  Recent cardiology evaluation unrevealing.  Patient has no associated cough or congestion.  Symptoms seem to have started after recent endoscopy.  Seem to be acute in nature as patient was very active and now with significant shortness of breath with minimum activity.  No exertional desaturations in the office on room air today.  Exam is unrevealing.  Chest x-ray was clear.   There is some concern of underlying possible increased risk of VTE.  Will set patient up for a CT chest PE protocol for later today.  Also will set patient up for pulmonary function testing..  Low suspicion for underlying asthma or reactive airways.  Will give an albuterol inhaler to have as needed.  Plan  Patient Instructions  Set up for Stat CT chest -PE protocol today .  Albuterol inhaler As needed   Activity as tolerated   Keep up the good work Continue on CPAP at bedtime Work on healthy weight loss Do not drive if sleepy  Follow up with Dr. Ander Slade or Dellia Donnelly NP in 4 weeks with PFTs and As needed  Please contact office for sooner follow up if symptoms do not improve or worsen or seek emergency care

## 2022-06-15 NOTE — Assessment & Plan Note (Signed)
Excellent control and compliance on nocturnal CPAP.  Continue on current settings.  Plan  Patient Instructions  Set up for Stat CT chest -PE protocol today .  Albuterol inhaler As needed   Activity as tolerated   Keep up the good work Continue on CPAP at bedtime Work on healthy weight loss Do not drive if sleepy  Follow up with Dr. Ander Slade or Denzell Colasanti NP in 4 weeks with PFTs and As needed  Please contact office for sooner follow up if symptoms do not improve or worsen or seek emergency care

## 2022-06-22 ENCOUNTER — Ambulatory Visit (INDEPENDENT_AMBULATORY_CARE_PROVIDER_SITE_OTHER): Payer: Medicare Other | Admitting: Pulmonary Disease

## 2022-06-22 DIAGNOSIS — R0609 Other forms of dyspnea: Secondary | ICD-10-CM | POA: Diagnosis not present

## 2022-06-22 LAB — PULMONARY FUNCTION TEST
DL/VA % pred: 102 %
DL/VA: 4.28 ml/min/mmHg/L
DLCO cor % pred: 99 %
DLCO cor: 19.49 ml/min/mmHg
DLCO unc % pred: 102 %
DLCO unc: 20.23 ml/min/mmHg
FEF 25-75 Post: 2.33 L/sec
FEF 25-75 Pre: 1.9 L/sec
FEF2575-%Change-Post: 22 %
FEF2575-%Pred-Post: 114 %
FEF2575-%Pred-Pre: 92 %
FEV1-%Change-Post: 5 %
FEV1-%Pred-Post: 89 %
FEV1-%Pred-Pre: 84 %
FEV1-Post: 2.11 L
FEV1-Pre: 2 L
FEV1FVC-%Change-Post: 2 %
FEV1FVC-%Pred-Pre: 105 %
FEV6-%Change-Post: 2 %
FEV6-%Pred-Post: 85 %
FEV6-%Pred-Pre: 82 %
FEV6-Post: 2.53 L
FEV6-Pre: 2.46 L
FEV6FVC-%Pred-Post: 104 %
FEV6FVC-%Pred-Pre: 104 %
FVC-%Change-Post: 2 %
FVC-%Pred-Post: 81 %
FVC-%Pred-Pre: 79 %
FVC-Post: 2.53 L
FVC-Pre: 2.47 L
Post FEV1/FVC ratio: 83 %
Post FEV6/FVC ratio: 100 %
Pre FEV1/FVC ratio: 81 %
Pre FEV6/FVC Ratio: 100 %
RV % pred: 111 %
RV: 2.37 L
TLC % pred: 102 %
TLC: 5.18 L

## 2022-06-22 NOTE — Progress Notes (Signed)
Full PFT Performed Today  

## 2022-06-22 NOTE — Patient Instructions (Signed)
Full PFT Performed Today  

## 2022-06-29 ENCOUNTER — Encounter: Payer: Self-pay | Admitting: Pulmonary Disease

## 2022-06-29 ENCOUNTER — Ambulatory Visit (INDEPENDENT_AMBULATORY_CARE_PROVIDER_SITE_OTHER): Payer: Medicare Other | Admitting: Pulmonary Disease

## 2022-06-29 VITALS — BP 144/80 | HR 85 | Ht 64.0 in | Wt 217.6 lb

## 2022-06-29 DIAGNOSIS — Z9989 Dependence on other enabling machines and devices: Secondary | ICD-10-CM

## 2022-06-29 DIAGNOSIS — G4733 Obstructive sleep apnea (adult) (pediatric): Secondary | ICD-10-CM | POA: Diagnosis not present

## 2022-06-29 DIAGNOSIS — R0602 Shortness of breath: Secondary | ICD-10-CM

## 2022-06-29 NOTE — Progress Notes (Signed)
Kirsten Choi    [id number]    10/22/1956  Primary Care Physician:Wilson, Myrtis Hopping, DO  Referring Physician: Francesca Oman, DO [address] [place],  [place] [address]  Chief complaint:   Patient being seen for obstructive sleep apnea Recently had PFT  HPI:  PFT was reviewed with the patient showing no obstruction, no significant bronchodilator response, no restriction, normal diffusing capacity  She continues to use her CPAP on a nightly basis with no significant problems  She has moderate obstructive sleep apnea on CPAP therapy  Occasionally does have some burning in her chest, does have a history of reflux, H. pylori which was recently treated on GERD . Did have some issues with her machine, this has been fixed Tolerating CPAP well  Seems to be getting adequate sleep at night and waking up feeling rested  Usually goes to bed about 10 PM, wakes up at 5 AM 5 minutes to fall asleep 3-4 awakenings  Weight is up about 50 pounds  She does have a history of dysrhythmia as Difficult to control blood pressure   Outpatient Encounter Medications as of 06/29/2022  Medication Sig   albuterol (VENTOLIN HFA) 108 (90 Base) MCG/ACT inhaler Inhale 1-2 puffs into the lungs every 6 (six) hours as needed.   amLODipine (NORVASC) 5 MG tablet Take 5 mg by mouth daily.   aspirin EC 81 MG tablet Take 81 mg by mouth daily. Swallow whole.   Cholecalciferol 25 MCG (1000 UT) tablet Take by mouth.   ezetimibe (ZETIA) 10 MG tablet Take 1 tablet by mouth daily.   Famotidine-Ca Carb-Mag Hydrox (PEPCID COMPLETE PO) Take 1 tablet by mouth daily.   Lactobacillus (PROBIOTIC ACIDOPHILUS PO) Take 1 tablet by mouth daily.   lisinopril (ZESTRIL) 40 MG tablet Take 40 mg by mouth daily.   omeprazole (PRILOSEC) 40 MG capsule Take 40 mg by mouth 2 (two) times daily.   psyllium (METAMUCIL) 58.6 % packet Take 1 packet by mouth daily.   carvedilol (COREG) 12.5 MG tablet Take by mouth.    No facility-administered encounter medications on file as of 06/29/2022.    Allergies as of 06/29/2022 - Review Complete 06/29/2022  Allergen Reaction Noted   Nickel Dermatitis 10/19/2016   Statins  05/10/2021   Tape  02/14/2019   Zoloft [sertraline]  09/04/2020    Past Medical History:  Diagnosis Date   Arthritis    Cancer ([place])    breast CA (resolved)   Hypertension    Migraine     Past Surgical History:  Procedure Laterality Date   BREAST SURGERY     COSMETIC SURGERY     HERNIA REPAIR     REPLACEMENT TOTAL KNEE     WRIST SURGERY      Family History  Problem Relation Age of Onset   Leukemia Mother    Stroke Father    CAD Father    Cancer Father     Social History   Socioeconomic History   Marital status: Widowed    Spouse name: Not on file   Number of children: Not on file   Years of education: Not on file   Highest education level: Not on file  Occupational History   Not on file  Tobacco Use   Smoking status: Never   Smokeless tobacco: Never  Vaping Use   Vaping Use: Never used  Substance and Sexual Activity   Alcohol use: Never   Drug use: Never  Sexual activity: Not on file  Other Topics Concern   Not on file  Social History Narrative   Not on file   Social Determinants of Health   Financial Resource Strain: Not on file  Food Insecurity: Not on file  Transportation Needs: Not on file  Physical Activity: Not on file  Stress: Not on file  Social Connections: Not on file  Intimate Partner Violence: Not on file    Review of Systems  Respiratory:  Positive for apnea.   Psychiatric/Behavioral:  Positive for sleep disturbance.     Vitals:   06/29/22 1544  BP: (!) 144/80  Pulse: 85  SpO2: 98%     Physical Exam Constitutional:      Appearance: She is obese.  HENT:     Mouth/Throat:     Mouth: Mucous membranes are moist.     Comments: Mallampati 3 Eyes:     Pupils: Pupils are equal, round, and reactive to light.   Cardiovascular:     Rate and Rhythm: Normal rate and regular rhythm.     Heart sounds: No murmur heard.    No friction rub.  Pulmonary:     Effort: No respiratory distress.     Breath sounds: No stridor. No wheezing or rhonchi.  Musculoskeletal:     Cervical back: No rigidity or tenderness.  Neurological:     Mental Status: She is alert.  Psychiatric:        Mood and Affect: Mood normal.      Data Reviewed: Patient's AHI is 15.1 on a sleep study  Compliance reviewed on her cell phone did reveal adequate adequate management of the sleep disordered breathing-mostly AHI less than 1  PFT with no obstruction, no restriction, normal diffusing capacity   Assessment:  Moderate obstructive sleep apnea -Tolerating CPAP well -Continue using CPAP  Cardiac dysrhythmia  Difficult to control hypertension  Shortness of breath seems better  Plan/Recommendations:  Continue CPAP on a nightly basis  Encouraged to give [place] a call if she has any concerns  Use albuterol as needed  tentative follow-up in 6 months   Sherrilyn Rist MD [facility] 06/29/2022, 4:08 PM  CC: Francesca Oman, DO

## 2022-06-29 NOTE — Patient Instructions (Signed)
Your breathing study today is completely within normal limits  Use albuterol as needed up to 4 times a day as needed  Continue graded exercises  Continue using your CPAP on a nightly basis  You can try mouth tape-it helps keep the lips together, it will not affect you getting good air movement  Follow-up in 6 months

## 2022-06-30 ENCOUNTER — Ambulatory Visit: Payer: Medicare Other | Admitting: Adult Health

## 2022-08-01 ENCOUNTER — Ambulatory Visit: Payer: Medicare Other | Admitting: Pulmonary Disease

## 2022-09-07 LAB — HM MAMMOGRAPHY

## 2022-12-05 ENCOUNTER — Emergency Department (HOSPITAL_BASED_OUTPATIENT_CLINIC_OR_DEPARTMENT_OTHER)
Admission: EM | Admit: 2022-12-05 | Discharge: 2022-12-06 | Disposition: A | Discharge status: Home / Self Care | Payer: Medicare Other | Attending: Emergency Medicine | Admitting: Emergency Medicine

## 2022-12-05 ENCOUNTER — Other Ambulatory Visit: Payer: Self-pay

## 2022-12-05 DIAGNOSIS — Z79899 Other long term (current) drug therapy: Secondary | ICD-10-CM | POA: Diagnosis not present

## 2022-12-05 DIAGNOSIS — R1031 Right lower quadrant pain: Secondary | ICD-10-CM | POA: Diagnosis not present

## 2022-12-05 DIAGNOSIS — Z7982 Long term (current) use of aspirin: Secondary | ICD-10-CM | POA: Diagnosis not present

## 2022-12-05 DIAGNOSIS — R1084 Generalized abdominal pain: Secondary | ICD-10-CM | POA: Diagnosis not present

## 2022-12-05 DIAGNOSIS — Z853 Personal history of malignant neoplasm of breast: Secondary | ICD-10-CM | POA: Insufficient documentation

## 2022-12-05 DIAGNOSIS — R1032 Left lower quadrant pain: Secondary | ICD-10-CM | POA: Diagnosis not present

## 2022-12-05 DIAGNOSIS — I10 Essential (primary) hypertension: Secondary | ICD-10-CM | POA: Diagnosis not present

## 2022-12-05 LAB — COMPREHENSIVE METABOLIC PANEL
ALT: 34 U/L (ref 0–44)
AST: 26 U/L (ref 15–41)
Albumin: 4.2 g/dL (ref 3.5–5.0)
Alkaline Phosphatase: 95 U/L (ref 38–126)
Anion gap: 8 (ref 5–15)
BUN: 20 mg/dL (ref 8–23)
CO2: 23 mmol/L (ref 22–32)
Calcium: 8.9 mg/dL (ref 8.9–10.3)
Chloride: 106 mmol/L (ref 98–111)
Creatinine, Ser: 0.73 mg/dL (ref 0.44–1.00)
GFR, Estimated: >60 mL/min (ref >60)
Glucose, Bld: 125 mg/dL — ABNORMAL HIGH (ref 70–99)
Potassium: 3.9 mmol/L (ref 3.5–5.1)
Sodium: 137 mmol/L (ref 135–145)
Total Bilirubin: 0.6 mg/dL (ref 0.3–1.2)
Total Protein: 7.3 g/dL (ref 6.5–8.1)

## 2022-12-05 LAB — URINALYSIS, MICROSCOPIC (REFLEX)

## 2022-12-05 LAB — CBC
HCT: 41.4 % (ref 36.0–46.0)
Hemoglobin: 13.6 g/dL (ref 12.0–15.0)
MCH: 27.8 pg (ref 26.0–34.0)
MCHC: 32.9 g/dL (ref 30.0–36.0)
MCV: 84.7 fL (ref 80.0–100.0)
Platelets: 304 10*3/uL (ref 150–400)
RBC: 4.89 MIL/uL (ref 3.87–5.11)
RDW: 12.9 % (ref 11.5–15.5)
WBC: 9.4 10*3/uL (ref 4.0–10.5)
nRBC: 0 % (ref 0.0–0.2)

## 2022-12-05 LAB — URINALYSIS, ROUTINE W REFLEX MICROSCOPIC
Bilirubin Urine: NEGATIVE
Glucose, UA: NEGATIVE mg/dL
Ketones, ur: NEGATIVE mg/dL
Leukocytes,Ua: NEGATIVE
Nitrite: NEGATIVE
Protein, ur: NEGATIVE mg/dL
Specific Gravity, Urine: <=1.005 (ref 1.005–1.030)
pH: 6 (ref 5.0–8.0)

## 2022-12-05 LAB — LIPASE, BLOOD: Lipase: 56 U/L — ABNORMAL HIGH (ref 11–51)

## 2022-12-05 MED ORDER — SODIUM CHLORIDE 0.9 % IV BOLUS
1000.0000 mL | Freq: Once | INTRAVENOUS | Status: AC
  Administered 2022-12-06: 1000 mL via INTRAVENOUS

## 2022-12-05 MED ORDER — FENTANYL CITRATE PF 50 MCG/ML IJ SOSY
50.0000 ug | PREFILLED_SYRINGE | Freq: Once | INTRAMUSCULAR | Status: AC
  Administered 2022-12-06: 50 ug via INTRAVENOUS
  Filled 2022-12-05: qty 1

## 2022-12-05 NOTE — ED Notes (Signed)
ED Provider at bedside. 

## 2022-12-05 NOTE — ED Triage Notes (Addendum)
Pt with abdominal pain described as "stabbing" that began in her left lower quadrant but now is diffusely all over her abdomen.  No n/v/d  No urinary symptoms.  Pt has had 2 abdominal surgeries and reports she has abdominal mesh present.

## 2022-12-06 ENCOUNTER — Emergency Department (HOSPITAL_BASED_OUTPATIENT_CLINIC_OR_DEPARTMENT_OTHER): Payer: Medicare Other

## 2022-12-06 DIAGNOSIS — R1031 Right lower quadrant pain: Secondary | ICD-10-CM | POA: Diagnosis not present

## 2022-12-06 MED ORDER — SIMETHICONE 40 MG/0.6ML PO SUSP (UNIT DOSE)
40.0000 mg | Freq: Once | ORAL | Status: AC
  Administered 2022-12-06: 40 mg via ORAL
  Filled 2022-12-06: qty 0.6

## 2022-12-06 MED ORDER — IOHEXOL 300 MG/ML  SOLN
100.0000 mL | Freq: Once | INTRAMUSCULAR | Status: AC | PRN
  Administered 2022-12-06: 100 mL via INTRAVENOUS

## 2022-12-06 MED ORDER — SIMETHICONE 80 MG PO CHEW: 80.0000 mg | CHEWABLE_TABLET | Freq: Four times a day (QID) | ORAL | 0 refills | Status: DC | PRN

## 2022-12-06 NOTE — ED Notes (Signed)
Pt provided water at this time per pt request; cleared with Dr. Dayna Barker already

## 2022-12-06 NOTE — ED Notes (Signed)
Pt agreeable with d/c plan as discussed by provider- this nurse has verbally reinforced d/c instructions and provided pt with written copy.  Pt acknowledges verbal understanding and denies any addl questions concerns needs- pt ambulatory independently at d/c; no acute changes/distress noted prior to d/c home

## 2022-12-06 NOTE — ED Provider Notes (Signed)
Emergency Department Provider Note  I have reviewed the triage vital signs and the nursing notes.  HISTORY  Chief Complaint Abdominal Pain   HPI Kirsten Choi is a 67 y.o. female with history of shingles and some type of flap reconstruction for breast cancer and mesh placement related the same presents the ER today secondary to abdominal pain.  Patient states that started just prior to arrival.  She did not try medicines at home as she did not want to mask it.  She states that she had pain something, like this when she had a shingles outbreak before and the rash was on her back but pain was in her stomach she states that time was on the right side.  She states the pain this time started in her left lower quadrant and then migrated to the right lower quadrant and subsequently hung up there.  Open nausea vomiting.  No diarrhea or constipation.  No urinary changes.  She does feel little bit bloated.  No fevers.  No recent illnesses or sick contacts.  No suspicious food intake.  PMH Past Medical History:  Diagnosis Date   Arthritis    Cancer ([place])    breast CA (resolved)   Hypertension    Migraine     Home Medications Prior to Admission medications   Medication Sig Start Date End Date Taking? Authorizing Provider  simethicone (GAS-X) 80 MG chewable tablet Chew 1 tablet (80 mg total) by mouth every 6 (six) hours as needed (abdominal pain and gas). 12/06/22  Yes Ares Cardozo, Corene Cornea, MD  albuterol (VENTOLIN HFA) 108 (90 Base) MCG/ACT inhaler Inhale 1-2 puffs into the lungs every 6 (six) hours as needed. 06/15/22   Parrett, Fonnie Mu, NP  amLODipine (NORVASC) 5 MG tablet Take 5 mg by mouth daily. 06/22/22   [provider]  aspirin EC 81 MG tablet Take 81 mg by mouth daily. Swallow whole.    [provider]  Cholecalciferol 25 MCG (1000 UT) tablet Take by mouth.    [provider]  ezetimibe (ZETIA) 10 MG tablet Take 1 tablet by mouth daily. 05/30/21   [provider]  Famotidine-Ca Carb-Mag Hydrox (PEPCID COMPLETE PO) Take 1 tablet by mouth daily.    [provider]  Lactobacillus (PROBIOTIC ACIDOPHILUS PO) Take 1 tablet by mouth daily.    [provider]  lisinopril (ZESTRIL) 40 MG tablet Take 40 mg by mouth daily. 07/14/21   [provider]  omeprazole (PRILOSEC) 40 MG capsule Take 40 mg by mouth 2 (two) times daily. 05/17/22   [provider]  psyllium (METAMUCIL) 58.6 % packet Take 1 packet by mouth daily.    [provider]    Social History Social History   Tobacco Use   Smoking status: Never   Smokeless tobacco: Never  Vaping Use   Vaping Use: Never used  Substance Use Topics   Alcohol use: Never   Drug use: Never    Review of Systems: Documented in HPI ____________________________________________  PHYSICAL EXAM: VITAL SIGNS: ED Triage Vitals  Enc Vitals Group     BP 12/05/22 2142 (!) 194/112     Pulse Rate 12/05/22 2142 97     Resp 12/05/22 2142 18     Temp 12/05/22 2142 97.6 F (36.4 C)     Temp Source 12/05/22 2142 Oral     SpO2 12/05/22 2142 97 %     Weight --      Height --  Head Circumference --      Peak Flow --      Pain Score 12/05/22 2140 8     Pain Loc --      Pain Edu? --      Excl. in [place]? --    Physical Exam Vitals and nursing note reviewed.  Constitutional:      Appearance: She is well-developed.  HENT:     Head: Normocephalic and atraumatic.  Cardiovascular:     Rate and Rhythm: Normal rate and regular rhythm.  Pulmonary:     Effort: No respiratory distress.     Breath sounds: No stridor.  Abdominal:     General: There is no distension.     Tenderness: There is generalized abdominal tenderness. There is no guarding or rebound. Negative signs include Murphy's sign and Rovsing's sign.  Musculoskeletal:     Cervical back: Normal range of motion.  Neurological:     Mental Status: She is alert.       ____________________________________________    LABS (all labs ordered are listed, but only abnormal results are displayed)  Labs Reviewed  LIPASE, BLOOD - Abnormal; Notable for the following components:      Result Value   Lipase 56 (*)    All other components within normal limits  COMPREHENSIVE METABOLIC PANEL - Abnormal; Notable for the following components:   Glucose, Bld 125 (*)    All other components within normal limits  URINALYSIS, ROUTINE W REFLEX MICROSCOPIC - Abnormal; Notable for the following components:   Hgb urine dipstick TRACE (*)    All other components within normal limits  URINALYSIS, MICROSCOPIC (REFLEX) - Abnormal; Notable for the following components:   Bacteria, UA RARE (*)    All other components within normal limits  CBC   ____________________________________________  EKG   EKG Interpretation  Date/Time:    Ventricular Rate:    PR Interval:    QRS Duration:   QT Interval:    QTC Calculation:   R Axis:     Text Interpretation:          ____________________________________________  RADIOLOGY  CT ABDOMEN PELVIS W CONTRAST  Result Date: 12/06/2022 CLINICAL DATA:  Abdominal pain, acute, nonlocalized. EXAM: CT ABDOMEN AND PELVIS WITH CONTRAST TECHNIQUE: Multidetector CT imaging of the abdomen and pelvis was performed using the standard protocol following bolus administration of intravenous contrast. RADIATION DOSE REDUCTION: This exam was performed according to the departmental dose-optimization program which includes automated exposure control, adjustment of the mA and/or kV according to patient size and/or use of iterative reconstruction technique. CONTRAST:  129m OMNIPAQUE IOHEXOL 300 MG/ML  SOLN COMPARISON:  04/22/2022. FINDINGS: Lower chest: No acute abnormality. Hepatobiliary: No focal liver abnormality is seen. Status post cholecystectomy. No biliary dilatation. Pancreas: Unremarkable. No pancreatic ductal dilatation or surrounding inflammatory changes. Spleen: Normal in size without focal  abnormality. Adrenals/Urinary Tract: The adrenal glands are within normal limits. The kidneys enhance symmetrically. A subcentimeter hypodensity is noted in the upper pole the left kidney which is too small to further characterize. No renal or ureteral calculus or obstructive uropathy bilaterally. The bladder is unremarkable. Stomach/Bowel: Stomach is within normal limits. Appendix appears normal. No evidence of bowel wall thickening, distention, or inflammatory changes. No free air or pneumatosis. A diverticulum is present along the third portion of the duodenum. A few scattered diverticula are present along the colon without evidence of diverticulitis. Vascular/Lymphatic: Aortic atherosclerosis. No enlarged abdominal or pelvic lymph nodes. Reproductive: Uterus and bilateral adnexa are unremarkable.  Other: No abdominopelvic ascites. Multifocal fat containing periumbilical hernias in the anterior abdominal wall in the midline. Musculoskeletal: Degenerative changes are present in the thoracolumbar spine. No acute osseous abnormality. IMPRESSION: 1. No acute intra-abdominal process. 2. Diverticulosis without diverticulitis. 3. Aortic atherosclerosis. Electronically Signed   By: Brett Fairy M.D.   On: 12/06/2022 01:45   ____________________________________________  PROCEDURES  Procedure(s) performed:   Procedures ____________________________________________  INITIAL IMPRESSION / ASSESSMENT AND PLAN   This patient presents to the ED for concern of abdominal pain, this involves an extensive number of treatment options, and is a complaint that carries with it a high risk of complications and morbidity.  The differential diagnosis includes appendicitis, hepatitis, pancreatitis, colitis, enteritis.  Additional history obtained:  Additional history obtained from no one Previous records obtained and reviewed normal CT abdomen in June with diverticulosis  Co morbidities that complicate the patient  evaluation  N/A  Social Determinants of Health:  N/A  ED Course  Images ordered viewed and obtained by myself. Agree with Radiology interpretation. Details in ED course.  Labs ordered reviewed by myself as detailed in ED course.  Consultations obtained/considered detailed in ED course.        Cardiac Monitoring:  The patient was maintained on a cardiac monitor.  I personally viewed and interpreted the cardiac monitored which showed an underlying rhythm of: sinus, rate 78  CRITICAL INTERVENTIONS:  Maalox/lidocaine Fentanyl Fluids antiemetics  Reevaluation:  After the interventions noted above, I reevaluated the patient and found that they have :improved  Simethicone seem to improve the patient's symptoms the most.  Could be gastritis versus just regular gas.  CT scan negative.  No rash to suggest that she has zoster.  Follow-up with PCP in couple days if not improving will return here for any new or worsening symptoms.  FINAL IMPRESSION AND PLAN Final diagnoses:  Right lower quadrant abdominal pain   A medical screening exam was performed and I feel the patient has had an appropriate workup for their chief complaint at this time and likelihood of emergent condition existing is low. They have been counseled on decision, DISCHARGE, follow up and which symptoms necessitate immediate return to the emergency department. They or their family verbally stated understanding and agreement with plan and discharged in stable condition.   ____________________________________________   NEW OUTPATIENT MEDICATIONS STARTED DURING THIS VISIT:  New Prescriptions   SIMETHICONE (GAS-X) 80 MG CHEWABLE TABLET    Chew 1 tablet (80 mg total) by mouth every 6 (six) hours as needed (abdominal pain and gas).    Note:  This note was prepared with assistance of Dragon voice recognition software. Occasional wrong-word or sound-a-like substitutions may have occurred due to the inherent limitations of  voice recognition software.    Brooklinn Longbottom, Corene Cornea, MD 12/06/22 [phone]

## 2022-12-06 NOTE — ED Notes (Signed)
Patient transported to CT via stretcher.

## 2022-12-20 ENCOUNTER — Telehealth: Payer: Self-pay | Admitting: Pulmonary Disease

## 2022-12-20 NOTE — Telephone Encounter (Signed)
Called PT from recall list. We set appt for CPAP compliance. Last time her Cpap did not transmit. She wants to avoid that this time. Dr. Jenetta Downer got info from her phone but not a print out. Machine Type: [id number]. pls call PT to advise how we can insure he can read it properly on upcoming visit.

## 2022-12-21 NOTE — Telephone Encounter (Signed)
Spoke with the pt and notified that we are able to pull her DL from [facility] pull once gets closer to her visit for the most updated information  Nothing further needed

## 2023-01-04 ENCOUNTER — Ambulatory Visit: Payer: Medicare Other | Admitting: Pulmonary Disease

## 2023-01-10 ENCOUNTER — Ambulatory Visit (INDEPENDENT_AMBULATORY_CARE_PROVIDER_SITE_OTHER): Payer: Medicare Other | Admitting: Pulmonary Disease

## 2023-01-10 ENCOUNTER — Encounter: Payer: Self-pay | Admitting: Pulmonary Disease

## 2023-01-10 VITALS — BP 150/92 | HR 90 | Ht 65.0 in | Wt 230.0 lb

## 2023-01-10 DIAGNOSIS — G4733 Obstructive sleep apnea (adult) (pediatric): Secondary | ICD-10-CM | POA: Diagnosis not present

## 2023-01-10 NOTE — Progress Notes (Signed)
Kirsten Choi    [id number]    1955/12/27  Primary Care Physician:Webster, Rushie Nyhan, DO  Referring Physician: Francesca Oman, DO No address on file  Chief complaint:   Follow-up for obstructive sleep apnea  HPI:  She has been following up for obstructive sleep apnea Has been compliant with CPAP therapy  Using CPAP nightly Waking up feeling like she is at a good nights rest  Wakes up with a dry mouth Has to have water at the bedside for the dryness Mask fits well otherwise  I did discuss about adjusting the humidification setting on the machine Another option would be having a humidifier in the room  Breathing has been relatively steady   She does have a history of moderate obstructive sleep apnea  Occasionally does have some burning in her chest, does have a history of reflux, H. pylori which was recently treated on GERD .  Seems to be getting adequate sleep at night and waking up feeling rested  Usually goes to bed about 10 PM, wakes up at 5 AM 5 minutes to fall asleep 3-4 awakenings  Weight has been stable since the last visit  She does have a history of dysrhythmia as Difficult to control blood pressure   Outpatient Encounter Medications as of 01/10/2023  Medication Sig   albuterol (VENTOLIN HFA) 108 (90 Base) MCG/ACT inhaler Inhale 1-2 puffs into the lungs every 6 (six) hours as needed.   amLODipine (NORVASC) 5 MG tablet Take 5 mg by mouth daily.   aspirin EC 81 MG tablet Take 81 mg by mouth daily. Swallow whole.   Cholecalciferol 25 MCG (1000 UT) tablet Take by mouth.   ezetimibe (ZETIA) 10 MG tablet Take 1 tablet by mouth daily.   lisinopril (ZESTRIL) 40 MG tablet Take 40 mg by mouth daily.   omeprazole (PRILOSEC) 40 MG capsule Take 40 mg by mouth 2 (two) times daily.   psyllium (METAMUCIL) 58.6 % packet Take 1 packet by mouth daily.   simethicone (GAS-X) 80 MG chewable tablet Chew 1 tablet (80 mg total) by mouth every 6 (six) hours as  needed (abdominal pain and gas).   Famotidine-Ca Carb-Mag Hydrox (PEPCID COMPLETE PO) Take 1 tablet by mouth daily. (Patient not taking: Reported on 01/10/2023)   Lactobacillus (PROBIOTIC ACIDOPHILUS PO) Take 1 tablet by mouth daily. (Patient not taking: Reported on 01/10/2023)   No facility-administered encounter medications on file as of 01/10/2023.    Allergies as of 01/10/2023 - Review Complete 01/10/2023  Allergen Reaction Noted   Nickel Dermatitis 10/19/2016   Statins Other (See Comments) 05/10/2021   Tape  02/14/2019   Zoloft [sertraline]  09/04/2020    Past Medical History:  Diagnosis Date   Arthritis    Cancer ([place])    breast CA (resolved)   Hypertension    Migraine     Past Surgical History:  Procedure Laterality Date   BREAST SURGERY     COSMETIC SURGERY     HERNIA REPAIR     REPLACEMENT TOTAL KNEE     WRIST SURGERY      Family History  Problem Relation Age of Onset   Leukemia Mother    Stroke Father    CAD Father    Cancer Father     Social History   Socioeconomic History   Marital status: Widowed    Spouse name: Not on file   Number of children: Not on file   Years of education:  Not on file   Highest education level: Not on file  Occupational History   Not on file  Tobacco Use   Smoking status: Never   Smokeless tobacco: Never  Vaping Use   Vaping Use: Never used  Substance and Sexual Activity   Alcohol use: Never   Drug use: Never   Sexual activity: Not on file  Other Topics Concern   Not on file  Social History Narrative   Not on file   Social Determinants of Health   Financial Resource Strain: Not on file  Food Insecurity: Not on file  Transportation Needs: Not on file  Physical Activity: Not on file  Stress: Not on file  Social Connections: Not on file  Intimate Partner Violence: Not on file    Review of Systems  Respiratory:  Positive for apnea.   Psychiatric/Behavioral:  Positive for sleep disturbance.     Vitals:    01/10/23 0849  BP: (!) 150/92  Pulse: 90  SpO2: 99%     Physical Exam Constitutional:      Appearance: She is obese.  HENT:     Head: Normocephalic.     Mouth/Throat:     Mouth: Mucous membranes are moist.     Comments: Mallampati 3 Eyes:     General: No scleral icterus.    Pupils: Pupils are equal, round, and reactive to light.  Cardiovascular:     Rate and Rhythm: Normal rate and regular rhythm.     Heart sounds: No murmur heard.    No friction rub.  Pulmonary:     Effort: No respiratory distress.     Breath sounds: No stridor. No wheezing or rhonchi.  Musculoskeletal:     Cervical back: No rigidity or tenderness.  Neurological:     Mental Status: She is alert.  Psychiatric:        Mood and Affect: Mood normal.    Data Reviewed: Patient's AHI is 15.1 on a sleep study  Compliant and reviewed on patient's cell phone, nightly use, average AHI less than 1.8 Does have some leaks  PFT with no obstruction, no restriction, normal diffusing capacity   Assessment:  Moderate obstructive sleep apnea -Tolerating CPAP well -Encouraged to continue using CPAP  History of cardiac dysrhythmia -Has been stable  Difficult to control hypertension -Optimizing treatment  Shortness of breath is better -Has not needed albuterol recently  Plan/Recommendations:  Continue CPAP nightly  Call with any significant concerns  Albuterol use as needed  Regular exercises encouraged  Follow-up in 6 months    Sherrilyn Rist MD [facility] 01/10/2023, 8:53 AM  CC: Francesca Oman, DO

## 2023-01-10 NOTE — Patient Instructions (Signed)
I will see you in 6 months  Continue using your CPAP nightly  Continue graded exercise as tolerated  Call us with any significant concerns

## 2023-03-23 ENCOUNTER — Ambulatory Visit: Payer: Medicare Other | Admitting: Student

## 2023-05-09 ENCOUNTER — Telehealth: Payer: Self-pay | Admitting: Pulmonary Disease

## 2023-05-09 DIAGNOSIS — G4733 Obstructive sleep apnea (adult) (pediatric): Secondary | ICD-10-CM

## 2023-05-09 NOTE — Telephone Encounter (Signed)
PT would like a RX for a new mask from [facility]. Please call tio advise action taken. Thanks. Her # is [phone]

## 2023-05-11 NOTE — Telephone Encounter (Signed)
Okay to place order for patient to get new cpap mask?

## 2023-05-14 NOTE — Telephone Encounter (Signed)
Yes  Okay to place order for new mask

## 2023-05-15 NOTE — Telephone Encounter (Signed)
ATC x1.  LVM. 

## 2023-05-16 NOTE — Telephone Encounter (Signed)
Called and spoke with pt, order has been sent to [facility] for new cpap supplies

## 2023-07-31 ENCOUNTER — Ambulatory Visit: Payer: Medicare Other | Admitting: Pulmonary Disease

## 2023-08-13 ENCOUNTER — Encounter (HOSPITAL_BASED_OUTPATIENT_CLINIC_OR_DEPARTMENT_OTHER): Payer: Self-pay | Admitting: Emergency Medicine

## 2023-08-13 ENCOUNTER — Emergency Department (HOSPITAL_BASED_OUTPATIENT_CLINIC_OR_DEPARTMENT_OTHER)
Admission: EM | Admit: 2023-08-13 | Discharge: 2023-08-13 | Disposition: A | Discharge status: Home / Self Care | Payer: Medicare Other | Attending: Emergency Medicine | Admitting: Emergency Medicine

## 2023-08-13 ENCOUNTER — Other Ambulatory Visit: Payer: Self-pay

## 2023-08-13 DIAGNOSIS — Z853 Personal history of malignant neoplasm of breast: Secondary | ICD-10-CM | POA: Insufficient documentation

## 2023-08-13 DIAGNOSIS — Z79899 Other long term (current) drug therapy: Secondary | ICD-10-CM | POA: Diagnosis not present

## 2023-08-13 DIAGNOSIS — R55 Syncope and collapse: Secondary | ICD-10-CM | POA: Diagnosis present

## 2023-08-13 DIAGNOSIS — I10 Essential (primary) hypertension: Secondary | ICD-10-CM | POA: Diagnosis not present

## 2023-08-13 DIAGNOSIS — Z7982 Long term (current) use of aspirin: Secondary | ICD-10-CM | POA: Insufficient documentation

## 2023-08-13 DIAGNOSIS — R001 Bradycardia, unspecified: Secondary | ICD-10-CM

## 2023-08-13 LAB — BASIC METABOLIC PANEL
Anion gap: 13 (ref 5–15)
BUN: 23 mg/dL (ref 8–23)
CO2: 22 mmol/L (ref 22–32)
Calcium: 9.1 mg/dL (ref 8.9–10.3)
Chloride: 100 mmol/L (ref 98–111)
Creatinine, Ser: 0.73 mg/dL (ref 0.44–1.00)
GFR, Estimated: >60 mL/min (ref >60)
Glucose, Bld: 90 mg/dL (ref 70–99)
Potassium: 3.8 mmol/L (ref 3.5–5.1)
Sodium: 135 mmol/L (ref 135–145)

## 2023-08-13 LAB — CBC WITH DIFFERENTIAL/PLATELET
Abs Immature Granulocytes: 0.14 10*3/uL — ABNORMAL HIGH (ref 0.00–0.07)
Basophils Absolute: 0 10*3/uL (ref 0.0–0.1)
Basophils Relative: 1 %
Eosinophils Absolute: 0 10*3/uL (ref 0.0–0.5)
Eosinophils Relative: 0 %
HCT: 44.1 % (ref 36.0–46.0)
Hemoglobin: 14.4 g/dL (ref 12.0–15.0)
Immature Granulocytes: 2 %
Lymphocytes Relative: 25 %
Lymphs Abs: 2 10*3/uL (ref 0.7–4.0)
MCH: 27.9 pg (ref 26.0–34.0)
MCHC: 32.7 g/dL (ref 30.0–36.0)
MCV: 85.3 fL (ref 80.0–100.0)
Monocytes Absolute: 0.8 10*3/uL (ref 0.1–1.0)
Monocytes Relative: 10 %
Neutro Abs: 5 10*3/uL (ref 1.7–7.7)
Neutrophils Relative %: 62 %
Platelets: 275 10*3/uL (ref 150–400)
RBC: 5.17 MIL/uL — ABNORMAL HIGH (ref 3.87–5.11)
RDW: 12.7 % (ref 11.5–15.5)
WBC: 8 10*3/uL (ref 4.0–10.5)
nRBC: 0 % (ref 0.0–0.2)

## 2023-08-13 LAB — TROPONIN I (HIGH SENSITIVITY): Troponin I (High Sensitivity): 6 ng/L (ref <18)

## 2023-08-13 NOTE — ED Triage Notes (Signed)
Pt reports near syncopal episodes at home. During the episode she checked her pulse ox at home and saw a HR in the 30s-40s. She says she brought her HR up by getting up and walking around her house but states this recurs after she sits back down. She reports this has happened every 10 minutes "especially if I'm trying to lay down to go to sleep". Pt has been on a coarse of prednisone for flu and is worried this may be causing it. Last prednisone taken 1000 Sunday (yesterday). She denies any hx of same.

## 2023-08-13 NOTE — ED Provider Notes (Signed)
[place] [facility] [facility] Provider Note   CSN: [id number] Arrival date & time: 08/13/23  0208     History  Chief Complaint  Patient presents with   Near Syncope    Kirsten Choi is a 67 y.o. female.  The history is provided by the patient.  Illness Location:  At home Quality:  Was concerned because she checked her pulse on a pulse oximeter and it was low in the 40s.  She had been asleep but was laying in bed and awake at that time. Severity:  Moderate Onset quality:  Sudden Timing:  Constant Progression:  Improving Chronicity:  Recurrent Context:  Patient has a history of bradycardia Relieved by:  Moving around the house Worsened by:  Nothing Ineffective treatments:  None Associated symptoms: cough   Associated symptoms: no chest pain, no fever, no rash, no rhinorrhea, no shortness of breath and no vomiting   Associated symptoms comment:  Had the flu last week and took steroids and tamiflu and is concerned that this may be related.   Risk factors:  History of bradycardia     Past Medical History:  Diagnosis Date   Arthritis    Cancer (HCC)    breast CA (resolved)   Hypertension    Migraine      Home Medications Prior to Admission medications   Medication Sig Start Date End Date Taking? Authorizing Provider  albuterol (VENTOLIN HFA) 108 (90 Base) MCG/ACT inhaler Inhale 1-2 puffs into the lungs every 6 (six) hours as needed. 06/15/22   Parrett, Virgel Bouquet, NP  amLODipine (NORVASC) 5 MG tablet Take 5 mg by mouth daily. 06/22/22   [provider]  aspirin EC 81 MG tablet Take 81 mg by mouth daily. Swallow whole.    [provider]  Cholecalciferol 25 MCG (1000 UT) tablet Take by mouth.    [provider]  ezetimibe (ZETIA) 10 MG tablet Take 1 tablet by mouth daily. 05/30/21   [provider]  Famotidine-Ca Carb-Mag Hydrox (PEPCID COMPLETE PO) Take 1 tablet by mouth daily. Patient not taking: Reported on  01/10/2023    [provider]  Lactobacillus (PROBIOTIC ACIDOPHILUS PO) Take 1 tablet by mouth daily. Patient not taking: Reported on 01/10/2023    [provider]  lisinopril (ZESTRIL) 40 MG tablet Take 40 mg by mouth daily. 07/14/21   [provider]  omeprazole (PRILOSEC) 40 MG capsule Take 40 mg by mouth 2 (two) times daily. 05/17/22   [provider]  psyllium (METAMUCIL) 58.6 % packet Take 1 packet by mouth daily.    [provider]  simethicone (GAS-X) 80 MG chewable tablet Chew 1 tablet (80 mg total) by mouth every 6 (six) hours as needed (abdominal pain and gas). 12/06/22   Mesner, Barbara Cower, MD      Allergies    Nickel, Statins, Tape, and Zoloft [sertraline]    Review of Systems   Review of Systems  Constitutional:  Negative for fever.  HENT:  Negative for rhinorrhea.   Respiratory:  Positive for cough. Negative for shortness of breath.   Cardiovascular:  Negative for chest pain and palpitations.  Gastrointestinal:  Negative for vomiting.  Skin:  Negative for rash.  Neurological:  Negative for syncope and weakness.    Physical Exam Updated Vital Signs BP (!) 187/108 (BP Location: Right Arm)   Pulse 66   Temp 98.3 F (36.8 C)   Resp 20   Ht 5\' 4"  (1.626 m)   Wt  99.8 kg   SpO2 97%   BMI 37.76 kg/m  Physical Exam Vitals and nursing note reviewed. Exam conducted with a chaperone present.  Constitutional:      General: She is not in acute distress.    Appearance: Normal appearance. She is well-developed.  HENT:     Head: Normocephalic and atraumatic.     Nose: Nose normal.  Eyes:     Extraocular Movements: Extraocular movements intact.     Pupils: Pupils are equal, round, and reactive to light.  Cardiovascular:     Rate and Rhythm: Normal rate and regular rhythm.     Pulses: Normal pulses.     Heart sounds: Normal heart sounds.     Comments: Pulse 68 during my exam Pulmonary:     Effort: No respiratory distress.     Breath  sounds: Normal breath sounds. No stridor. No wheezing, rhonchi or rales.  Abdominal:     General: Bowel sounds are normal. There is no distension.     Palpations: Abdomen is soft.     Tenderness: There is no abdominal tenderness. There is no guarding or rebound.  Genitourinary:    Vagina: No vaginal discharge.  Musculoskeletal:        General: Normal range of motion.     Cervical back: Normal range of motion and neck supple.  Skin:    General: Skin is warm and dry.     Capillary Refill: Capillary refill takes less than 2 seconds.     Findings: No erythema or rash.  Neurological:     General: No focal deficit present.     Mental Status: She is alert and oriented to person, place, and time.     Deep Tendon Reflexes: Reflexes normal.     ED Results / Procedures / Treatments   Labs (all labs ordered are listed, but only abnormal results are displayed) Results for orders placed or performed during the hospital encounter of 08/13/23  CBC with Differential  Result Value Ref Range   WBC 8.0 4.0 - 10.5 K/uL   RBC 5.17 (H) 3.87 - 5.11 MIL/uL   Hemoglobin 14.4 12.0 - 15.0 g/dL   HCT 65.7 84.6 - 96.2 %   MCV 85.3 80.0 - 100.0 fL   MCH 27.9 26.0 - 34.0 pg   MCHC 32.7 30.0 - 36.0 g/dL   RDW 95.2 84.1 - 32.4 %   Platelets 275 150 - 400 K/uL   nRBC 0.0 0.0 - 0.2 %   Neutrophils Relative % 62 %   Neutro Abs 5.0 1.7 - 7.7 K/uL   Lymphocytes Relative 25 %   Lymphs Abs 2.0 0.7 - 4.0 K/uL   Monocytes Relative 10 %   Monocytes Absolute 0.8 0.1 - 1.0 K/uL   Eosinophils Relative 0 %   Eosinophils Absolute 0.0 0.0 - 0.5 K/uL   Basophils Relative 1 %   Basophils Absolute 0.0 0.0 - 0.1 K/uL   Immature Granulocytes 2 %   Abs Immature Granulocytes 0.14 (H) 0.00 - 0.07 K/uL  Basic metabolic panel  Result Value Ref Range   Sodium 135 135 - 145 mmol/L   Potassium 3.8 3.5 - 5.1 mmol/L   Chloride 100 98 - 111 mmol/L   CO2 22 22 - 32 mmol/L   Glucose, Bld 90 70 - 99 mg/dL   BUN 23 8 - 23 mg/dL    Creatinine, Ser 4.01 0.44 - 1.00 mg/dL   Calcium 9.1 8.9 - 02.7 mg/dL   GFR, Estimated >25 >36  mL/min   Anion gap 13 5 - 15  Troponin I (High Sensitivity)  Result Value Ref Range   Troponin I (High Sensitivity) 6 <18 ng/L   No results found.   EKG EKG Interpretation Date/Time:  Monday August 13 2023 03:06:50 EDT Ventricular Rate:  52 PR Interval:  133 QRS Duration:  95 QT Interval:  423 QTC Calculation: 394 R Axis:   11  Text Interpretation: Sinus rhythm Confirmed by Nicanor Alcon, Saisha Hogue ([id number]) on 08/13/2023 3:58:23 AM  Radiology No results found.  Procedures Procedures    Medications Ordered in ED Medications - No data to display  ED Course/ Medical Decision Making/ A&P                                 Medical Decision Making Patient with a h/o bradycardia who had flu last week and was concerned this evening about her pulse being low   Amount and/or Complexity of Data Reviewed External Data Reviewed: notes.    Details: Previous notes reviewed  Labs: ordered.    Details: Troponin is negative 6.  Normal white count 8, normal hemoglobin 14.4, normal platelet count.  Normal sodium 135, normal potassium 3.8, normal creatinine 0.73 ECG/medicine tests: ordered and independent interpretation performed. Decision-making details documented in ED Course.  Risk Risk Details: Patient has a history of bradycardia but this happened in the middle of night while patient was at rest and this may be where she lives when she is asleep.  The HR responded to activity.  There are no arrhythmias on EKG.  No nodal blocks.  Patient is perfusing well.  Patient is also concerned that she is wheezing.  She is not wheezing on my exam and lungs are clear with normal oxygen saturation.  She wants to know about a nebulizer.  I have advised patient to discuss this with her PMD.  Stable for discharge with close follow up of both her PMD and cardiology as an outpatient.  Strict return     Final  Clinical Impression(s) / ED Diagnoses Final diagnoses:  Primary hypertension  Bradycardia    I have reviewed the triage vital signs and the nursing notes. Pertinent labs & imaging results that were available during my care of the patient were reviewed by me and considered in my medical decision making (see chart for details). After history, exam, and medical workup I feel the patient has been appropriately medically screened and is safe for discharge home. Pertinent diagnoses were discussed with the patient. Patient was given return precautions.    Rx / DC Orders ED Discharge Orders     None         Nyla Creason, MD 08/13/23 6045

## 2023-08-29 ENCOUNTER — Ambulatory Visit: Payer: Medicare Other | Admitting: Pulmonary Disease

## 2023-09-12 ENCOUNTER — Encounter: Payer: Self-pay | Admitting: Pulmonary Disease

## 2023-09-12 ENCOUNTER — Ambulatory Visit: Payer: Medicare Other | Admitting: Pulmonary Disease

## 2023-09-12 VITALS — BP 173/117 | HR 96 | Ht 65.0 in | Wt 236.8 lb

## 2023-09-12 DIAGNOSIS — G4733 Obstructive sleep apnea (adult) (pediatric): Secondary | ICD-10-CM | POA: Diagnosis not present

## 2023-09-12 MED ORDER — ALBUTEROL SULFATE HFA 108 (90 BASE) MCG/ACT IN AERS: 2.0000 | INHALATION_SPRAY | Freq: Four times a day (QID) | RESPIRATORY_TRACT | 6 refills | Status: DC | PRN

## 2023-09-12 NOTE — Progress Notes (Signed)
Kirsten Choi    [id number]    1956-10-19  Primary Care Physician:Udom, Victorino Dike, MD  Referring Physician: Henri Medal, MD [address] [place],  [place] [address]  Chief complaint:   Follow-up for obstructive sleep apnea  HPI:  Continues to be compliant with sleep apnea treatment  Uses a CPAP nightly  Continues to benefit from CPAP use  Waking up feeling like she is rested well History of moderate obstructive sleep apnea  Seems to be getting adequate sleep at night and waking up feeling rested  Usually goes to bed about 10 PM, wakes up at 5 AM 5 minutes to fall asleep 3-4 awakenings  Weight has been stable since the last visit  She does have a history of dysrhythmia as Difficult to control blood pressure   Outpatient Encounter Medications as of 09/12/2023  Medication Sig   albuterol (VENTOLIN HFA) 108 (90 Base) MCG/ACT inhaler Inhale 1-2 puffs into the lungs every 6 (six) hours as needed.   amLODipine (NORVASC) 5 MG tablet Take 5 mg by mouth daily.   aspirin EC 81 MG tablet Take 81 mg by mouth daily. Swallow whole.   Cholecalciferol 25 MCG (1000 UT) tablet Take by mouth.   ezetimibe (ZETIA) 10 MG tablet Take 1 tablet by mouth daily.   Famotidine-Ca Carb-Mag Hydrox (PEPCID COMPLETE PO) Take 1 tablet by mouth daily. (Patient not taking: Reported on 01/10/2023)   Lactobacillus (PROBIOTIC ACIDOPHILUS PO) Take 1 tablet by mouth daily. (Patient not taking: Reported on 01/10/2023)   lisinopril (ZESTRIL) 40 MG tablet Take 40 mg by mouth daily.   omeprazole (PRILOSEC) 40 MG capsule Take 40 mg by mouth 2 (two) times daily.   psyllium (METAMUCIL) 58.6 % packet Take 1 packet by mouth daily.   simethicone (GAS-X) 80 MG chewable tablet Chew 1 tablet (80 mg total) by mouth every 6 (six) hours as needed (abdominal pain and gas).   No facility-administered encounter medications on file as of 09/12/2023.    Allergies as of 09/12/2023 - Review Complete  08/13/2023  Allergen Reaction Noted   Nickel Dermatitis 10/19/2016   Statins Other (See Comments) 05/10/2021   Tape  02/14/2019   Zoloft [sertraline]  09/04/2020    Past Medical History:  Diagnosis Date   Arthritis    Cancer (HCC)    breast CA (resolved)   Hypertension    Migraine     Past Surgical History:  Procedure Laterality Date   BREAST SURGERY     COSMETIC SURGERY     HERNIA REPAIR     REPLACEMENT TOTAL KNEE     WRIST SURGERY      Family History  Problem Relation Age of Onset   Leukemia Mother    Stroke Father    CAD Father    Cancer Father     Social History   Socioeconomic History   Marital status: Widowed    Spouse name: Not on file   Number of children: Not on file   Years of education: Not on file   Highest education level: Not on file  Occupational History   Not on file  Tobacco Use   Smoking status: Never   Smokeless tobacco: Never  Vaping Use   Vaping status: Never Used  Substance and Sexual Activity   Alcohol use: Never   Drug use: Never   Sexual activity: Not on file  Other Topics Concern   Not on file  Social History Narrative  Not on file   Social Determinants of Health   Financial Resource Strain: Unknown (08/15/2022)   Received from [facility]   Overall Financial Resource Strain (CARDIA)  Food Insecurity: Low Risk  (08/23/2023)   Received from [facility]   Hunger Vital Sign    Worried About Running Out of Food in the Last Year: Never true    Ran Out of Food in the Last Year: Never true  Transportation Needs: No Transportation Needs (08/23/2023)   Received from [facility]    In the past 12 months, has lack of reliable transportation kept you from medical appointments, meetings, work or from getting things needed for daily living? : No  Physical Activity: Sufficiently Active (08/15/2022)   Received from [facility]   Exercise Vital Sign  Stress: No Stress Concern Present (08/15/2022)   Received  from [facility]   [employer] of Occupational Health - Occupational Stress Questionnaire  Social Connections: Unknown (08/15/2022)   Received from [facility] visits prior to 01/06/2023., [facility] visits prior to 01/06/2023.   Social Advertising account executive [NHANES]    Frequency of Communication with Friends and Family: More than three times a week    Frequency of Social Gatherings with Friends and Family: More than three times a week    Attends Religious Services: More than 4 times per year    Active Member of Clubs or Organizations: Yes    Attends Banker Meetings: More than 4 times per year    Marital Status: Patient refused  Intimate Partner Violence: Unknown (08/15/2022)   Received from [facility] visits prior to 01/06/2023., [facility] visits prior to 01/06/2023.   Humiliation, Afraid, Rape, and Kick questionnaire    Fear of Current or Ex-Partner: Patient refused    Emotionally Abused: Patient refused    Physically Abused: Patient refused    Sexually Abused: Patient refused    Review of Systems  Respiratory:  Positive for apnea.   Psychiatric/Behavioral:  Positive for sleep disturbance.     There were no vitals filed for this visit.    Physical Exam Constitutional:      Appearance: She is obese.  HENT:     Head: Normocephalic.     Mouth/Throat:     Mouth: Mucous membranes are moist.     Comments: Mallampati 3 Eyes:     General: No scleral icterus.    Pupils: Pupils are equal, round, and reactive to light.  Cardiovascular:     Rate and Rhythm: Normal rate and regular rhythm.     Heart sounds: No murmur heard.    No friction rub.  Pulmonary:     Effort: No respiratory distress.     Breath sounds: No stridor. No wheezing or rhonchi.  Musculoskeletal:     Cervical back: No rigidity or tenderness.  Neurological:     Mental Status: She is alert.   Psychiatric:        Mood and Affect: Mood normal.    Data Reviewed: Patient's AHI is 15.1 on a sleep study  Compliance study reviewed showing 100% compliance 95 percentile pressure of 9.3 Residual AHI of 0.1  Assessment:  Moderate obstructive sleep apnea -Tolerating CPAP well -Benefiting from CPAP  Hypertension -Continue CPAP therapy  Shortness of breath -Albuterol as needed   Plan/Recommendations:  Continue CPAP nightly  Use albuterol as needed Regular exercise as tolerated  Follow-up in  6 months    Virl Diamond MD [facility] 09/12/2023, 3:44 PM  CC: Henri Medal, MD

## 2023-09-12 NOTE — Patient Instructions (Signed)
Continue using your CPAP nightly  Prescription for travel CPAP auto CPAP 5-20 will be provided  Prescription for albuterol sent to pharmacy for you  I will see you back in 6 months  Call us with significant concerns

## 2023-10-17 ENCOUNTER — Other Ambulatory Visit: Payer: Self-pay

## 2023-10-17 ENCOUNTER — Emergency Department (HOSPITAL_BASED_OUTPATIENT_CLINIC_OR_DEPARTMENT_OTHER): Payer: Medicare Other

## 2023-10-17 ENCOUNTER — Encounter (HOSPITAL_BASED_OUTPATIENT_CLINIC_OR_DEPARTMENT_OTHER): Payer: Self-pay | Admitting: Urology

## 2023-10-17 ENCOUNTER — Emergency Department (HOSPITAL_BASED_OUTPATIENT_CLINIC_OR_DEPARTMENT_OTHER)
Admission: EM | Admit: 2023-10-17 | Discharge: 2023-10-17 | Disposition: A | Discharge status: Home / Self Care | Payer: Medicare Other | Attending: Emergency Medicine | Admitting: Emergency Medicine

## 2023-10-17 DIAGNOSIS — Z79899 Other long term (current) drug therapy: Secondary | ICD-10-CM | POA: Insufficient documentation

## 2023-10-17 DIAGNOSIS — R09A2 Foreign body sensation, throat: Secondary | ICD-10-CM

## 2023-10-17 DIAGNOSIS — Z7982 Long term (current) use of aspirin: Secondary | ICD-10-CM | POA: Insufficient documentation

## 2023-10-17 DIAGNOSIS — I10 Essential (primary) hypertension: Secondary | ICD-10-CM | POA: Diagnosis not present

## 2023-10-17 DIAGNOSIS — Z853 Personal history of malignant neoplasm of breast: Secondary | ICD-10-CM | POA: Insufficient documentation

## 2023-10-17 NOTE — ED Provider Notes (Signed)
[place] [facility] Provider Note   CSN: [id number] Arrival date & time: 10/17/23  0849     History  Chief Complaint  Patient presents with   Med Stuck in Throat     Kirsten Choi is a 67 y.o. female with PMH as listed below who presents with burning throat sensation and sensation that she has a pill stuck in her throat.  Patient took 4 little pills this morning, no new meds, no large pills. Pills and nothing usual, included her amlodipine, aspirin, lisinopril, Prilosec.  Thinks she may have swallowed rotting and initially choked on the pills somewhat.  Swallowed all of them and then had a burning sensation in the right side of her throat like maybe a pill was stuck.  Has drank a lot of water to try to help it go down but it has not improved.  Does not have any pain in her throat or chest.  No shortness of breath or coughing since that time.  Has no trouble swallowing secretions, denies any hematemesis.  No nausea and vomiting.  Did try to induce vomiting herself to see if that would help, but could not induce the vomiting.  Has had no trouble with this before, no problems with her esophagus in the past.   Past Medical History:  Diagnosis Date   Arthritis    Cancer (HCC)    breast CA (resolved)   Hypertension    Migraine        Home Medications Prior to Admission medications   Medication Sig Start Date End Date Taking? Authorizing Provider  albuterol (VENTOLIN HFA) 108 (90 Base) MCG/ACT inhaler Inhale 1-2 puffs into the lungs every 6 (six) hours as needed. 06/15/22   Parrett, Virgel Bouquet, NP  albuterol (VENTOLIN HFA) 108 (90 Base) MCG/ACT inhaler Inhale 2 puffs into the lungs every 6 (six) hours as needed for wheezing or shortness of breath. 09/12/23   Olalere, Onnie Boer A, MD  amLODipine (NORVASC) 5 MG tablet Take 5 mg by mouth daily. 06/22/22   [provider]  aspirin EC 81 MG tablet Take 81 mg by mouth daily. Swallow whole.    [provider]  Cholecalciferol 25 MCG (1000 UT) tablet Take by mouth.    [provider]  ezetimibe (ZETIA) 10 MG tablet Take 1 tablet by mouth daily. 05/30/21   [provider]  Famotidine-Ca Carb-Mag Hydrox (PEPCID COMPLETE PO) Take 1 tablet by mouth daily. Patient not taking: Reported on 01/10/2023    [provider]  Lactobacillus (PROBIOTIC ACIDOPHILUS PO) Take 1 tablet by mouth daily. Patient not taking: Reported on 01/10/2023    [provider]  lisinopril (ZESTRIL) 40 MG tablet Take 40 mg by mouth daily. 07/14/21   [provider]  omeprazole (PRILOSEC) 40 MG capsule Take 40 mg by mouth 2 (two) times daily. 05/17/22   [provider]  psyllium (METAMUCIL) 58.6 % packet Take 1 packet by mouth daily.    [provider]  simethicone (GAS-X) 80 MG chewable tablet Chew 1 tablet (80 mg total) by mouth every 6 (six) hours as needed (abdominal pain and gas). 12/06/22   Mesner, Barbara Cower, MD      Allergies    Nickel, Statins, Tape, and Zoloft [sertraline]    Review of Systems   Review of Systems A 10 point review of systems was performed and is negative unless otherwise reported in HPI.  Physical Exam Updated Vital Signs BP (!) 229/114 (BP Location:  Left Arm)   Pulse 96   Temp 98 F (36.7 C)   Resp 18   Ht 5\' 5"  (1.651 m)   Wt 107.4 kg   SpO2 95%   BMI 39.40 kg/m  Physical Exam General: Normal appearing female, lying in bed.  HEENT: PERRLA, Sclera anicteric, MMM, trachea midline. Clear oropharynx. No stridor.  Cardiology: RRR, no murmurs/rubs/gallops. BL radial and DP pulses equal bilaterally.  Resp: Normal respiratory rate and effort. CTAB, no wheezes, rhonchi, crackles.  Abd: Soft, non-tender, non-distended. No rebound tenderness or guarding.  GU: Deferred. MSK: No peripheral edema or signs of trauma.  Skin: warm, dry Neuro: A&Ox4, CNs II-XII grossly intact. MAEs.  Psych: Normal mood and affect.   ED Results / Procedures  / Treatments   Labs (all labs ordered are listed, but only abnormal results are displayed) Labs Reviewed - No data to display  EKG None  Radiology No results found.  Procedures Procedures    Medications Ordered in ED Medications - No data to display  ED Course/ Medical Decision Making/ A&P                          Medical Decision Making Amount and/or Complexity of Data Reviewed Radiology: ordered. Decision-making details documented in ED Course.    This patient presents to the ED for concern of globus sensation, this involves an extensive number of treatment options, and is a complaint that carries with it a high risk of complications and morbidity.  I considered the following differential and admission for this acute, potentially life threatening condition. Patient is very well appearing, no stridor, no resp distress.   MDM:    Patient presents with likely esophageal irritation and subsequent globus sensation and burning sensation in her throat after mild choking episode while taking four small pills this AM. No signs of esophageal obstruction, as patient is calm, comfortable, with intact swallowing of her secretions. No pain with swallowing, just a burning globus sensation, no chest pain, very low c/f esophageal perforation. She has no stridor, no resp distress, no c/f airway foreign body.  Consider pill esophagitis, She does report a minor choking episode immediately as she took the pill but she has no SOB, CP, abnormal lung sounds, hypoxia, or tachypnea. CXR doesn't show any abnormal findings including signs of aspiration or lobe collapse, and no air in mediastinum.    Blood pressure was elevated on arrival to >200 systolic. Patient does not have any symptoms on history nor signs on physical exam concerning for end organ damage secondary to hypertension.   Specifically, based up the patient's presentation, the patient is at sufficiently low risk for: -ACS given no CP, no SOB,  normal cardio-pulmonary exam -SAH/stroke given no hx of acute headache/stroke like sxs and normal neurologic exam.  -end organ renal disease given no hematuria Patient is given nothing for their blood pressure and it improves into 180s/80. She is asymptomatic from that perspective and patient was counseled regarding the deleterious effects of hypertension and the necessity of follow up with the patient's primary physician to establish a care plan.   Clinical Course as of 10/22/23 2149  Wed Oct 17, 2023  1111 DG Chest Portable 1 View No active disease. [HN]  1111 BP(!): 189/83 BP decreased without treatment. Denies CP, stroke-like sxs.  [HN]    Clinical Course User Index [HN] Loetta Rough, MD    Additional history obtained from chart review.   Reevaluation: After  the interventions noted above, I reevaluated the patient and found that they have :improved  Social Determinants of Health: Lives independently  Disposition: Patient with globus sensation. She is overall well appearing. No foreign body on xray. She is controlling secretions. Likely no esophageal impaction and she is instructed to closely monitor her symptoms and follow up outpatient for globus sensation and for HTN. DC w/ discharge instructions/return precautions. All questions answered to patient's satisfaction.    Co morbidities that complicate the patient evaluation  Past Medical History:  Diagnosis Date   Arthritis    Cancer (HCC)    breast CA (resolved)   Hypertension    Migraine      Medicines No orders of the defined types were placed in this encounter.   I have reviewed the patients home medicines and have made adjustments as needed  Problem List / ED Course: Problem List Items Addressed This Visit   None Visit Diagnoses       Globus sensation    -  Primary                   This note was created using dictation software, which may contain spelling or grammatical errors.    Loetta Rough, MD 10/22/23 [phone]

## 2023-10-17 NOTE — ED Triage Notes (Incomplete)
Pt states was swallowing her pills this am and feels like one of then is lodged in her throat Able to swallow with no difficulty just feels like throat is burning   BP elevated, pt states normal when anxious

## 2023-10-17 NOTE — Discharge Instructions (Signed)
Thank you for coming to [facility].  You were seen for concern for a pill stuck in your throat.  This is called a globus sensation and was likely caused by esophageal irritation.  Your chest x-ray was normal.   Please take your pills 1 at a time and swallowed them carefully with intent.  Please follow up with your primary care provider within 1 week.   Do not hesitate to return to the ED or call 911 if you experience: -Worsening symptoms -Difficulty swallowing your own saliva -Nausea vomiting so severe you cannot eat or drink anything -Chest pain, shortness of breath -Vomiting blood -Lightheadedness, passing out -Fevers/chills -Anything else that concerns you

## 2023-11-21 ENCOUNTER — Ambulatory Visit: Payer: Medicare Other | Admitting: Pulmonary Disease

## 2024-01-10 ENCOUNTER — Encounter: Payer: Self-pay | Admitting: Family Medicine

## 2024-01-10 ENCOUNTER — Ambulatory Visit: Payer: Medicare Other | Admitting: Family Medicine

## 2024-01-10 VITALS — BP 136/82 | HR 77 | Temp 97.5°F | Wt 237.6 lb

## 2024-01-10 DIAGNOSIS — M4722 Other spondylosis with radiculopathy, cervical region: Secondary | ICD-10-CM

## 2024-01-10 DIAGNOSIS — Z6839 Body mass index (BMI) 39.0-39.9, adult: Secondary | ICD-10-CM

## 2024-01-10 DIAGNOSIS — Z853 Personal history of malignant neoplasm of breast: Secondary | ICD-10-CM

## 2024-01-10 DIAGNOSIS — G4733 Obstructive sleep apnea (adult) (pediatric): Secondary | ICD-10-CM

## 2024-01-10 DIAGNOSIS — Z8619 Personal history of other infectious and parasitic diseases: Secondary | ICD-10-CM | POA: Insufficient documentation

## 2024-01-10 DIAGNOSIS — D6861 Antiphospholipid syndrome: Secondary | ICD-10-CM

## 2024-01-10 DIAGNOSIS — R0789 Other chest pain: Secondary | ICD-10-CM

## 2024-01-10 DIAGNOSIS — I700 Atherosclerosis of aorta: Secondary | ICD-10-CM

## 2024-01-10 DIAGNOSIS — E559 Vitamin D deficiency, unspecified: Secondary | ICD-10-CM

## 2024-01-10 DIAGNOSIS — I10 Essential (primary) hypertension: Secondary | ICD-10-CM

## 2024-01-10 DIAGNOSIS — E66812 Obesity, class 2: Secondary | ICD-10-CM | POA: Insufficient documentation

## 2024-01-10 DIAGNOSIS — K760 Fatty (change of) liver, not elsewhere classified: Secondary | ICD-10-CM

## 2024-01-10 DIAGNOSIS — K257 Chronic gastric ulcer without hemorrhage or perforation: Secondary | ICD-10-CM | POA: Insufficient documentation

## 2024-01-10 DIAGNOSIS — I471 Supraventricular tachycardia, unspecified: Secondary | ICD-10-CM | POA: Diagnosis not present

## 2024-01-10 DIAGNOSIS — Z96652 Presence of left artificial knee joint: Secondary | ICD-10-CM | POA: Insufficient documentation

## 2024-01-10 MED ORDER — CHOLECALCIFEROL 25 MCG (1000 UT) PO TABS: 1000.0000 [IU] | ORAL_TABLET | Freq: Every day | ORAL | 3 refills | Status: DC

## 2024-01-10 MED ORDER — AMLODIPINE BESYLATE 5 MG PO TABS: 5.0000 mg | ORAL_TABLET | Freq: Every day | ORAL | 3 refills | Status: DC

## 2024-01-10 MED ORDER — ASPIRIN 81 MG PO TBEC: 81.0000 mg | DELAYED_RELEASE_TABLET | Freq: Every day | ORAL | Status: DC

## 2024-01-10 MED ORDER — METHOCARBAMOL 750 MG PO TABS: 750.0000 mg | ORAL_TABLET | Freq: Four times a day (QID) | ORAL | 0 refills | Status: DC | PRN

## 2024-01-10 MED ORDER — LISINOPRIL 40 MG PO TABS: 40.0000 mg | ORAL_TABLET | Freq: Every day | ORAL | 3 refills | Status: DC

## 2024-01-10 MED ORDER — METOPROLOL SUCCINATE ER 25 MG PO TB24: 12.5000 mg | ORAL_TABLET | Freq: Every day | ORAL | 3 refills | Status: DC

## 2024-01-10 MED ORDER — EZETIMIBE 10 MG PO TABS
10.0000 mg | ORAL_TABLET | Freq: Every day | ORAL | 3 refills | Status: DC
Start: 2024-01-10 — End: 2024-03-13

## 2024-01-10 NOTE — Patient Instructions (Signed)
 VISIT SUMMARY:  During today's visit, we discussed your ongoing left arm pain and numbness, which has been diagnosed as cervical radiculopathy. We also reviewed your hypertension management, heartburn issues, and other chronic conditions. We have made some adjustments to your medications and planned further evaluations to better manage your symptoms.  YOUR PLAN:  -CERVICAL RADICULOPATHY WITH DEGENERATIVE DISEASE: Cervical radiculopathy is a condition where a nerve in the neck is compressed or irritated, causing pain and numbness that can radiate down the arm. We will start you on tizanidine to help with muscle discomfort and refer you to [facility] for a quicker appointment. Additionally, you will continue to follow up with orthopedics for a nerve conduction study.  -HYPERTENSION: Hypertension, or high blood pressure, is being managed with medications. Due to recent fluctuations, we are reducing your metoprolol dose to 12.5 mg daily to prevent low blood pressure. Please continue to monitor your blood pressure at home and we have refilled your amlodipine and lisinopril prescriptions.  -SUPRAVENTRICULAR TACHYCARDIA (SVT): SVT is a condition where the heart suddenly beats much faster than normal. Your SVT is well-managed with metoprolol, which we are continuing at a reduced dose of 12.5 mg daily.  -ANTIPHOSPHOLIPID SYNDROME: Antiphospholipid syndrome is an autoimmune disorder that increases the risk of blood clots. You will continue taking aspirin 81 mg daily to prevent clotting.  -GASTRIC ULCERS WITH HISTORY OF H. PYLORI INFECTION: Gastric ulcers are sores in the stomach lining, often caused by H. pylori infection. Since you are experiencing persistent heartburn, we will follow up with a gastroenterologist to reassess your treatment.  -METABOLIC DYSFUNCTION-ASSOCIATED STEATOTIC LIVER DISEASE (MASLD): MASLD, or fatty liver disease, is often related to metabolic issues. We recommend discussing  weight loss strategies to improve your liver health.  -SLEEP APNEA: Sleep apnea is a condition where breathing repeatedly stops and starts during sleep. You are managing this well with your CPAP machine.  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 It is important to continue regular mammograms to monitor for any potential issues.  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 No new issues were discussed regarding this today.  -GENERAL HEALTH MAINTENANCE: You are due for a physical examination, but we will focus on your acute medical issues first. Recent lab work and cardiac evaluations are well-managed.  INSTRUCTIONS:  Please follow up with the orthopedist for your nerve conduction study and with the gastroenterologist for your gastric ulcer management. Schedule a follow-up appointment with [place] in two months after addressing your current issues.  For more information, you can read your full clinical note, available in your patient portal.

## 2024-01-10 NOTE — Progress Notes (Signed)
 Assessment/Plan:  Total time spent caring for the patient today was 53 minutes. This includes time spent before the visit reviewing the chart, time spent during the visit, and time spent after the visit on documentation, etc.  Cervical radiculopathy with degenerative disease Chronic left-sided cervical radiculopathy with significant discomfort radiating from the shoulder to the fingers, associated with numbness. MRI revealed significant right-sided neuroforaminal stenosis at C5-C6 and multiple degenerative changes throughout the spine. Chiropractic care has provided some relief, but physical therapy appointments are delayed. A nerve conduction study is planned for further evaluation. Muscle relaxants are considered to manage pain without addiction risk. - Prescribe tizanidine for muscle discomfort - Refer to [facility] therapy for expedited appointment - Continue follow-up with orthopedics for nerve conduction study  Hypertension Primary hypertension with recent blood pressure fluctuations due to pain. Currently on metoprolol, amlodipine, and lisinopril. Home readings show some low values, likely due to metoprolol. Decision made to reduce metoprolol dosage to prevent hypotension while maintaining control. - Reduce metoprolol to 12.5 mg daily - Monitor blood pressure at home closely - Refill amlodipine and lisinopril prescriptions  Supraventricular tachycardia (SVT) SVT with previous cardiac evaluations including a negative stress test for ischemia and well-managed EKG. Currently managed with metoprolol, which also helps reduce palpitations. No current symptoms of rapid or irregular heart rate. - Continue metoprolol at reduced dose of 12.5 mg daily  Antiphospholipid syndrome Antiphospholipid syndrome managed with aspirin for thromboprophylaxis. Previous D-dimer elevation led to evaluation for blood clots, which were ruled out. Aspirin is continued to prevent thrombotic events. - Continue  aspirin 81 mg daily  Gastric ulcers with history of H. pylori infection Gastric ulcers due to H. pylori infection, previously treated. Current symptoms of heartburn suggest possible recurrence or inadequate treatment. She is on omeprazole but reports persistent heartburn. Follow-up with gastroenterologist is planned to reassess treatment. - Follow up with gastroenterologist for further evaluation and management of gastric ulcers  Metabolic dysfunction-associated steatotic liver disease (MASLD) associated with Class II Obesity with BMI of 39 Steatotic disease likely related to metabolic syndrome and obesity.. Weight loss is recommended to improve liver health and other health conditions including sleep apnea, GERD, hypertension, osteoarthritis. Further evaluation may be considered as she is managed for other conditions. -Encourage healthy lifestyle including low-carb, low-fat diet high in fruits and vegetables and regular exercise of at least 100 minutes of moderate level activity to promote weight loss strategies to improve overall health including liver  Sleep apnea Sleep apnea managed with CPAP therapy. She reports adherence to CPAP use.  History of bilateral breast cancer Hormone-positive bilateral breast cancer treated with surgery. She continues to have mammograms despite bilateral mastectomy due to potential residual breast tissue.  Last mammogram September 07, 2022 showed no mammographic evidence of breast malignancy. - Continue regular annual mammograms - Patient is due plan to discuss at follow-up physical  History of total knee replacement Left total knee replacement due to severe meniscal tear.  Vitamin D deficiency Takes 1000 units daily as well as OTC calcium supplementation -Continue current vitamin D dosing-  General Health Maintenance She is due for a physical examination, but current focus is on addressing acute  medical issues. Recent lab work and cardiac evaluations are  well-managed. - Schedule physical examination after resolution of current acute issues  Follow-up She has multiple ongoing medical issues requiring follow-up with various specialists. Current focus is on managing acute issues before scheduling routine health maintenance. - Follow up with orthopedist for nerve conduction study -  Follow up with gastroenterologist for gastric ulcer management - Schedule follow-up appointment in two months after addressing current issues         Medications Discontinued During This Encounter  Medication Reason   albuterol (VENTOLIN HFA) 108 (90 Base) MCG/ACT inhaler    albuterol (VENTOLIN HFA) 108 (90 Base) MCG/ACT inhaler    Famotidine-Ca Carb-Mag Hydrox (PEPCID COMPLETE PO)    Lactobacillus (PROBIOTIC ACIDOPHILUS PO)    simethicone (GAS-X) 80 MG chewable tablet    aspirin EC 81 MG tablet    ezetimibe (ZETIA) 10 MG tablet Reorder   lisinopril (ZESTRIL) 40 MG tablet Reorder   Cholecalciferol 25 MCG (1000 UT) tablet Reorder   amLODipine (NORVASC) 5 MG tablet Reorder   metoprolol succinate (TOPROL-XL) 25 MG 24 hr tablet Reorder    Return in about 2 months (around 03/11/2024) for physical (fasting labs).    Subjective:   Encounter date: 01/10/2024  Kirsten Choi is a 68 y.o. female who has Paroxysmal SVT (supraventricular tachycardia) (HCC); Hypertension; Bradycardia; OSA (obstructive sleep apnea); Dyspnea; Class 2 severe obesity due to excess calories with serious comorbidity and body mass index (BMI) of 39.0 to 39.9 in adult [facility]); Vitamin D deficiency; Thoracic aortic atherosclerosis (HCC); History of Helicobacter pylori infection; Acute anterior lower chest wall pain; Chronic gastric ulcer without hemorrhage and without perforation; S/P TKR (total knee replacement), left; History of bilateral breast cancer; Metabolic dysfunction-associated steatotic liver disease (MASLD); Cervical radiculopathy due to degenerative joint disease of spine; and  Antiphospholipid syndrome (HCC) on their problem list..   She  has a past medical history of Arthritis, Cancer (HCC), Hypertension, and Migraine..   She presents with chief complaint of Establish Care (neck pain radiating down her L arm, she has had MRI, getting a nerve study scheduled at end of month. Can't see PT until 02/05/2024. Would like a new PT referral. ) .   Discussed the use of AI scribe software for clinical note transcription with the patient, who gave verbal consent to proceed.  History of Present Illness   Kirsten Choi is a 68 year old female with cervical radiculopathy and hypertension who presents with left arm pain and numbness.  She has been experiencing significant pain in her left arm for approximately six weeks. The pain originates in the shoulder and shoulder blade, radiating down through the elbow and into the wrist, with the most severe pain and numbness in the fingers. Initially, the numbness was intermittent, but now two fingers remain numb consistently. An MRI and ultrasound revealed significant right-sided neuroforaminal stenosis at C5-C6, with multiple degenerative changes throughout the spine. She has been seeing a chiropractor three times a week, which has reduced her pain from a ten to a more manageable level, but she has not been able to see her this week due to his illness. She also started physical therapy but has difficulty securing timely appointments.  She has a history of primary hypertension, for which she is on metoprolol 25 mg daily, amlodipine 5 mg, and lisinopril 40 mg. Her blood pressure has been fluctuating, with readings ranging from 93/45 to 141/74, often higher when in pain. She is concerned about the potential side effects of metoprolol, particularly regarding pain under her left breast, which she describes as tender and painful when moving or lying down. This pain has been present since starting metoprolol. No symptomatic rapid heart rate, irregular  heart rate, chest pain, or shortness of breath.  She has experienced heartburn and was previously treated for H. pylori  infection with gastric ulcers. She is currently on omeprazole but feels the dosage may be insufficient as she continues to experience heartburn.  She has a history of antiphospholipid syndrome, for which she takes aspirin 81 mg daily. She also has a history of bilateral breast cancer treated with chemotherapy and an abdominal tram flap, which she believes contributes to her spinal issues due to lack of core strength. She has a history of sleep apnea and uses a CPAP machine regularly.  She reports a history of fatty liver disease and vitamin D deficiency, for which she takes calcifediol 1000 units daily. She also takes ezetimibe 10 mg for aortic atherosclerosis.       ROS  Past Surgical History:  Procedure Laterality Date   BREAST SURGERY     COSMETIC SURGERY     HERNIA REPAIR     REPLACEMENT TOTAL KNEE     WRIST SURGERY      Outpatient Medications Prior to Visit  Medication Sig Dispense Refill   omeprazole (PRILOSEC) 40 MG capsule Take 40 mg by mouth 2 (two) times daily.     psyllium (METAMUCIL) 58.6 % packet Take 1 packet by mouth daily.     amLODipine (NORVASC) 5 MG tablet Take 5 mg by mouth daily.     aspirin EC 81 MG tablet Take 81 mg by mouth daily. Swallow whole.     Cholecalciferol 25 MCG (1000 UT) tablet Take by mouth.     ezetimibe (ZETIA) 10 MG tablet Take 1 tablet by mouth daily.     lisinopril (ZESTRIL) 40 MG tablet Take 40 mg by mouth daily.     metoprolol succinate (TOPROL-XL) 25 MG 24 hr tablet Take 12.5 mg by mouth daily.     albuterol (VENTOLIN HFA) 108 (90 Base) MCG/ACT inhaler Inhale 1-2 puffs into the lungs every 6 (six) hours as needed. (Patient not taking: Reported on 01/10/2024) 8 g 2   albuterol (VENTOLIN HFA) 108 (90 Base) MCG/ACT inhaler Inhale 2 puffs into the lungs every 6 (six) hours as needed for wheezing or shortness of breath. (Patient  not taking: Reported on 01/10/2024) 8 g 6   Famotidine-Ca Carb-Mag Hydrox (PEPCID COMPLETE PO) Take 1 tablet by mouth daily. (Patient not taking: Reported on 01/10/2024)     Lactobacillus (PROBIOTIC ACIDOPHILUS PO) Take 1 tablet by mouth daily. (Patient not taking: Reported on 01/10/2024)     simethicone (GAS-X) 80 MG chewable tablet Chew 1 tablet (80 mg total) by mouth every 6 (six) hours as needed (abdominal pain and gas). (Patient not taking: Reported on 01/10/2024) 30 tablet 0   No facility-administered medications prior to visit.    Family History  Problem Relation Age of Onset   Leukemia Mother    Stroke Father    CAD Father    Cancer Father     Social History   Socioeconomic History   Marital status: Widowed    Spouse name: Not on file   Number of children: Not on file   Years of education: Not on file   Highest education level: Not on file  Occupational History   Not on file  Tobacco Use   Smoking status: Never   Smokeless tobacco: Never  Vaping Use   Vaping status: Never Used  Substance and Sexual Activity   Alcohol use: Never   Drug use: Never   Sexual activity: Not on file  Other Topics Concern   Not on file  Social History Narrative   Not on file  Social Drivers of Health   Financial Resource Strain: Unknown (08/15/2022)   Received from [facility]   Overall Financial Resource Strain (CARDIA)  Food Insecurity: Low Risk  (12/13/2023)   Received from [facility]   Hunger Vital Sign    Worried About Running Out of Food in the Last Year: Never true    Ran Out of Food in the Last Year: Never true  Transportation Needs: No Transportation Needs (12/13/2023)   Received from [facility]    In the past 12 months, has lack of reliable transportation kept you from medical appointments, meetings, work or from getting things needed for daily living? : No  Physical Activity: Sufficiently Active (08/15/2022)   Received from [facility]   Exercise  Vital Sign  Stress: No Stress Concern Present (08/15/2022)   [facility]   [employer] of Occupational Health - Occupational Stress Questionnaire  Social Connections: Unknown (08/15/2022)   Received from [facility] visits prior to 01/06/2023., [facility] visits prior to 01/06/2023.   Social Advertising account executive [NHANES]    Frequency of Communication with Friends and Family: More than three times a week    Frequency of Social Gatherings with Friends and Family: More than three times a week    Attends Religious Services: More than 4 times per year    Active Member of Clubs or Organizations: Yes    Attends Banker Meetings: More than 4 times per year    Marital Status: Patient refused  Intimate Partner Violence: Unknown (08/15/2022)   [facility] visits prior to 01/06/2023., [facility] visits prior to 01/06/2023.   Humiliation, Afraid, Rape, and Kick questionnaire    Fear of Current or Ex-Partner: Patient refused    Emotionally Abused: Patient refused    Physically Abused: Patient refused    Sexually Abused: Patient refused                                                                                                  Objective:  Physical Exam: BP 136/82   Pulse 77   Temp (!) 97.5 F (36.4 C) (Temporal)   Wt 237 lb 9.6 oz (107.8 kg)   SpO2 97%   BMI 39.54 kg/m     Physical Exam  DG Chest Portable 1 View Result Date: 10/17/2023 CLINICAL DATA:  choking episode, globus sensation EXAM: PORTABLE CHEST 1 VIEW COMPARISON:  None Available. FINDINGS: The heart size and mediastinal contours are within normal limits. Elevation of the right hemidiaphragm. Both lungs are clear. No radiopaque foreign bodies within the chest. The visualized skeletal structures are unremarkable. IMPRESSION: No active disease. Electronically Signed   By: Duanne Guess  D.O.   On: 10/17/2023 11:02    No results found for this or any previous visit (from the past 2160 hours).    MRI Spine Cervical WO Contrast  Anatomical Region Laterality Modality  C-spine -- Magnetic Resonance   Impression  1. Multilevel cervical spondylosis as described above. Moderate  to severe right neuroforaminal stenosis at C5-C6.   Electronically Signed   By: Obie Dredge M.D.   On: 12/27/2023 12:55 Narrative  CLINICAL DATA:  Cervical radiculopathy.  EXAM: MRI CERVICAL SPINE WITHOUT CONTRAST  TECHNIQUE: Multiplanar, multisequence MR imaging of the cervical spine was performed. No intravenous contrast was administered.  COMPARISON:  Cervical spine x-rays dated December 17, 2023. CT cervical spine dated May 10, 2021.  FINDINGS: Alignment: Trace anterolisthesis at C7-T1 and T1-T2.  Vertebrae: No fracture, evidence of discitis, or bone lesion.  Cord: Normal signal and morphology.  Posterior Fossa, vertebral arteries, paraspinal tissues: Negative.  Disc levels:  C2-C3: Negative disc. Mild bilateral facet arthropathy. No stenosis.  C3-C4: Negative disc. Mild bilateral facet uncovertebral hypertrophy. No stenosis.  C4-C5: Small central disc protrusion. Mild right uncovertebral hypertrophy. Moderate bilateral facet arthropathy. Mild right neuroforaminal stenosis. No spinal canal or left neuroforaminal stenosis.  C5-C6: Broad-based posterior disc osteophyte complex eccentric to the right. Moderate right and mild left facet uncovertebral hypertrophy. Moderate to severe right neuroforaminal stenosis. Mild spinal canal and left neuroforaminal stenosis.  C6-C7: Mild disc bulging and bilateral facet uncovertebral hypertrophy. Mild bilateral neuroforaminal stenosis. No spinal canal stenosis.  C7-T1: Tiny central disc protrusion. Moderate bilateral facet arthropathy. No stenosis. Procedure Note  Rica Records, MD - 12/27/2023 Formatting of this  note might be different from the original. CLINICAL DATA:  Cervical radiculopathy.  EXAM: MRI CERVICAL SPINE WITHOUT CONTRAST  TECHNIQUE: Multiplanar, multisequence MR imaging of the cervical spine was performed. No intravenous contrast was administered.  COMPARISON:  Cervical spine x-rays dated December 17, 2023. CT cervical spine dated May 10, 2021.  FINDINGS: Alignment: Trace anterolisthesis at C7-T1 and T1-T2.  Vertebrae: No fracture, evidence of discitis, or bone lesion.  Cord: Normal signal and morphology.  Posterior Fossa, vertebral arteries, paraspinal tissues: Negative.  Disc levels:  C2-C3: Negative disc. Mild bilateral facet arthropathy. No stenosis.  C3-C4: Negative disc. Mild bilateral facet uncovertebral hypertrophy. No stenosis.  C4-C5: Small central disc protrusion. Mild right uncovertebral hypertrophy. Moderate bilateral facet arthropathy. Mild right neuroforaminal stenosis. No spinal canal or left neuroforaminal stenosis.  C5-C6: Broad-based posterior disc osteophyte complex eccentric to the right. Moderate right and mild left facet uncovertebral hypertrophy. Moderate to severe right neuroforaminal stenosis. Mild spinal canal and left neuroforaminal stenosis.  C6-C7: Mild disc bulging and bilateral facet uncovertebral hypertrophy. Mild bilateral neuroforaminal stenosis. No spinal canal stenosis.  C7-T1: Tiny central disc protrusion. Moderate bilateral facet arthropathy. No stenosis.  IMPRESSION: 1. Multilevel cervical spondylosis as described above. Moderate to severe right neuroforaminal stenosis at C5-C6.   Electronically Signed   By: Obie Dredge M.D.   On: 12/27/2023 12:55 Exam End: 12/20/23 10:10   Specimen Collected: 12/27/23 12:49 Last Resulted: 12/27/23 12:55  Received From: [facility]  Result Received: 01/10/24 09:21   View Encounter   XR Spine Cervical Complete 4-5 Views  Anatomical Region Laterality Modality  Spine --  Digital Radiography   Narrative  This result has an attachment that is not available. I have independently reviewed and interpreted the radiographs of the Cervical spine which demonstrates loss of cervical lordosis.  Degenerative disc spacing with anterior osteophyte formation at C3-4.  Degenerative disc spacing C4-5 C5-6.  Overlapping anatomy obscures C5 and below.  C2-C5 no abnormal motion with flexion and extension.  No acute fractures. Exam End: 12/17/23 15:18 Last Resulted: 12/24/23 07:48  Received From: [facility]  Result Received: 12/27/23 12:48   View Encounter   [place] Peripheral Venous Arm  Unilat Left  Anatomical Region Laterality Modality  Vascular -- Ultrasound   Impression  No evidence of DVT or superficial thrombophlebitis within the LEFT upper extremity.  Roanna Banning, MD  [facility]  [facility]   Electronically Signed   By: Roanna Banning M.D.   On: 12/13/2023 14:04 Narrative  CLINICAL DATA:  Chest pain, unspecified  EXAM: LEFT UPPER EXTREMITY VENOUS DOPPLER ULTRASOUND  TECHNIQUE: Gray-scale sonography with graded compression, as well as color Doppler and duplex ultrasound were performed to evaluate the upper extremity deep venous system from the level of the subclavian vein and including the jugular, axillary, basilic, radial, ulnar and upper cephalic vein. Spectral Doppler was utilized to evaluate flow at rest and with distal augmentation maneuvers.  COMPARISON:  CTA chest, 12/07/2023.  FINDINGS: VENOUS  Normal compressibility of the LEFT internal jugular, subclavian, axillary, cephalic, basilic, brachial, radial and ulnar veins. No filling defects to suggest DVT on grayscale or color Doppler imaging. Doppler waveforms show normal direction of venous flow, normal respiratory plasticity and response to augmentation.  Limited views of the contralateral subclavian vein are  unremarkable.  OTHER  No evidence of superficial thrombophlebitis or abnormal fluid collection.  Limitations: Patient could not tolerate compressions Procedure Note  Roanna Banning, MD - 12/13/2023 Formatting of this note might be different from the original. CLINICAL DATA:  Chest pain, unspecified  EXAM: LEFT UPPER EXTREMITY VENOUS DOPPLER ULTRASOUND  TECHNIQUE: Gray-scale sonography with graded compression, as well as color Doppler and duplex ultrasound were performed to evaluate the upper extremity deep venous system from the level of the subclavian vein and including the jugular, axillary, basilic, radial, ulnar and upper cephalic vein. Spectral Doppler was utilized to evaluate flow at rest and with distal augmentation maneuvers.  COMPARISON:  CTA chest, 12/07/2023.  FINDINGS: VENOUS  Normal compressibility of the LEFT internal jugular, subclavian, axillary, cephalic, basilic, brachial, radial and ulnar veins. No filling defects to suggest DVT on grayscale or color Doppler imaging. Doppler waveforms show normal direction of venous flow, normal respiratory plasticity and response to augmentation.  Limited views of the contralateral subclavian vein are unremarkable.  OTHER  No evidence of superficial thrombophlebitis or abnormal fluid collection.  Limitations: Patient could not tolerate compressions  IMPRESSION: No evidence of DVT or superficial thrombophlebitis within the LEFT upper extremity.  Roanna Banning, MD  [facility]  [facility]   Electronically Signed   By: Roanna Banning M.D.   On: 12/13/2023 14:04 Exam End: 12/13/23 13:52   Specimen Collected: 12/13/23 14:03 Last Resulted: 12/13/23 14:04  Received From: [facility]  Result Received: 12/27/23 12:48   View Encounter   CT Angio Chest Pulmonary Embolism  Anatomical Region Laterality Modality  Chest -- Computed Tomography    Impression  Chest:  1. No evidence of pulmonary embolus. 2. No acute intrathoracic process. 3.  Aortic Atherosclerosis (ICD10-I70.0).  Abdomen/pelvis:  1. No acute intra-abdominal or intrapelvic process. 2. Hepatic steatosis. 3. Small fat containing infraumbilical ventral hernia. No bowel herniation. 4.  Aortic Atherosclerosis (ICD10-I70.0).   Electronically Signed   By: Sharlet Salina M.D.   On: 12/07/2023 17:57 Narrative  CLINICAL DATA:  Elevated D-dimer, left arm and chest pain, left upper quadrant pain, bloating  EXAM: CT ANGIOGRAPHY CHEST  CT ABDOMEN AND PELVIS WITH CONTRAST  TECHNIQUE: Multidetector CT imaging of the chest was performed using the standard protocol during bolus administration of intravenous contrast. Multiplanar CT image reconstructions and MIPs were obtained to evaluate the  vascular anatomy. Multidetector CT imaging of the abdomen and pelvis was performed using the standard protocol during bolus administration of intravenous contrast.  RADIATION DOSE REDUCTION: This exam was performed according to the departmental dose-optimization program which includes automated exposure control, adjustment of the mA and/or kV according to patient size and/or use of iterative reconstruction technique.  CONTRAST:  100 mL iohexoL (OMNIPAQUE) 350 mg iodine/mL injection (MDV) 100 mL  COMPARISON:  06/15/2022, 12/06/2022  FINDINGS: CTA CHEST FINDINGS  Cardiovascular: This is a technically adequate evaluation of the pulmonary vasculature. No filling defects or pulmonary emboli.  The heart is unremarkable without pericardial effusion. No evidence of thoracic aortic aneurysm or dissection. Atherosclerosis of the aortic arch.  Mediastinum/Nodes: No enlarged mediastinal, hilar, or axillary lymph nodes. Thyroid gland, trachea, and esophagus demonstrate no significant findings.  Lungs/Pleura: No acute airspace disease, effusion, or pneumothorax. Central  airways are patent.  Musculoskeletal: No acute or destructive bony abnormalities. Reconstructed images demonstrate no additional findings.  Review of the MIP images confirms the above findings.  CT ABDOMEN and PELVIS FINDINGS  Hepatobiliary: Decreased liver attenuation consistent with hepatic steatosis. Prior cholecystectomy. No biliary duct dilation.  Pancreas: Unremarkable. No pancreatic ductal dilatation or surrounding inflammatory changes.  Spleen: Normal in size without focal abnormality.  Adrenals/Urinary Tract: Adrenal glands are unremarkable. Kidneys are normal, without renal calculi, focal lesion, or hydronephrosis. Bladder is unremarkable.  Stomach/Bowel: No bowel obstruction or ileus. Normal appendix central lower abdomen. No bowel wall thickening or inflammatory change.  Vascular/Lymphatic: Aortic atherosclerosis. No enlarged abdominal or pelvic lymph nodes.  Reproductive: Uterus and bilateral adnexa are unremarkable.  Other: No free fluid or free intraperitoneal gas. Small fat containing infraumbilical ventral hernia unchanged. No bowel herniation.  Musculoskeletal: No acute or destructive bony abnormalities. Reconstructed images demonstrate no additional findings.  Review of the MIP images confirms the above findings. Procedure Note  Hinton Dyer, MD - 12/07/2023 Formatting of this note might be different from the original. CLINICAL DATA:  Elevated D-dimer, left arm and chest pain, left upper quadrant pain, bloating  EXAM: CT ANGIOGRAPHY CHEST  CT ABDOMEN AND PELVIS WITH CONTRAST  TECHNIQUE: Multidetector CT imaging of the chest was performed using the standard protocol during bolus administration of intravenous contrast. Multiplanar CT image reconstructions and MIPs were obtained to evaluate the vascular anatomy. Multidetector CT imaging of the abdomen and pelvis was performed using the standard protocol during bolus administration of  intravenous contrast.  RADIATION DOSE REDUCTION: This exam was performed according to the departmental dose-optimization program which includes automated exposure control, adjustment of the mA and/or kV according to patient size and/or use of iterative reconstruction technique.  CONTRAST:  100 mL iohexoL (OMNIPAQUE) 350 mg iodine/mL injection (MDV) 100 mL  COMPARISON:  06/15/2022, 12/06/2022  FINDINGS: CTA CHEST FINDINGS  Cardiovascular: This is a technically adequate evaluation of the pulmonary vasculature. No filling defects or pulmonary emboli.  The heart is unremarkable without pericardial effusion. No evidence of thoracic aortic aneurysm or dissection. Atherosclerosis of the aortic arch.  Mediastinum/Nodes: No enlarged mediastinal, hilar, or axillary lymph nodes. Thyroid gland, trachea, and esophagus demonstrate no significant findings.  Lungs/Pleura: No acute airspace disease, effusion, or pneumothorax. Central airways are patent.  Musculoskeletal: No acute or destructive bony abnormalities. Reconstructed images demonstrate no additional findings.  Review of the MIP images confirms the above findings.  CT ABDOMEN and PELVIS FINDINGS  Hepatobiliary: Decreased liver attenuation consistent with hepatic steatosis. Prior cholecystectomy. No biliary duct dilation.  Pancreas: Unremarkable. No pancreatic ductal dilatation  or surrounding inflammatory changes.  Spleen: Normal in size without focal abnormality.  Adrenals/Urinary Tract: Adrenal glands are unremarkable. Kidneys are normal, without renal calculi, focal lesion, or hydronephrosis. Bladder is unremarkable.  Stomach/Bowel: No bowel obstruction or ileus. Normal appendix central lower abdomen. No bowel wall thickening or inflammatory change.  Vascular/Lymphatic: Aortic atherosclerosis. No enlarged abdominal or pelvic lymph nodes.  Reproductive: Uterus and bilateral adnexa are unremarkable.  Other: No free  fluid or free intraperitoneal gas. Small fat containing infraumbilical ventral hernia unchanged. No bowel herniation.  Musculoskeletal: No acute or destructive bony abnormalities. Reconstructed images demonstrate no additional findings.  Review of the MIP images confirms the above findings.  IMPRESSION: Chest:  1. No evidence of pulmonary embolus. 2. No acute intrathoracic process. 3.  Aortic Atherosclerosis (ICD10-I70.0).  Abdomen/pelvis:  1. No acute intra-abdominal or intrapelvic process. 2. Hepatic steatosis. 3. Small fat containing infraumbilical ventral hernia. No bowel herniation. 4.  Aortic Atherosclerosis (ICD10-I70.0).   Electronically Signed   By: Sharlet Salina M.D.   On: 12/07/2023 17:57 Exam End: 12/07/23 17:16   Specimen Collected: 12/07/23 17:50 Last Resulted: 12/07/23 17:57  Received From: [facility]  Result Received: 12/27/23 12:48   View Encounter   CT Abdomen Pelvis W Contrast  Anatomical Region Laterality Modality  Abdomen -- Computed Tomography  Pelvis -- --   Impression  Chest:  1. No evidence of pulmonary embolus. 2. No acute intrathoracic process. 3.  Aortic Atherosclerosis (ICD10-I70.0).  Abdomen/pelvis:  1. No acute intra-abdominal or intrapelvic process. 2. Hepatic steatosis. 3. Small fat containing infraumbilical ventral hernia. No bowel herniation. 4.  Aortic Atherosclerosis (ICD10-I70.0).   Electronically Signed   By: Sharlet Salina M.D.   On: 12/07/2023 17:57 Narrative  CLINICAL DATA:  Elevated D-dimer, left arm and chest pain, left upper quadrant pain, bloating  EXAM: CT ANGIOGRAPHY CHEST  CT ABDOMEN AND PELVIS WITH CONTRAST  TECHNIQUE: Multidetector CT imaging of the chest was performed using the standard protocol during bolus administration of intravenous contrast. Multiplanar CT image reconstructions and MIPs were obtained to evaluate the vascular anatomy. Multidetector CT imaging of the abdomen and pelvis  was performed using the standard protocol during bolus administration of intravenous contrast.  RADIATION DOSE REDUCTION: This exam was performed according to the departmental dose-optimization program which includes automated exposure control, adjustment of the mA and/or kV according to patient size and/or use of iterative reconstruction technique.  CONTRAST:  100 mL iohexoL (OMNIPAQUE) 350 mg iodine/mL injection (MDV) 100 mL  COMPARISON:  06/15/2022, 12/06/2022  FINDINGS: CTA CHEST FINDINGS  Cardiovascular: This is a technically adequate evaluation of the pulmonary vasculature. No filling defects or pulmonary emboli.  The heart is unremarkable without pericardial effusion. No evidence of thoracic aortic aneurysm or dissection. Atherosclerosis of the aortic arch.  Mediastinum/Nodes: No enlarged mediastinal, hilar, or axillary lymph nodes. Thyroid gland, trachea, and esophagus demonstrate no significant findings.  Lungs/Pleura: No acute airspace disease, effusion, or pneumothorax. Central airways are patent.  Musculoskeletal: No acute or destructive bony abnormalities. Reconstructed images demonstrate no additional findings.  Review of the MIP images confirms the above findings.  CT ABDOMEN and PELVIS FINDINGS  Hepatobiliary: Decreased liver attenuation consistent with hepatic steatosis. Prior cholecystectomy. No biliary duct dilation.  Pancreas: Unremarkable. No pancreatic ductal dilatation or surrounding inflammatory changes.  Spleen: Normal in size without focal abnormality.  Adrenals/Urinary Tract: Adrenal glands are unremarkable. Kidneys are normal, without renal calculi, focal lesion, or hydronephrosis. Bladder is unremarkable.  Stomach/Bowel: No bowel obstruction or ileus. Normal appendix central  lower abdomen. No bowel wall thickening or inflammatory change.  Vascular/Lymphatic: Aortic atherosclerosis. No enlarged abdominal or pelvic lymph  nodes.  Reproductive: Uterus and bilateral adnexa are unremarkable.  Other: No free fluid or free intraperitoneal gas. Small fat containing infraumbilical ventral hernia unchanged. No bowel herniation.  Musculoskeletal: No acute or destructive bony abnormalities. Reconstructed images demonstrate no additional findings.  Review of the MIP images confirms the above findings. Procedure Note  Hinton Dyer, MD - 12/07/2023 Formatting of this note might be different from the original. CLINICAL DATA:  Elevated D-dimer, left arm and chest pain, left upper quadrant pain, bloating  EXAM: CT ANGIOGRAPHY CHEST  CT ABDOMEN AND PELVIS WITH CONTRAST  TECHNIQUE: Multidetector CT imaging of the chest was performed using the standard protocol during bolus administration of intravenous contrast. Multiplanar CT image reconstructions and MIPs were obtained to evaluate the vascular anatomy. Multidetector CT imaging of the abdomen and pelvis was performed using the standard protocol during bolus administration of intravenous contrast.  RADIATION DOSE REDUCTION: This exam was performed according to the departmental dose-optimization program which includes automated exposure control, adjustment of the mA and/or kV according to patient size and/or use of iterative reconstruction technique.  CONTRAST:  100 mL iohexoL (OMNIPAQUE) 350 mg iodine/mL injection (MDV) 100 mL  COMPARISON:  06/15/2022, 12/06/2022  FINDINGS: CTA CHEST FINDINGS  Cardiovascular: This is a technically adequate evaluation of the pulmonary vasculature. No filling defects or pulmonary emboli.  The heart is unremarkable without pericardial effusion. No evidence of thoracic aortic aneurysm or dissection. Atherosclerosis of the aortic arch.  Mediastinum/Nodes: No enlarged mediastinal, hilar, or axillary lymph nodes. Thyroid gland, trachea, and esophagus demonstrate no significant findings.  Lungs/Pleura: No acute  airspace disease, effusion, or pneumothorax. Central airways are patent.  Musculoskeletal: No acute or destructive bony abnormalities. Reconstructed images demonstrate no additional findings.  Review of the MIP images confirms the above findings.  CT ABDOMEN and PELVIS FINDINGS  Hepatobiliary: Decreased liver attenuation consistent with hepatic steatosis. Prior cholecystectomy. No biliary duct dilation.  Pancreas: Unremarkable. No pancreatic ductal dilatation or surrounding inflammatory changes.  Spleen: Normal in size without focal abnormality.  Adrenals/Urinary Tract: Adrenal glands are unremarkable. Kidneys are normal, without renal calculi, focal lesion, or hydronephrosis. Bladder is unremarkable.  Stomach/Bowel: No bowel obstruction or ileus. Normal appendix central lower abdomen. No bowel wall thickening or inflammatory change.  Vascular/Lymphatic: Aortic atherosclerosis. No enlarged abdominal or pelvic lymph nodes.  Reproductive: Uterus and bilateral adnexa are unremarkable.  Other: No free fluid or free intraperitoneal gas. Small fat containing infraumbilical ventral hernia unchanged. No bowel herniation.  Musculoskeletal: No acute or destructive bony abnormalities. Reconstructed images demonstrate no additional findings.  Review of the MIP images confirms the above findings.  IMPRESSION: Chest:  1. No evidence of pulmonary embolus. 2. No acute intrathoracic process. 3.  Aortic Atherosclerosis (ICD10-I70.0).  Abdomen/pelvis:  1. No acute intra-abdominal or intrapelvic process. 2. Hepatic steatosis. 3. Small fat containing infraumbilical ventral hernia. No bowel herniation. 4.  Aortic Atherosclerosis (ICD10-I70.0).   Electronically Signed   By: Sharlet Salina M.D.   On: 12/07/2023 17:57 Exam End: 12/07/23 17:16   Specimen Collected: 12/07/23 17:50 Last Resulted: 12/07/23 17:57  Received From: [facility]  Result Received: 12/27/23 12:48   View  Encounter   ECG 12 lead  Narrative Performed by [facility] CV WF MUSE This result has an attachment that is not available. Ventricular Rate  109       BPM                 Atrial Rate                        109       BPM                 P-R Interval                       138       ms                   QRS Duration                       84        ms                   Q-T Interval                       326       ms                   QTC Calculation Bazett             439       ms                   Calculated P Axis                  40        degrees             Calculated R Axis                  -14       degrees             Calculated T Axis                  41        degrees              Sinus tachycardia Possible Left atrial enlargement Possible LVH   R in aVL   Otherwise normal When compared with ECG of 07-Dec-2023 08 55, No significant change was found Confirmed by Seward Speck  [phone]  on 12-07-2023 2 58 40 PM Procedure Note  Seward Speck, MD - 12/07/2023 Formatting of this note might be different from the original. Ventricular Rate                   109       BPM                 Atrial Rate                        109       BPM                 P-R Interval                       138       ms                   QRS Duration  84        ms                   Q-T Interval                       326       ms                   QTC Calculation Bazett             439       ms                   Calculated P Axis                  40        degrees             Calculated R Axis                  -14       degrees             Calculated T Axis                  41        degrees              Sinus tachycardia Possible Left atrial enlargement Possible LVH   R in aVL   Otherwise normal When compared with ECG of 07-Dec-2023 08 55, No significant change was found Confirmed by Seward Speck  [phone]  on 12-07-2023 2 58 40 PM Specimen Collected: 12/07/23 14:42    Performed by: Nada Maclachlan CV WF MUSE Last Resulted: 12/07/23 14:58  Received From: [facility]  Result Received: 12/27/23 12:48   View Encounter   XR Chest 2 Views  Anatomical Region Laterality Modality  Chest -- Digital Radiography   Impression  No definite acute cardiopulmonary abnormality. Narrative  XR CHEST 2 VIEWS, 12/07/2023 9:26 AM  INDICATION: Heartburn \ R12 Heartburn \ M79.602 Pain in left arm COMPARISON: June 02, 2022  FINDINGS:  Cardiovascular: Cardiac silhouette and pulmonary vasculature are within normal limits. Mediastinum: Within normal limits. Lungs/pleura: No pneumothorax or pleural effusion. No consolidation or pulmonary edema. Upper abdomen: Visualized portions are unremarkable. Cholecystectomy clips. Surgical clips noted within the right axilla. Chest wall/osseous structures: Polyarticular degenerative changes. Procedure Note  Blima Singer, MD - 12/07/2023 Formatting of this note might be different from the original. XR CHEST 2 VIEWS, 12/07/2023 9:26 AM  INDICATION: Heartburn \ R12 Heartburn \ M79.602 Pain in left arm COMPARISON: June 02, 2022  FINDINGS:  Cardiovascular: Cardiac silhouette and pulmonary vasculature are within normal limits. Mediastinum: Within normal limits. Lungs/pleura: No pneumothorax or pleural effusion. No consolidation or pulmonary edema. Upper abdomen: Visualized portions are unremarkable. Cholecystectomy clips. Surgical clips noted within the right axilla. Chest wall/osseous structures: Polyarticular degenerative changes.   IMPRESSION: No definite acute cardiopulmonary abnormality. Exam End: 12/07/23 09:26   Specimen Collected: 12/07/23 09:33 Last Resulted: 12/07/23 09:34  Received From: [facility]  Result Received: 12/27/23 12:48   View Encounter   ECG 12 lead  Narrative Performed by [facility] CV WF MUSE This result has an attachment that is not available. Ventricular Rate                   95        BPM  Atrial Rate                        95        BPM                 P-R Interval                       142       ms                   QRS Duration                       82        ms                   Q-T Interval                       344       ms                   QTC Calculation Bazett             432       ms                   Calculated P Axis                  65        degrees             Calculated R Axis                  -8        degrees             Calculated T Axis                  38        degrees              Sinus rhythm Normal ECG When compared with ECG of 23-Apr-2023 10 02, No significant change was found Confirmed by Luella Cook  70  on 12-18-2023 9 24 07 AM Procedure Note  Elder Cyphers, MD - 12/18/2023 Formatting of this note might be different from the original. Ventricular Rate                   95        BPM                 Atrial Rate                        95        BPM                 P-R Interval                       142       ms                   QRS Duration                       82        ms  Q-T Interval                       344       ms                   QTC Calculation Bazett             432       ms                   Calculated P Axis                  65        degrees             Calculated R Axis                  -8        degrees             Calculated T Axis                  38        degrees              Sinus rhythm Normal ECG When compared with ECG of 23-Apr-2023 10 02, No significant change was found Confirmed by Luella Cook  70  on 12-18-2023 9 24 07 AM Specimen Collected: 12/07/23 08:55   Performed by: Nada Maclachlan CV WF MUSE Last Resulted: 12/18/23 09:24  Received From: [facility]  Result Received: 01/10/24 09:21   View Encounter   Transthoracic echo (TTE) complete  Anatomical Region Laterality Modality  -- -- Ultrasound   Narrative  This result has an attachment that is not available.                                                    [facility]                                                 [facility]                                                 [facility]  Cardiac                                                 Ultrasound-                                                 [place],                                                [facility]                                                    Bldg                                                  [address]                                                 [place]                                                  [place] [address]                                  Transthoracic Echocardiogram Report Name  IDALIS, HOELTING                            Study Date  09-17-2023              Height  64 in MRN  [id number]                                    Patient Location  [id number]          Weight  231 lb DOB  [birth year]                                  Gender  Female  BSA  2.1 m2 Age  55 yrs                                      Ethnicity  1                        BP  160-88 mmHg Reason For Study  Dyspnea on exertion  DOE                                           HR  76 Ordering Physician  Sinda Du      Performed By  Valda Favia Referring Physician  UDOM, Roger Shelter - - PROCEDURE A two-dimensional transthoracic echocardiogram with color flow and Doppler was performed. Image Quality  Fair. - SUMMARY Normal LV size, wall thickness, wall motion and systolic function with ejection fraction 70-75% The left ventricular diastolic function is probably normal for age, with likely normal left atrial pressure. The right ventricle is normal in size and function. The atria are normal in size. There is  no significant valvular stenosis or regurgitation. There was insufficient TR detected to calculate RV systolic pressure. The IVC is normal in size with an inspiratory collapse of greater than 50%, suggesting normal right atrial pressure. The aortic sinus is normal size. There is no significant change in comparison with the last study. - FINDINGS LEFT VENTRICLE Normal LV size, wall thickness, wall motion and systolic function with ejection fraction 70-75%. The left ventricle is hyperdynamic. The left ventricular diastolic function is probably normal for age, with likely normal left atrial pressure. There is mild, Grade I diastolic dysfunction, with indeterminate left atrial pressure. Left atrial pressure is indeterminate due to  E-e  > 8 and < 14 . - RIGHT VENTRICLE The right ventricle is normal in size and function. There is normal right ventricular wall thickness. LEFT ATRIUM The left atrial size is normal. The atria are normal in size. RIGHT ATRIUM Right atrial size is normal. There is no Doppler evidence for a patent foramen ovale. - AORTIC VALVE The aortic valve is trileaflet. There is no aortic stenosis. There is no aortic regurgitation. - MITRAL VALVE The mitral valve leaflets appear normal. There is no mitral regurgitation noted. - TRICUSPID VALVE Structurally normal tricuspid valve. No tricuspid regurgitation. There was insufficient TR detected to calculate RV systolic pressure. - PULMONIC VALVE The pulmonic valve is not well visualized. There is no pulmonic valvular stenosis. Trace pulmonic valvular regurgitation. - ARTERIES The aortic sinus is normal size. The ascending aorta is normal size. - VENOUS The IVC is normal in size with an inspiratory collapse of greater than 50%, suggesting normal right atrial pressure. - EFFUSION There is no pericardial effusion. There is no pleural effusion. - - MMode-2D Measurements & Calculations IVSd  1.1 cm               LA  dim  4.6 cm             ESV MOD-sp4   20.7 ml     asc Aorta Diam LVIDd  4.6 cm              EDV MOD-sp4   102.0 ml  3.5 cm LVPWd  1.0 cm LVIDs  2.9 cm           _________________________________________________________________________________ LVOT diam  1.9 cm          SV MOD-sp4   81.3 ml       EF A4C  79.7 %            LA ESV  BP                              SI MOD-sp4   39.1 ml-m2                              60.1 ml           _________________________________________________________________________________ LA ESV Index  A2C          LA ESV Index  A4C          LA ESV Index  BP          SV A4C  81.3 ml 30.5 ml-m2                 26.7 ml-m2                 28.9 ml-m2 Doppler Measurements & Calculations MV E max vel  74.9 cm-sec        MV dec time  0.33 sec     SV LVOT   71.6 ml       LV V1 VTI MV A max vel  120.0 cm-sec                                 Ao V2 max  187.0 cm-sec 26.3 cm MV E-A  0.62                                               Ao max PG  14.0 mmHg Med Peak E  Vel  6.7 cm-sec                                Ao V2 mean  123.0 cm-sec Lat Peak E  Vel  5.8 cm-sec                                Ao mean PG  7.0 mmHg E-Lat E`  13.0                                             Ao V2 VTI  36.8 cm E-Med E`  11.1                                                            AVA  VTI   1.9 cm2           _________________________________________________________________________________  AS Dimensionless Index  VTI      AVAi VTI  cm^2-m^2        SV index LVOT   0.71                             0.94 cm2                  34.5 ml-m2 ______________________________________________________________________________ Reading Physician                   MD Bertram Gala, MD, [phone] 09-17-2023 01 33 PM Procedure Note  Bertram Gala, MD - 09/17/2023 Formatting of this note might be different from the original.                                                   [facility]                                                  [facility]                                                 [facility]                                                   Cardiac                                                 Ultrasound-                                                 [place],                                                [facility]  Bldg                                                  [address]                                                 [place]                                                  [place] [address]                                  Transthoracic Echocardiogram Report Name  CHALA, GUL                            Study Date  09-17-2023              Height  64 in MRN  [id number]                                    Patient Location  [id number]          Weight  231 lb DOB  June 11, 1956                                  Gender  Female                      BSA  2.1 m2 Age  40 yrs                                      Ethnicity  1                        BP  160-88 mmHg Reason For Study  Dyspnea on exertion  DOE                                           HR  76 Ordering Physician  Sinda Du      Performed By  Valda Favia Referring Physician  UDOM, Roger Shelter - - PROCEDURE A two-dimensional transthoracic echocardiogram with color flow and Doppler was performed. Image Quality  Fair. - SUMMARY Normal LV size, wall thickness, wall motion and systolic function with ejection fraction 70-75% The left ventricular diastolic function is probably normal  for age, with likely normal left atrial pressure. The right ventricle is normal in size and function. The atria are normal in size. There is no significant valvular stenosis or  regurgitation. There was insufficient TR detected to calculate RV systolic pressure. The IVC is normal in size with an inspiratory collapse of greater than 50%, suggesting normal right atrial pressure. The aortic sinus is normal size. There is no significant change in comparison with the last study. - FINDINGS LEFT VENTRICLE Normal LV size, wall thickness, wall motion and systolic function with ejection fraction 70-75%. The left ventricle is hyperdynamic. The left ventricular diastolic function is probably normal for age, with likely normal left atrial pressure. There is mild, Grade I diastolic dysfunction, with indeterminate left atrial pressure. Left atrial pressure is indeterminate due to  E-e  > 8 and < 14 . - RIGHT VENTRICLE The right ventricle is normal in size and function. There is normal right ventricular wall thickness. LEFT ATRIUM The left atrial size is normal. The atria are normal in size. RIGHT ATRIUM Right atrial size is normal. There is no Doppler evidence for a patent foramen ovale. - AORTIC VALVE The aortic valve is trileaflet. There is no aortic stenosis. There is no aortic regurgitation. - MITRAL VALVE The mitral valve leaflets appear normal. There is no mitral regurgitation noted. - TRICUSPID VALVE Structurally normal tricuspid valve. No tricuspid regurgitation. There was insufficient TR detected to calculate RV systolic pressure. - PULMONIC VALVE The pulmonic valve is not well visualized. There is no pulmonic valvular stenosis. Trace pulmonic valvular regurgitation. - ARTERIES The aortic sinus is normal size. The ascending aorta is normal size. - VENOUS The IVC is normal in size with an inspiratory collapse of greater than 50%, suggesting normal right atrial pressure. - EFFUSION There is no pericardial effusion. There is no pleural effusion. - - MMode-2D Measurements & Calculations IVSd  1.1 cm               LA dim  4.6 cm             ESV MOD-sp4    20.7 ml     asc Aorta Diam LVIDd  4.6 cm              EDV MOD-sp4   102.0 ml                               3.5 cm LVPWd  1.0 cm LVIDs  2.9 cm _________________________________________________________________________________ LVOT diam  1.9 cm          SV MOD-sp4   81.3 ml       EF A4C  79.7 %            LA ESV  BP                              SI MOD-sp4   39.1 ml-m2                              60.1 ml _________________________________________________________________________________ LA ESV Index  A2C          LA ESV Index  A4C          LA ESV Index  BP          SV A4C  81.3 ml 30.5 ml-m2  26.7 ml-m2                 28.9 ml-m2 Doppler Measurements & Calculations MV E max vel  74.9 cm-sec        MV dec time  0.33 sec     SV LVOT   71.6 ml       LV V1 VTI MV A max vel  120.0 cm-sec                                 Ao V2 max  187.0 cm-sec 26.3 cm MV E-A  0.62                                               Ao max PG  14.0 mmHg Med Peak E  Vel  6.7 cm-sec                                Ao V2 mean  123.0 cm-sec Lat Peak E  Vel  5.8 cm-sec                                Ao mean PG  7.0 mmHg E-Lat E`  13.0                                             Ao V2 VTI  36.8 cm E-Med E`  11.1                                                            AVA  VTI   1.9 cm2 _________________________________________________________________________________ AS Dimensionless Index  VTI      AVAi VTI  cm^2-m^2        SV index LVOT   0.71                             0.94 cm2                  34.5 ml-m2 ______________________________________________________________________________ Reading Physician                   MD Bertram Gala, MD, [phone] 09-17-2023 01 33 PM Performed Procedures  TRANSTHORACIC ECHOCARDIOGRAM (TTE) COMPLETE Exam End: 09/17/23 11:53   Specimen Collected: 09/17/23 10:58 Last Resulted: 09/17/23 13:33  Received From: [facility]  Result Received: 10/17/23 08:49   View Encounter    Stress test with myocardial perfusion  Anatomical Region Laterality Modality  -- -- Nuclear Medicine   Narrative  This result has an attachment that is not available.   Myocardial perfusion imaging test is normal.  There is no evidence of inducible ischemia.  There is no evidence of old infarct.   Overall left ventricular systolic function was normal. The calculated ejection fraction was measured at 71%.   Low cardiovascular risk  Stress Findings The patient received an intravenous injection of 0.4 milligrams of regadenoson over ten to fifteen seconds. No adjunctive exercise was performed with this study. The baseline heart rate was 76 beats per minute, and one minute after the regadenoson injection the heart rate was measured at 101 beats per minute. The baseline blood pressure was 196/96 mmHg, and was197/101 mmHg one minute after the injection.The patient demonstrated a hypertensive blood pressure response during the stress test. The patient reported chest tightness, dyspnea and headache during the stress test. .The test was terminated due to completion of the protocol.  ECG The baseline ECG showed normal sinus rhythm. There were no diagnostic ST changes suggestive of ischemia noted during the stress. ECG test result was negative for evidence of ischemia. There was no chest pain during the exam.  Isotope Administration A 1-day rest/stress protocol was performed with regadenoson as the stress agent. The patient was an outpatient. The study was gated. The left ventricle ejection fraction was 71%. Resting myocardial perfusion imaging was performed 30 minutes after the intravenous injection of 10.3 mCi of sestambi. Post-stress tomographic imaging was performed 60 minutes after stress. The patient was injected intravenously with 33 mCi of sestamibi. The injection site used was the left forearm.  Perfusion Comments The overall quality of the study is good. The left ventricular cavity is noted  to be normal.  Study Impression Myocardial perfusion imaging test is normal.  Patient Status Clinical Presentation: chest pain and dyspnea on exertion Patient history: hypertension  Gated Data Overall left ventricular systolic function was normal. The calculated ejection fraction was measured at 71%. Gated SPECT imaging demonstrated normal wall motion.  B[place]seye Score The Summed Stress Score (SSS) was measured at 7. The Summed Rest Score (SRS) was 6. 1 was the difference, or Summed Difference Score (SDS).The stress defect size was 10.3% with an ischemic zone measurement of 1.5%.  PCI risk Nuc reccomendations Low cardiovascular risk All Measurements  Performed Procedures  STRESS TEST, REGADENOSON W MYOCARDIAL PERFUSION SPECT (MULTI STUDY) Exam End: 03/19/23 11:53 Last Resulted: 03/19/23 17:36  Received From: [facility]  Result Received: 05/09/23 15:27   View Encounter   ECG 12 lead  Narrative Performed by [facility] This result has an attachment that is not available. Ventricular Rate                   80        BPM                 Atrial Rate                        80        BPM                 P-R Interval                       140       ms                   QRS Duration                       82        ms                   Q-T Interval  350       ms                   QTC Calculation Bazett             403       ms                   Calculated P Axis                  76        degrees             Calculated R Axis                  13        degrees             Calculated T Axis                  68        degrees              Sinus rhythm with marked sinus arrhythmia Otherwise normal ECG When compared with ECG of 31-May-2022 14 29, No significant change was found Confirmed by Therisa Doyne  26  on 04-26-2023 2 45 31 PM Procedure Note  Loney Loh, MD - 04/26/2023 Formatting of this note might be different from the original. Ventricular  Rate                   80        BPM                 Atrial Rate                        80        BPM                 P-R Interval                       140       ms                   QRS Duration                       82        ms                   Q-T Interval                       350       ms                   QTC Calculation Bazett             403       ms                   Calculated P Axis                  76        degrees             Calculated R Axis                  13  degrees             Calculated T Axis                  68        degrees              Sinus rhythm with marked sinus arrhythmia Otherwise normal ECG When compared with ECG of 31-May-2022 14 29, No significant change was found Confirmed by Therisa Doyne  26  on 04-26-2023 2 45 31 PM Specimen Collected: 04/23/23 10:02   Performed by: Nada Maclachlan [facility] Last Resulted: 04/26/23 14:45  Received From: [facility]  Result Received: 05/09/23 15:27   View Encounter   MG MAMMOGRAM SCREENING BILATERAL W TOMO  Narrative Performed by Lerry Liner [facility] This is a summary report. The complete report is available in the patient's medical record. If you cannot access the medical record, please contact the sending organization for a detailed fax or copy.  CLINICAL DATA:  Screening.  EXAM: DIGITAL SCREENING BILATERAL MAMMOGRAM WITH TOMOSYNTHESIS AND CAD  TECHNIQUE: Bilateral screening digital craniocaudal and mediolateral oblique mammograms were obtained. Bilateral screening digital breast tomosynthesis was performed. The images were evaluated with computer-aided detection.  COMPARISON:  Previous exam(s).  ACR Breast Density Category b: There are scattered areas of fibroglandular density.  FINDINGS: There are no findings suspicious for malignancy.  IMPRESSION: No mammographic evidence of malignancy. A result letter of this screening mammogram will be mailed directly to the  patient.  RECOMMENDATION: Screening mammogram in one year. (Code:SM-B-01Y)  BI-RADS CATEGORY  1: Negative.   Electronically Signed   By: Gerome Sam III M.D.   On: 09/07/2022 08:17 Procedure Note  Morey Hummingbird, MD - 12/04/2022 Formatting of this note might be different from the original. This is a summary report. The complete report is available in the patient's medical record. If you cannot access the medical record, please contact the sending organization for a detailed fax or copy.  CLINICAL DATA:  Screening.  EXAM: DIGITAL SCREENING BILATERAL MAMMOGRAM WITH TOMOSYNTHESIS AND CAD  TECHNIQUE: Bilateral screening digital craniocaudal and mediolateral oblique mammograms were obtained. Bilateral screening digital breast tomosynthesis was performed. The images were evaluated with computer-aided detection.  COMPARISON:  Previous exam(s).  ACR Breast Density Category b: There are scattered areas of fibroglandular density.  FINDINGS: There are no findings suspicious for malignancy.  IMPRESSION: No mammographic evidence of malignancy. A result letter of this screening mammogram will be mailed directly to the patient.  RECOMMENDATION: Screening mammogram in one year. (Code:SM-B-01Y)  BI-RADS CATEGORY  1: Negative.   Electronically Signed   By: Gerome Sam III M.D.   On: 09/07/2022 08:17 Specimen Collected: --   Performed by: Lerry Liner [facility] Last Resulted: 09/07/22 08:17  Received From: [facility]  Result Received: 12/05/22 21:17      Troponin, High Sensitive (0 Hr + 2 Hr Rfx) Specimen: Blood - Venous structure (body structure) Component Ref Range & Units 1 mo ago Comments  Troponin, High Sensitive <15 ng/L 5 >= 15 ng/L INDICATES MYOCARDIAL DAMAGE.THE DIAGNOSIS OF MYOCARDIAL INFARCTION REQUIRES CLINICAL CORRELATION.  Elevated troponin may also be due to myocardial stress from a variety of causes.  Alkaline Phos (ALP) levels >400 U/L  may cause falsely elevated results. Troponin test is invalid in patients taking asfotase alpha.  This troponin assay was not validated for evaluation of troponin in patients younger than 21 years. There are no ranges established for patients younger than 21 years.  Therefore, laboratory results for these patients should be interpreted with caution.  Resulting Agency [facility] [phone])   Specimen Collected: 12/07/23 16:39   Performed by: [facility] (XBJY#[id number]) Last Resulted: 12/07/23 17:35  Received From: [facility]  Result Received: 12/13/23 10:56   View Encounter   Recent Data from [facility] Related to Troponin, High Sensitive (0 Hr + 2 Hr Rfx) 12/07/23 12/07/23 Component  5  5  Troponin, High Sensitive    Contains abnormal data Comprehensive Metabolic Panel Specimen: Blood - Venous structure (body structure) Component Ref Range & Units 1 mo ago Comments  Sodium 136 - 145 mmol/L 140   Potassium 3.5 - 5.1 mmol/L 4.2 NO VISIBLE HEMOLYSIS  Chloride 98 - 107 mmol/L 106   CO2 21 - 31 mmol/L 23   Anion Gap 6 - 14 mmol/L 11   Glucose, Random 70 - 99 mg/dL 865 High    Blood Urea Nitrogen (BUN) 7 - 25 mg/dL 16   Creatinine 7.84 - 1.20 mg/dL 6.96 Low    eGFR >29 BM/WUX/3.24M0 >90 GFR estimated by CKD-EPI equations(NKF 2021).  "Recommend confirmation of Cr-based eGFR by using Cys-based eGFR and other filtration markers (if applicable) in complex cases and clinical decision-making, as needed."  Albumin 3.5 - 5.7 g/dL 4.9   Total Protein 6.4 - 8.9 g/dL 7.2   Bilirubin, Total 0.3 - 1.0 mg/dL 0.5   Alkaline Phosphatase (ALP) 34 - 104 U/L 101   Aspartate Aminotransferase (AST) 13 - 39 U/L 21   Alanine Aminotransferase (ALT) 7 - 52 U/L 40   Calcium 8.6 - 10.3 mg/dL 9.6   BUN/Creatinine Ratio 10.0 - 20.0 27.1 High  Potential prerenal damage (e.g. CHF, GI Bleeding)  Resulting  Agency [facility] [place] [facility](CLIA# [id number])   Specimen Collected: 12/07/23 10:05   Performed by: Nada Maclachlan [place] [facility](CLIA# [id number]) Last Resulted: 12/07/23 13:33  Received From: [facility]  Result Received: 12/27/23 12:48   View Encounter   Recent Data from [facility] Related to Comprehensive Metabolic Panel 01/25/23 02/16/23 12/07/23 Component  139 138 140 Sodium  4.3  4.1  4.2  Potassium  105 105 106 Chloride  23 23 23  CO2  11 10 11  Anion Gap  121 High  90 144 High  Glucose, Random  16 17 16  Blood Urea Nitrogen (BUN)  0.67 0.63 0.59 Low  Creatinine  >90  >90  >90  eGFR  -- 4.3 4.9 Albumin  -- 6.6 7.2 Total Protein  -- 0.6 0.5 Bilirubin, Total  -- 85 101 Alkaline Phosphatase (ALP)  -- 16 21 Aspartate Aminotransferase (AST)  -- 25 40 Alanine Aminotransferase (ALT)  9.4 8.9 9.6 Calcium  --  --  27.1 High   BUN/Creatinine Ratio   Troponin, High Sensitive Specimen: Blood - Venous structure (body structure) Component Ref Range & Units 1 mo ago Comments  Troponin, High Sensitive <15 ng/L 5 >= 15 ng/L INDICATES MYOCARDIAL DAMAGE.THE DIAGNOSIS OF MYOCARDIAL INFARCTION REQUIRES CLINICAL CORRELATION.  Elevated troponin may also be due to myocardial stress from a variety of causes.  Alkaline Phos (ALP) levels >400 U/L may cause falsely elevated results. Troponin test is invalid in patients taking asfotase alpha.  This troponin assay was not validated for evaluation of troponin in patients younger than 21 years. There are no ranges established for patients younger than 21 years. Therefore, laboratory results for these patients should be interpreted with caution.  Resulting  Agency [facility] [place] [facility](CLIA# [id number])   Specimen Collected: 12/07/23 10:05   Performed by: [facility] [place] [facility](CLIA# [id number]) Last Resulted: 12/07/23 13:39  Received From: [facility]  Result Received: 12/13/23 10:56    View Encounter   Recent Data from [facility] Related to Troponin, High Sensitive 12/07/23 12/07/23 Component  5  5  Troponin, High Sensitive    Contains abnormal data CBC with Differential Specimen: Blood - Venous structure (body structure) Component Ref Range & Units 1 mo ago  WBC 4.40 - 11.00 10*3/uL 8.7  RBC 4.10 - 5.10 10*6/uL 5.51 High   Hemoglobin 12.3 - 15.3 g/dL 24.4 High   Hematocrit 35.9 - 44.6 % 46.3 High   Mean Corpuscular Volume (MCV) 80.0 - 96.0 fL 83.9  Mean Corpuscular Hemoglobin (MCH) 27.5 - 33.2 pg 28.3  Mean Corpuscular Hemoglobin Conc (MCHC) 33.0 - 37.0 g/dL 01.0  Red Cell Distribution Width (RDW) 12.3 - 17.0 % 13.5  Platelet Count (PLT) 150 - 450 10*3/uL 246  Mean Platelet Volume (MPV) 6.8 - 10.2 fL 8.6  Neutrophils % % 72  Lymphocytes % % 18  Monocytes % % 10  Eosinophils % % 0  Basophils % % 1  Neutrophil Absolute (Man Diff) 1.80 - 7.80 10*3/uL 6.3  Lymphocytes Absolute (Man Diff) 1.00 - 4.80 10*3/uL 1.6  Monocytes Absolute (Man Diff) 0.00 - 0.80 10*3/uL 0.9 High   Eosinophils Absolute (Man Diff) 0.00 - 0.50 10*3/uL 0  Basophils Absolute (Man Diff) 0.00 - 0.20 10*3/uL 0.1  RBC & PLT Morphology Reviewed  Ovalocytes 1+  Poikilocytes 2+  Resulting Agency [facility] [place] [facility](CLIA# [id number])  Specimen Collected: 12/07/23 10:05   Performed by: Nada Maclachlan [place] [facility](CLIA# [id number]) Last Resulted: 12/07/23 14:27  Received From: [facility]  Result Received: 12/27/23 12:48   View Encounter   Recent Data from [facility] Related to CBC with Differential 02/16/23 12/07/23 Component  6.80 8.7 WBC  4.93 5.51 High  RBC  13.9 15.6 High  Hemoglobin  42.0 46.3 High  Hematocrit  85.2 83.9 Mean Corpuscular Volume (MCV)  28.1 28.3 Mean Corpuscular Hemoglobin (MCH)  33.0 33.7 Mean Corpuscular Hemoglobin Conc (MCHC)  13.3 13.5 Red Cell Distribution Width (RDW)  273 246 Platelet Count (PLT)  8.1  8.6 Mean Platelet Volume (MPV)  66 72 Neutrophils %  22 18 Lymphocytes %  10 10 Monocytes %  1 0 Eosinophils %  1 1 Basophils %  -- 6.3 Neutrophil Absolute (Man Diff)  -- 1.6 Lymphocytes Absolute (Man Diff)  -- 0.9 High  Monocytes Absolute (Man Diff)  -- 0 Eosinophils Absolute (Man Diff)  -- 0.1 Basophils Absolute (Man Diff)  -- Reviewed RBC & PLT Morphology  -- 1+ Ovalocytes  -- 2+ Poikilocytes  0.10 -- Basophils #  0.10 -- Eosinophils #  1.50 -- Lymphocytes #  0.60 -- Monocytes #  4.50 -- Neutrophils Absolute  0 -- nRBC %  0.00 -- nRBC Absolute   B-Type Natriuretic Peptide (BNP) Specimen: Blood - Venous structure (body structure) Component Ref Range & Units 1 mo ago  B-Type Natriuretic Peptide (BNP) <100 pg/mL <50  Resulting Agency [facility] [place] [facility](CLIA# [id number])  Specimen Collected: 12/07/23 10:05   Performed by: Michel [place] [facility](CLIA# [id number]) Last Resulted: 12/07/23 13:40  Received From: [facility]  Result Received: 12/13/23 10:56   View Encounter   Recent Data from [facility] Related to  B-Type Natriuretic Peptide (BNP) 06/02/22 12/07/23 Component  -- <50 B-Type Natriuretic Peptide (BNP)  <50 -- HX B-TYPE NATRIURETIC PEPTIDE    Contains abnormal data D-Dimer, Quantitative Specimen: Blood - Venous structure (body structure) Component Ref Range & Units 1 mo ago Comments  D-Dimer 190.00 - 500.00 NG/ML FEU 990 High  FDA APPROVED CUTOFF FOR VTE (to include DVT and PE) EXCLUSION BY THIS METHOD IS <500 NG/ML FEU  Resulting Agency AH [place] [facility](CLIA# [id number])   Specimen Collected: 12/07/23 10:05   Performed by: Nada Maclachlan [place] [facility](CLIA# [id number]) Last Resulted: 12/07/23 13:32  Received From: [facility]  Result Received: 12/13/23 10:56   View Encounter   Vitamin D, 25-Hydroxy Specimen: Blood - Venous structure (body structure) Component Ref Range & Units 10  mo ago Comments  Vitamin D 25-Hydroxy 30.0 - 100.0 ng/mL 32.8 Deficient: <20 ng/mL Insufficient: 20-30 ng/mL Sufficient: 30-100 ng/mL Upper Safety Limit/Toxicity: >100 ng/mL  Resulting Agency AH [place] [facility](CLIA# [id number])   Specimen Collected: 02/16/23 08:18   Performed by: Nada Maclachlan [place] [facility](CLIA# [id number]) Last Resulted: 02/16/23 11:02  Received From: [facility]  Result Received: 05/09/23 15:27   View Encounter   Recent Data from [facility] Related to Vitamin D, 25-Hydroxy 12/07/20 04/07/21 02/04/22 08/14/22 02/16/23 Component  -- -- -- -- 32.8  Vitamin D 25-Hydroxy  26 Low   35  28 Low   34  -- HX TOTAL VITAMIN D 25-HYDROXY   Lipid Panel Specimen: Blood - Venous structure (body structure) Component Ref Range & Units 10 mo ago Comments  Cholesterol, Total, Lipid Panel <200 mg/dL 284 NCEP III Guidelines  Total Cholesterol             Risk Classification                         <200 mg/dL                      Desirable   200-239 mg/dL                   Borderline High   >240 mg/dL                      High  Triglycerides, Lipid Panel <150 mg/dL 97 NCEP-ATP III Guidelines   Triglyceride Value           Risk Classification                         <150 mg/dL                   Normal   150-199 mg/dL                Borderline High   200-499 mg/dL                High   >=132 mg/dL                  Very High  HDL Cholesterol - Lipid Panel >=60 mg/dL 68 NCEP III Guidelines   HDLc                     Risk Classification                         >=  60 mg/dL                  Optimal   >=40 mg/dL                  Desirable   <40 mg/dL                   Low    LDL Cholesterol, Calculated <100 mg/dL 94 LDL Cholesterol is calculated using the Martin/Hopkins equation. Source:  JAMA 2013; 310:  2061-68. NCEP-ATP III Guidelines   LDLc                     Risk Classification                         <100 mg/dL                    Optimal   100-129 mg/dL                Desirable   130-159 mg/dL                Borderline High   160-189 mg/dL                High   >409 mg/dL                   Very High  Non-HDL Cholesterol mg/dL 811   Resulting Agency [facility] [place] [facility](CLIA# [id number])   Specimen Collected: 02/16/23 08:18   Performed by: Nada Maclachlan [place] [facility](CLIA# [id number]) Last Resulted: 02/16/23 10:38  Received From: [facility]  Result Received: 05/09/23 15:27   View Encounter   Recent Data from [facility] Related to Lipid Panel 04/07/21 04/22/21 08/08/21 02/04/22 08/14/22 02/16/23 Component  -- -- -- -- -- 181  Cholesterol, Total, Lipid Panel  -- -- -- -- -- 97  Triglycerides, Lipid Panel  -- -- -- -- -- 68  HDL Cholesterol - Lipid Panel  -- -- -- -- -- 94  LDL Cholesterol, Calculated  -- -- -- -- -- 113 Non-HDL Cholesterol  125 188 200 High  204 High  184 -- HX CHOLESTEROL  --  --  --  --  --  -- HX CORONARY HEART DISEASE RISK TABLE  53 79 71 66 70 -- HX HDL CHOLESTEROL  57 99 116 117 94 -- HX LDL CHOLESTEROL DIRECT  72  109  129  138  114  -- HX NON HDL CHOLESTEROL  2.4 2.4 2.8 3.1 2.6 -- HX TOTAL CHOL/HDL CHOLESTEROL  77 83 111 124 106 -- HX TRIGLYCERIDES  View all related data    Contains abnormal data Urinalysis with Reflex to Microscopic Specimen: Urine - Urine specimen obtained by clean catch procedure (specimen) Component Ref Range & Units 10 mo ago  Color, Urine Yellow Colorless  Clarity, Urine Clear Clear  Specific Gravity, Urine 1.005 - 1.025 1.004 Low   pH, Urine 5.0 - 8.0 6.5  Protein, Urine Negative mg/dL Negative  Glucose, Urine Negative mg/dL Negative  Ketones, Urine Negative mg/dL Negative  Bilirubin, Urine Negative Negative  Blood, Urine Negative Negative  Nitrite, Urine Negative Negative  Leukocyte Esterase, Urine Negative Negative  Urobilinogen, Urine <2.0 mg/dL Normal  Resulting Agency [facility] [place] [facility](CLIA# [id number])  Narrative Performed by [facility](CLIA# [id number]) Microscopic not indicated Specimen Collected: 02/16/23  08:18   Performed by: Michel [place] [facility](CLIA# [id number]) Last Resulted: 02/16/23 10:29  Received From: [facility]  Result Received: 12/13/23 10:56   View Encounter   Recent Data from [facility] Related to Urinalysis with Reflex to Microscopic 01/20/21 02/16/23 Component  -- Colorless Color, Urine  -- Clear Clarity, Urine  -- 1.004 Low  Specific Gravity, Urine  -- 6.5 pH, Urine  -- Negative Protein, Urine  -- Negative Glucose, Urine  -- Negative Ketones, Urine  Negative Negative Bilirubin, Urine  Negative Negative Blood, Urine  -- Negative Nitrite, Urine  Negative Negative Leukocyte Esterase, Urine  -- Normal Urobilinogen, Urine   Comprehensive Metabolic Panel Specimen: Blood - Venous structure (body structure) Component Ref Range & Units 10 mo ago Comments  Sodium 136 - 145 mmol/L 138   Potassium 3.5 - 5.1 mmol/L 4.1 NO VISIBLE HEMOLYSIS  Chloride 98 - 107 mmol/L 105   CO2 21 - 31 mmol/L 23   Anion Gap 6 - 14 mmol/L 10   Glucose, Random 70 - 99 mg/dL 90   Blood Urea Nitrogen (BUN) 7 - 25 mg/dL 17   Creatinine 8.65 - 1.20 mg/dL 7.84   eGFR >69 GE/XBM/8.41L2 >90 GFR estimated by CKD-EPI equations(NKF 2021).  "Recommend confirmation of Cr-based eGFR by using Cys-based eGFR and other filtration markers (if applicable) in complex cases and clinical decision-making, as needed."  Albumin 3.5 - 5.7 g/dL 4.3   Total Protein 6.4 - 8.9 g/dL 6.6   Bilirubin, Total 0.3 - 1.0 mg/dL 0.6   Alkaline Phosphatase (ALP) 34 - 104 U/L 85   Aspartate Aminotransferase (AST) 13 - 39 U/L 16   Alanine Aminotransferase (ALT) 7 - 52 U/L 25   Calcium 8.6 - 10.3 mg/dL 8.9   BUN/Creatinine Ratio  Creatinine is normal, ratio is not clinically indicated.  Resulting Agency [facility]  [place] [facility](CLIA# [id number])   Specimen Collected: 02/16/23 08:18   Performed by: Nada Maclachlan [facility](CLIA# [id number]) Last Resulted: 02/16/23 10:38  Received From: [facility]  Result Received: 05/09/23 15:27   View Encounter   Recent Data from [facility] Related to Comprehensive Metabolic Panel 01/25/23 02/16/23 12/07/23 Component  139 138 140 Sodium  4.3  4.1  4.2  Potassium  105 105 106 Chloride  23 23 23  CO2  11 10 11  Anion Gap  121 High  90 144 High  Glucose, Random  16 17 16  Blood Urea Nitrogen (BUN)  0.67 0.63 0.59 Low  Creatinine  >90  >90  >90  eGFR  -- 4.3 4.9 Albumin  -- 6.6 7.2 Total Protein  -- 0.6 0.5 Bilirubin, Total  -- 85 101 Alkaline Phosphatase (ALP)  -- 16 21 Aspartate Aminotransferase (AST)  -- 25 40 Alanine Aminotransferase (ALT)  9.4 8.9 9.6 Calcium  --  --  27.1 High   BUN/Creatinine Ratio       Garner Nash, MD, MS

## 2024-01-14 ENCOUNTER — Ambulatory Visit: Payer: Medicare Other | Admitting: Family Medicine

## 2024-01-22 ENCOUNTER — Telehealth: Payer: Self-pay | Admitting: Pulmonary Disease

## 2024-01-22 NOTE — Therapy (Incomplete)
 OUTPATIENT PHYSICAL THERAPY CERVICAL EVALUATION   Patient Name: Kirsten Choi MRN: [id number] DOB:1956/08/26, 68 y.o., female Today's Date: 01/23/2024  END OF SESSION:  PT End of Session - 01/23/24 0802     Visit Number 1    Date for PT Re-Evaluation 04/02/24    Authorization Type Medicare    PT Start Time 0800    PT Stop Time 0845    PT Time Calculation (min) 45 min    Activity Tolerance Patient tolerated treatment well    Behavior During Therapy WFL for tasks assessed/performed             Past Medical History:  Diagnosis Date   Arthritis    Cancer (HCC)    breast CA (resolved)   Hypertension    Migraine    Past Surgical History:  Procedure Laterality Date   BREAST SURGERY     COSMETIC SURGERY     HERNIA REPAIR     REPLACEMENT TOTAL KNEE     WRIST SURGERY     Patient Active Problem List   Diagnosis Date Noted   Class 2 severe obesity due to excess calories with serious comorbidity and body mass index (BMI) of 39.0 to 39.9 in adult (HCC) 01/10/2024   Vitamin D deficiency 01/10/2024   Thoracic aortic atherosclerosis (HCC) 01/10/2024   History of Helicobacter pylori infection 01/10/2024   Acute anterior lower chest wall pain 01/10/2024   Chronic gastric ulcer without hemorrhage and without perforation 01/10/2024   S/P TKR (total knee replacement), left 01/10/2024   History of bilateral breast cancer 01/10/2024   Metabolic dysfunction-associated steatotic liver disease (MASLD) 01/10/2024   Cervical radiculopathy due to degenerative joint disease of spine 01/10/2024   Antiphospholipid syndrome (HCC) 01/10/2024   OSA (obstructive sleep apnea) 06/15/2022   Dyspnea 06/15/2022   Paroxysmal SVT (supraventricular tachycardia) (HCC) 01/20/2020   Hypertension    Bradycardia     PCP: Fanny Bien  REFERRING PROVIDER: Fanny Bien  REFERRING DIAG:  [phone] (ICD-10-CM) - Cervical radiculopathy due to degenerative joint disease of spine    THERAPY DIAG:   Radiculopathy, cervical region  Pain in left arm  Rationale for Evaluation and Treatment: Rehabilitation  ONSET DATE: since Jan  SUBJECTIVE:                                                                                                                                                                                                         SUBJECTIVE STATEMENT: They tell me I probably have a pinched nerve because I get pain radiating down my arm. My  index finger, middle finger, and sometimes my thumb are numb. The index one is always numb. I have been going to a chiropractor and it has been helping where the pain is not a 10 anymore. He does some adjustment with a hammer and do traction. I go 3x a week, and I have been going for 6 weeks. I feel tightness and pain in the L shoulder blade. I have a nerve conduction study next week.   Hand dominance: Right  PERTINENT HISTORY:  Cervical radiculopathy with degenerative disease--Chronic left-sided cervical radiculopathy with significant discomfort radiating from the shoulder to the fingers, associated with numbness HTN, SVT, Antiphospholipid syndrome, MASLD, class 2 obesity, sleep apnea, hx of bilateral breast cancer, hx of L TKA  PAIN:  Are you having pain? Yes: NPRS scale: 5/10 Pain location: L shoulder blade and down my arm  Pain description: numbness/tingling, dull, throbbing  Aggravating factors: I don't know but it is worse in the morning Relieving factors: sometimes when I stretch my hands up it might be better  PRECAUTIONS: None  RED FLAGS: None     WEIGHT BEARING RESTRICTIONS: No  FALLS:  Has patient fallen in last 6 months? Yes. Number of falls 1, I stumble and trip because I am clumsy   LIVING ENVIRONMENT: Lives with: lives alone Lives in: House/apartment Stairs: Yes: External: 2 steps; avoid by going in through the garage  OCCUPATION: retired  PLOF: Independent  PATIENT GOALS: I want my numbness and pain gone and  drive without numbness and pain  NEXT MD VISIT: I do but I don't remember when it is   OBJECTIVE:  Note: Objective measures were completed at Evaluation unless otherwise noted.  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   COGNITION: Overall cognitive status: Within functional limits for tasks assessed  SENSATION: WFL  POSTURE: rounded shoulders, forward head, and increased thoracic kyphosis  PALPATION: Tightness in upper traps, some trigger points    CERVICAL ROM:   Active ROM A/PROM (deg) eval  Flexion WFL  Extension WFL "just tight"  Right lateral flexion 75%  Left lateral flexion 75%  Right rotation WFL  Left rotation 75%    (Blank rows = not tested)  UPPER EXTREMITY ROM: WFL   UPPER EXTREMITY MMT: overall 4/5 except abduction on both sides is weak 3+    CERVICAL SPECIAL TESTS:  Upper limb tension test (ULTT): Positive, Spurling's test: Positive, and Distraction test: Positive   TREATMENT DATE: 01/23/24- EVAL                                                                                                                                 PATIENT EDUCATION:  Education details: POC, HEP Person educated: Patient Education method: Explanation Education comprehension: verbalized understanding  HOME EXERCISE PROGRAM: Access Code: [email or web address] URL: [email or web address] Date: 01/23/2024 Prepared by: Cassie Freer  Exercises - Seated Upper Trapezius  Stretch  - 2 x daily - 7 x weekly - 2 reps - 15 hold - Seated Levator Scapulae Stretch  - 2 x daily - 7 x weekly - 2 reps - 15 hold - Median Nerve Flossing - Tray  - 2 x daily - 7 x weekly - 2 sets - 10 reps - Doorway Pec Stretch at 90 Degrees Abduction  - 2 x daily - 7 x weekly - 2 reps - 15 hold  ASSESSMENT:  CLINICAL IMPRESSION: Patient is a 68 y.o. female who was seen today for physical therapy  evaluation and treatment for cervical radiculopathy with degenerative disease. Her symptoms started in January and has gotten progressively worse. She has discomfort radiating from the L shoulder blade to the first three fingers, associated with numbness and tingling. MRI revealed significant right-sided neuroforaminal stenosis at C5-C6 and multiple degenerative changes throughout the spine. She has been going to a chiro for 6 weeks now, 3x a week for adjustments and traction but has had no real changes. Patient presents with positive Spurling's and Distraction test. She also has some postural deficits and increased tightness in her upper traps. Patient will benefit from skilled PT to address her pain and numbness and tingling to be able to complete ADLs without discomfort.   OBJECTIVE IMPAIRMENTS: decreased ROM, decreased strength, postural dysfunction, and pain.   PERSONAL FACTORS: Age, Behavior pattern, and Time since onset of injury/illness/exacerbation are also affecting patient's functional outcome.   REHAB POTENTIAL: Good  CLINICAL DECISION MAKING: Stable/uncomplicated  EVALUATION COMPLEXITY: Low  GOALS: Goals reviewed with patient? Yes  SHORT TERM GOALS: Target date: 02/27/24  Patient will be independent with initial HEP.  Baseline: given 01/23/24 Goal status: INITIAL  LONG TERM GOALS: Target date:04/02/24  Patient will be independent with advanced/ongoing HEP to improve outcomes and carryover.  Baseline:  Goal status: INITIAL  2.  Patient will report 75% improvement in L shoulder and arm pain to improve QOL.  Baseline: 5/10 Goal status: INITIAL  3.  Patient will demonstrate full pain free cervical ROM an no pain with driving.  Baseline:  Goal status: INITIAL  4.  Patient will report decreased numbness and  tingling into her hand by 50% Baseline:  Goal status: INITIAL  5.  Patient will report decreased tightness in upper traps and cervical/neck and improved  posture Baseline: tightness in UT and rounded shoulders Goal status: INITIAL   PLAN:  PT FREQUENCY: 2x/week  PT DURATION: 10 weeks  PLANNED INTERVENTIONS: 97110-Therapeutic exercises, 97530- Therapeutic activity, 97112- Neuromuscular re-education, 97535- Self Care, [id number]- Manual therapy, G0283- Electrical stimulation (unattended), 97012- Traction (mechanical), Patient/Family education, Taping, Dry Needling, Joint mobilization, Spinal mobilization, Cryotherapy, and Moist heat  PLAN FOR NEXT SESSION: nerve glides, STM and manual traction or with machine, postural exercises   Cassie Freer, PT 01/23/2024, 8:46 AM

## 2024-01-23 ENCOUNTER — Ambulatory Visit: Attending: Family Medicine

## 2024-01-23 DIAGNOSIS — M79602 Pain in left arm: Secondary | ICD-10-CM | POA: Diagnosis present

## 2024-01-23 DIAGNOSIS — M5412 Radiculopathy, cervical region: Secondary | ICD-10-CM | POA: Diagnosis present

## 2024-01-23 DIAGNOSIS — M4722 Other spondylosis with radiculopathy, cervical region: Secondary | ICD-10-CM | POA: Diagnosis not present

## 2024-01-24 NOTE — Telephone Encounter (Signed)
 Fax confiramtion received 01/24/24

## 2024-01-29 ENCOUNTER — Ambulatory Visit

## 2024-01-29 DIAGNOSIS — M5412 Radiculopathy, cervical region: Secondary | ICD-10-CM

## 2024-01-29 DIAGNOSIS — M79602 Pain in left arm: Secondary | ICD-10-CM

## 2024-01-29 NOTE — Therapy (Signed)
 OUTPATIENT PHYSICAL THERAPY CERVICAL TREATMENT   Patient Name: Kirsten Choi MRN: [id number] DOB:1956-10-10, 68 y.o., female Today's Date: 01/29/2024  END OF SESSION:  PT End of Session - 01/29/24 1756     Visit Number 2    Date for PT Re-Evaluation 04/02/24    Authorization Type Medicare    PT Start Time 1800    PT Stop Time 1845    PT Time Calculation (min) 45 min    Activity Tolerance Patient tolerated treatment well    Behavior During Therapy WFL for tasks assessed/performed              Past Medical History:  Diagnosis Date   Arthritis    Cancer (HCC)    breast CA (resolved)   Hypertension    Migraine    Past Surgical History:  Procedure Laterality Date   BREAST SURGERY     COSMETIC SURGERY     HERNIA REPAIR     REPLACEMENT TOTAL KNEE     WRIST SURGERY     Patient Active Problem List   Diagnosis Date Noted   Class 2 severe obesity due to excess calories with serious comorbidity and body mass index (BMI) of 39.0 to 39.9 in adult (HCC) 01/10/2024   Vitamin D deficiency 01/10/2024   Thoracic aortic atherosclerosis (HCC) 01/10/2024   History of Helicobacter pylori infection 01/10/2024   Acute anterior lower chest wall pain 01/10/2024   Chronic gastric ulcer without hemorrhage and without perforation 01/10/2024   S/P TKR (total knee replacement), left 01/10/2024   History of bilateral breast cancer 01/10/2024   Metabolic dysfunction-associated steatotic liver disease (MASLD) 01/10/2024   Cervical radiculopathy due to degenerative joint disease of spine 01/10/2024   Antiphospholipid syndrome (HCC) 01/10/2024   OSA (obstructive sleep apnea) 06/15/2022   Dyspnea 06/15/2022   Paroxysmal SVT (supraventricular tachycardia) (HCC) 01/20/2020   Hypertension    Bradycardia     PCP: Fanny Bien  REFERRING PROVIDER: Fanny Bien  REFERRING DIAG:  [phone] (ICD-10-CM) - Cervical radiculopathy due to degenerative joint disease of spine    THERAPY DIAG:   Radiculopathy, cervical region  Pain in left arm  Rationale for Evaluation and Treatment: Rehabilitation  ONSET DATE: since Jan  SUBJECTIVE:                                                                                                                                                                                                         SUBJECTIVE STATEMENT: I am going back to exercise class, if I start to feel it I stop and do  the nerve glides. It seems to release what is catching. N/T has not gone away but might be less frequent.   Hand dominance: Right  PERTINENT HISTORY:  Cervical radiculopathy with degenerative disease--Chronic left-sided cervical radiculopathy with significant discomfort radiating from the shoulder to the fingers, associated with numbness HTN, SVT, Antiphospholipid syndrome, MASLD, class 2 obesity, sleep apnea, hx of bilateral breast cancer, hx of L TKA  PAIN:  Are you having pain? Yes: NPRS scale: 5/10 Pain location: L shoulder blade and down my arm  Pain description: numbness/tingling, dull, throbbing  Aggravating factors: I don't know but it is worse in the morning Relieving factors: sometimes when I stretch my hands up it might be better  PRECAUTIONS: None  RED FLAGS: None     WEIGHT BEARING RESTRICTIONS: No  FALLS:  Has patient fallen in last 6 months? Yes. Number of falls 1, I stumble and trip because I am clumsy   LIVING ENVIRONMENT: Lives with: lives alone Lives in: House/apartment Stairs: Yes: External: 2 steps; avoid by going in through the garage  OCCUPATION: retired  PLOF: Independent  PATIENT GOALS: I want my numbness and pain gone and drive without numbness and pain  NEXT MD VISIT: I do but I don't remember when it is   OBJECTIVE:  Note: Objective measures were completed at Evaluation unless otherwise noted.  DIAGNOSTIC FINDINGS:  MRI revealed significant right-sided neuroforaminal stenosis at C5-C6 and multiple  degenerative changes throughout the spine.   COGNITION: Overall cognitive status: Within functional limits for tasks assessed  SENSATION: WFL  POSTURE: rounded shoulders, forward head, and increased thoracic kyphosis  PALPATION: Tightness in upper traps, some trigger points    CERVICAL ROM:   Active ROM A/PROM (deg) eval  Flexion WFL  Extension WFL "just tight"  Right lateral flexion 75%  Left lateral flexion 75%  Right rotation WFL  Left rotation 75%    (Blank rows = not tested)  UPPER EXTREMITY ROM: WFL   UPPER EXTREMITY MMT: overall 4/5 except abduction on both sides is weak 3+    CERVICAL SPECIAL TESTS:  Upper limb tension test (ULTT): Positive, Spurling's test: Positive, and Distraction test: Positive   TREATMENT DATE:  01/29/24 UBE L2 x42mins each way STM, c-spine paraspinal stretching, passive ROM  Cervical traction 12# x26mins   01/23/24- EVAL                                                                                                                                 PATIENT EDUCATION:  Education details: POC, HEP Person educated: Patient Education method: Explanation Education comprehension: verbalized understanding  HOME EXERCISE PROGRAM: Access Code: [email or web address] URL: [email or web address] Date: 01/23/2024 Prepared by: Cassie Freer  Exercises - Seated Upper Trapezius Stretch  - 2 x daily - 7 x weekly - 2 reps - 15 hold - Seated Levator Scapulae Stretch  - 2 x daily - 7 x weekly - 2 reps - 15 hold -  Median Nerve Flossing - Tray  - 2 x daily - 7 x weekly - 2 sets - 10 reps - Doorway Pec Stretch at 90 Degrees Abduction  - 2 x daily - 7 x weekly - 2 reps - 15 hold  ASSESSMENT:  CLINICAL IMPRESSION: Patient returns with no real changes in symptoms but has been doing the nerve glides which seems to provide some relief when she has the numbness and tingling. She is tight in upper traps and with STM to the L side she reports she can feel it  radiating down her arm. We tried traction today to see if it may help with offloading any possible compression on nerves.    Patient is a 68 y.o. female who was seen today for physical therapy evaluation and treatment for cervical radiculopathy with degenerative disease. Her symptoms started in January and has gotten progressively worse. She has discomfort radiating from the L shoulder blade to the first three fingers, associated with numbness and tingling. MRI revealed significant right-sided neuroforaminal stenosis at C5-C6 and multiple degenerative changes throughout the spine. She has been going to a chiro for 6 weeks now, 3x a week for adjustments and traction but has had no real changes. Patient presents with positive Spurling's and Distraction test. She also has some postural deficits and increased tightness in her upper traps. Patient will benefit from skilled PT to address her pain and numbness and tingling to be able to complete ADLs without discomfort.   OBJECTIVE IMPAIRMENTS: decreased ROM, decreased strength, postural dysfunction, and pain.   PERSONAL FACTORS: Age, Behavior pattern, and Time since onset of injury/illness/exacerbation are also affecting patient's functional outcome.   REHAB POTENTIAL: Good  CLINICAL DECISION MAKING: Stable/uncomplicated  EVALUATION COMPLEXITY: Low  GOALS: Goals reviewed with patient? Yes  SHORT TERM GOALS: Target date: 02/27/24  Patient will be independent with initial HEP.  Baseline: given 01/23/24 Goal status: INITIAL  LONG TERM GOALS: Target date:04/02/24  Patient will be independent with advanced/ongoing HEP to improve outcomes and carryover.  Baseline:  Goal status: INITIAL  2.  Patient will report 75% improvement in L shoulder and arm pain to improve QOL.  Baseline: 5/10 Goal status: INITIAL  3.  Patient will demonstrate full pain free cervical ROM an no pain with driving.  Baseline:  Goal status: INITIAL  4.  Patient will report  decreased numbness and  tingling into her hand by 50% Baseline:  Goal status: INITIAL  5.  Patient will report decreased tightness in upper traps and cervical/neck and improved posture Baseline: tightness in UT and rounded shoulders Goal status: INITIAL   PLAN:  PT FREQUENCY: 2x/week  PT DURATION: 10 weeks  PLANNED INTERVENTIONS: 97110-Therapeutic exercises, 97530- Therapeutic activity, 97112- Neuromuscular re-education, 97535- Self Care, 16109- Manual therapy, G0283- Electrical stimulation (unattended), 97012- Traction (mechanical), Patient/Family education, Taping, Dry Needling, Joint mobilization, Spinal mobilization, Cryotherapy, and Moist heat  PLAN FOR NEXT SESSION: nerve glides, STM and manual traction or with machine, postural exercises, can try dry needling if it seems appropriate    Cassie Freer, PT 01/29/2024, 6:40 PM

## 2024-01-30 ENCOUNTER — Ambulatory Visit: Admitting: Physical Therapy

## 2024-01-30 ENCOUNTER — Encounter: Payer: Self-pay | Admitting: Physical Therapy

## 2024-01-30 DIAGNOSIS — M5412 Radiculopathy, cervical region: Secondary | ICD-10-CM

## 2024-01-30 DIAGNOSIS — M79602 Pain in left arm: Secondary | ICD-10-CM

## 2024-01-30 NOTE — Therapy (Signed)
 OUTPATIENT PHYSICAL THERAPY CERVICAL TREATMENT   Patient Name: Kirsten Choi MRN: [id number] DOB:11/10/1955, 68 y.o., female Today's Date: 01/30/2024  END OF SESSION:  PT End of Session - 01/30/24 1359     Visit Number 3    Date for PT Re-Evaluation 04/02/24    Authorization Type Medicare    PT Start Time 1358    PT Stop Time 1443    PT Time Calculation (min) 45 min    Activity Tolerance Patient tolerated treatment well    Behavior During Therapy WFL for tasks assessed/performed              Past Medical History:  Diagnosis Date   Arthritis    Cancer (HCC)    breast CA (resolved)   Hypertension    Migraine    Past Surgical History:  Procedure Laterality Date   BREAST SURGERY     COSMETIC SURGERY     HERNIA REPAIR     REPLACEMENT TOTAL KNEE     WRIST SURGERY     Patient Active Problem List   Diagnosis Date Noted   Class 2 severe obesity due to excess calories with serious comorbidity and body mass index (BMI) of 39.0 to 39.9 in adult (HCC) 01/10/2024   Vitamin D deficiency 01/10/2024   Thoracic aortic atherosclerosis (HCC) 01/10/2024   History of Helicobacter pylori infection 01/10/2024   Acute anterior lower chest wall pain 01/10/2024   Chronic gastric ulcer without hemorrhage and without perforation 01/10/2024   S/P TKR (total knee replacement), left 01/10/2024   History of bilateral breast cancer 01/10/2024   Metabolic dysfunction-associated steatotic liver disease (MASLD) 01/10/2024   Cervical radiculopathy due to degenerative joint disease of spine 01/10/2024   Antiphospholipid syndrome (HCC) 01/10/2024   OSA (obstructive sleep apnea) 06/15/2022   Dyspnea 06/15/2022   Paroxysmal SVT (supraventricular tachycardia) (HCC) 01/20/2020   Hypertension    Bradycardia     PCP: Fanny Bien  REFERRING PROVIDER: Fanny Bien  REFERRING DIAG:  [phone] (ICD-10-CM) - Cervical radiculopathy due to degenerative joint disease of spine    THERAPY DIAG:   Radiculopathy, cervical region  Pain in left arm  Rationale for Evaluation and Treatment: Rehabilitation  ONSET DATE: since Jan  SUBJECTIVE:                                                                                                                                                                                                         SUBJECTIVE STATEMENT: Doing okay, some tingling in the left index finger  Hand dominance: Right  PERTINENT HISTORY:  Cervical radiculopathy with degenerative disease--Chronic left-sided cervical radiculopathy with significant discomfort radiating from the shoulder to the fingers, associated with numbness HTN, SVT, Antiphospholipid syndrome, MASLD, class 2 obesity, sleep apnea, hx of bilateral breast cancer, hx of L TKA  PAIN:  Are you having pain? Yes: NPRS scale: 5/10 Pain location: L shoulder blade and down my arm  Pain description: numbness/tingling, dull, throbbing  Aggravating factors: I don't know but it is worse in the morning Relieving factors: sometimes when I stretch my hands up it might be better  PRECAUTIONS: None  RED FLAGS: None     WEIGHT BEARING RESTRICTIONS: No  FALLS:  Has patient fallen in last 6 months? Yes. Number of falls 1, I stumble and trip because I am clumsy   LIVING ENVIRONMENT: Lives with: lives alone Lives in: House/apartment Stairs: Yes: External: 2 steps; avoid by going in through the garage  OCCUPATION: retired  PLOF: Independent  PATIENT GOALS: I want my numbness and pain gone and drive without numbness and pain  NEXT MD VISIT: I do but I don't remember when it is   OBJECTIVE:  Note: Objective measures were completed at Evaluation unless otherwise noted.  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   COGNITION: Overall cognitive status: Within functional limits for tasks  assessed  SENSATION: WFL  POSTURE: rounded shoulders, forward head, and increased thoracic kyphosis  PALPATION: Tightness in upper traps, some trigger points    CERVICAL ROM:   Active ROM A/PROM (deg) eval  Flexion WFL  Extension WFL "just tight"  Right lateral flexion 75%  Left lateral flexion 75%  Right rotation WFL  Left rotation 75%    (Blank rows = not tested)  UPPER EXTREMITY ROM: WFL   UPPER EXTREMITY MMT: overall 4/5 except abduction on both sides is weak 3+    CERVICAL SPECIAL TESTS:  Upper limb tension test (ULTT): Positive, Spurling's test: Positive, and Distraction test: Positive   TREATMENT DATE:  01/30/24 Nustep level 5 x 5 minutes Red tband Row Red tband extension with some left hand tingling Gentle cervical retraction Trigger Point Dry Needling  Initial Treatment: Pt instructed on Dry Needling rational, procedures, and possible side effects. Pt instructed to expect mild to moderate muscle soreness later in the day and/or into the next day.  Pt instructed in methods to reduce muscle soreness. Pt instructed to continue prescribed HEP. Patient was educated on signs and symptoms of infection and other risk factors and advised to seek medical attention should they occur.  Patient verbalized understanding of these instructions and education.  Patient Verbal Consent Given: Yes Education Handout Provided: Yes Muscles Treated: left upper trap Treatment Response/Outcome: LTR STM to the left upper trap and into the left neck area Static cervical traction 12#    01/29/24 UBE L2 x48mins each way STM, c-spine paraspinal stretching, passive ROM  Cervical traction 12# x63mins   01/23/24- EVAL  PATIENT EDUCATION:  Education details: POC, HEP Person educated: Patient Education method: Explanation Education comprehension: verbalized  understanding  HOME EXERCISE PROGRAM: Access Code: [email or web address] URL: [email or web address] Date: 01/23/2024 Prepared by: Cassie Freer  Exercises - Seated Upper Trapezius Stretch  - 2 x daily - 7 x weekly - 2 reps - 15 hold - Seated Levator Scapulae Stretch  - 2 x daily - 7 x weekly - 2 reps - 15 hold - Median Nerve Flossing - Tray  - 2 x daily - 7 x weekly - 2 sets - 10 reps - Doorway Pec Stretch at 90 Degrees Abduction  - 2 x daily - 7 x weekly - 2 reps - 15 hold  ASSESSMENT:  CLINICAL IMPRESSION: Patient reports maybe a little better, still with tingling in the left index finger, added DN after getting consent, significant spasms and LTR's with this, followed by STM to decrease soreness and then traction. She is tight in upper traps and with STM to the L side she reports she can feel it radiating down her arm. We tried traction today to see if it may help with offloading any possible compression on nerves.    Patient is a 68 y.o. female who was seen today for physical therapy evaluation and treatment for cervical radiculopathy with degenerative disease. Her symptoms started in January and has gotten progressively worse. She has discomfort radiating from the L shoulder blade to the first three fingers, associated with numbness and tingling. MRI revealed significant right-sided neuroforaminal stenosis at C5-C6 and multiple degenerative changes throughout the spine. She has been going to a chiro for 6 weeks now, 3x a week for adjustments and traction but has had no real changes. Patient presents with positive Spurling's and Distraction test. She also has some postural deficits and increased tightness in her upper traps. Patient will benefit from skilled PT to address her pain and numbness and tingling to be able to complete ADLs without discomfort.   OBJECTIVE IMPAIRMENTS: decreased ROM, decreased strength, postural dysfunction, and pain.   PERSONAL FACTORS: Age, Behavior pattern, and  Time since onset of injury/illness/exacerbation are also affecting patient's functional outcome.   REHAB POTENTIAL: Good  CLINICAL DECISION MAKING: Stable/uncomplicated  EVALUATION COMPLEXITY: Low  GOALS: Goals reviewed with patient? Yes  SHORT TERM GOALS: Target date: 02/27/24  Patient will be independent with initial HEP.  Baseline: given 01/23/24 Goal status: ongoing 01/29/24  LONG TERM GOALS: Target date:04/02/24  Patient will be independent with advanced/ongoing HEP to improve outcomes and carryover.  Baseline:  Goal status: INITIAL  2.  Patient will report 75% improvement in L shoulder and arm pain to improve QOL.  Baseline: 5/10 Goal status: INITIAL  3.  Patient will demonstrate full pain free cervical ROM an no pain with driving.  Baseline:  Goal status: INITIAL  4.  Patient will report decreased numbness and  tingling into her hand by 50% Baseline:  Goal status: INITIAL  5.  Patient will report decreased tightness in upper traps and cervical/neck and improved posture Baseline: tightness in UT and rounded shoulders Goal status: INITIAL   PLAN:  PT FREQUENCY: 2x/week  PT DURATION: 10 weeks  PLANNED INTERVENTIONS: 97110-Therapeutic exercises, 97530- Therapeutic activity, 97112- Neuromuscular re-education, 97535- Self Care, 16109- Manual therapy, G0283- Electrical stimulation (unattended), 97012- Traction (mechanical), Patient/Family education, Taping, Dry Needling, Joint mobilization, Spinal mobilization, Cryotherapy, and Moist heat  PLAN FOR NEXT SESSION: continue to work on postural mms, see how the DN and the traction are doing   Jearld Lesch,  PT 01/30/2024, 1:59 PM

## 2024-01-30 NOTE — Patient Instructions (Signed)

## 2024-02-05 ENCOUNTER — Ambulatory Visit: Attending: Family Medicine

## 2024-02-05 DIAGNOSIS — M5412 Radiculopathy, cervical region: Secondary | ICD-10-CM | POA: Insufficient documentation

## 2024-02-05 DIAGNOSIS — M79602 Pain in left arm: Secondary | ICD-10-CM | POA: Diagnosis present

## 2024-02-05 NOTE — Therapy (Signed)
 OUTPATIENT PHYSICAL THERAPY CERVICAL TREATMENT   Patient Name: Kirsten Choi MRN: [id number] DOB:1956/08/17, 68 y.o., female Today's Date: 02/05/2024  END OF SESSION:  PT End of Session - 02/05/24 1454     Visit Number 4    Date for PT Re-Evaluation 04/02/24    Authorization Type Medicare    PT Start Time 1500    PT Stop Time 1545    PT Time Calculation (min) 45 min    Activity Tolerance Patient tolerated treatment well    Behavior During Therapy WFL for tasks assessed/performed               Past Medical History:  Diagnosis Date   Arthritis    Cancer (HCC)    breast CA (resolved)   Hypertension    Migraine    Past Surgical History:  Procedure Laterality Date   BREAST SURGERY     COSMETIC SURGERY     HERNIA REPAIR     REPLACEMENT TOTAL KNEE     WRIST SURGERY     Patient Active Problem List   Diagnosis Date Noted   Class 2 severe obesity due to excess calories with serious comorbidity and body mass index (BMI) of 39.0 to 39.9 in adult (HCC) 01/10/2024   Vitamin D deficiency 01/10/2024   Thoracic aortic atherosclerosis (HCC) 01/10/2024   History of Helicobacter pylori infection 01/10/2024   Acute anterior lower chest wall pain 01/10/2024   Chronic gastric ulcer without hemorrhage and without perforation 01/10/2024   S/P TKR (total knee replacement), left 01/10/2024   History of bilateral breast cancer 01/10/2024   Metabolic dysfunction-associated steatotic liver disease (MASLD) 01/10/2024   Cervical radiculopathy due to degenerative joint disease of spine 01/10/2024   Antiphospholipid syndrome (HCC) 01/10/2024   OSA (obstructive sleep apnea) 06/15/2022   Dyspnea 06/15/2022   Paroxysmal SVT (supraventricular tachycardia) (HCC) 01/20/2020   Hypertension    Bradycardia     PCP: Fanny Bien  REFERRING PROVIDER: Fanny Bien  REFERRING DIAG:  [phone] (ICD-10-CM) - Cervical radiculopathy due to degenerative joint disease of spine    THERAPY DIAG:   Radiculopathy, cervical region  Pain in left arm  Rationale for Evaluation and Treatment: Rehabilitation  ONSET DATE: since Jan  SUBJECTIVE:                                                                                                                                                                                                         SUBJECTIVE STATEMENT: I didn't like the needles. The numbness and tingling is still there but it is better  and not as bad as it has been. Nerve conduction showed C7 radiculopathy and possibly some carpal tunnel on L side.   Hand dominance: Right  PERTINENT HISTORY:  Cervical radiculopathy with degenerative disease--Chronic left-sided cervical radiculopathy with significant discomfort radiating from the shoulder to the fingers, associated with numbness HTN, SVT, Antiphospholipid syndrome, MASLD, class 2 obesity, sleep apnea, hx of bilateral breast cancer, hx of L TKA  PAIN:  Are you having pain? Yes: NPRS scale: 5/10 Pain location: L shoulder blade and down my arm  Pain description: numbness/tingling, dull, throbbing  Aggravating factors: I don't know but it is worse in the morning Relieving factors: sometimes when I stretch my hands up it might be better  PRECAUTIONS: None  RED FLAGS: None     WEIGHT BEARING RESTRICTIONS: No  FALLS:  Has patient fallen in last 6 months? Yes. Number of falls 1, I stumble and trip because I am clumsy   LIVING ENVIRONMENT: Lives with: lives alone Lives in: House/apartment Stairs: Yes: External: 2 steps; avoid by going in through the garage  OCCUPATION: retired  PLOF: Independent  PATIENT GOALS: I want my numbness and pain gone and drive without numbness and pain  NEXT MD VISIT: I do but I don't remember when it is   OBJECTIVE:  Note: Objective measures were completed at Evaluation unless otherwise noted.  DIAGNOSTIC FINDINGS:  MRI revealed significant right-sided neuroforaminal stenosis at C5-C6  and multiple degenerative changes throughout the spine.   COGNITION: Overall cognitive status: Within functional limits for tasks assessed  SENSATION: WFL  POSTURE: rounded shoulders, forward head, and increased thoracic kyphosis  PALPATION: Tightness in upper traps, some trigger points    CERVICAL ROM:   Active ROM A/PROM (deg) eval  Flexion WFL  Extension WFL "just tight"  Right lateral flexion 75%  Left lateral flexion 75%  Right rotation WFL  Left rotation 75%    (Blank rows = not tested)  UPPER EXTREMITY ROM: WFL   UPPER EXTREMITY MMT: overall 4/5 except abduction on both sides is weak 3+    CERVICAL SPECIAL TESTS:  Upper limb tension test (ULTT): Positive, Spurling's test: Positive, and Distraction test: Positive   TREATMENT DATE:  02/05/24 UBE L3 x37mins each way  Red band rows and ext 2x10  Scapular retraction red band 2x10 Chin tucks ball against wall 2x10 UT stretch with weight 15s x2 Pec stretch in doorway 15sx2 Static cervical traction 10# x12 mins   01/30/24 Nustep level 5 x 5 minutes Red tband Row Red tband extension with some left hand tingling Gentle cervical retraction Trigger Point Dry Needling  Initial Treatment: Pt instructed on Dry Needling rational, procedures, and possible side effects. Pt instructed to expect mild to moderate muscle soreness later in the day and/or into the next day.  Pt instructed in methods to reduce muscle soreness. Pt instructed to continue prescribed HEP. Patient was educated on signs and symptoms of infection and other risk factors and advised to seek medical attention should they occur.  Patient verbalized understanding of these instructions and education.  Patient Verbal Consent Given: Yes Education Handout Provided: Yes Muscles Treated: left upper trap Treatment Response/Outcome: LTR STM to the left upper trap and into the left neck area Static cervical traction 12#    01/29/24 UBE L2 x64mins each way STM,  c-spine paraspinal stretching, passive ROM  Cervical traction 12# x78mins   01/23/24- EVAL  PATIENT EDUCATION:  Education details: POC, HEP Person educated: Patient Education method: Explanation Education comprehension: verbalized understanding  HOME EXERCISE PROGRAM: Access Code: [email or web address] URL: [email or web address] Date: 01/23/2024 Prepared by: Cassie Freer  Exercises - Seated Upper Trapezius Stretch  - 2 x daily - 7 x weekly - 2 reps - 15 hold - Seated Levator Scapulae Stretch  - 2 x daily - 7 x weekly - 2 reps - 15 hold - Median Nerve Flossing - Tray  - 2 x daily - 7 x weekly - 2 sets - 10 reps - Doorway Pec Stretch at 90 Degrees Abduction  - 2 x daily - 7 x weekly - 2 reps - 15 hold  ASSESSMENT:  CLINICAL IMPRESSION: Patient reports maybe a little better, still with tingling in the left index finger. Nerve conduction study showed C7 radiculopathy and carpal tunnel on the L side. Patient reports increased N/T into first 2 fingers with scapular retractions. Also felt increased signals with UT stretch on the R side. We tried traction again to see if it may help with offloading any possible compression on nerves. She caught a cramp in her mid back towards end of traction. Thinks that if we do some STM prior to traction, it helps her relax and works better.    Patient is a 68 y.o. female who was seen today for physical therapy evaluation and treatment for cervical radiculopathy with degenerative disease. Her symptoms started in January and has gotten progressively worse. She has discomfort radiating from the L shoulder blade to the first three fingers, associated with numbness and tingling. MRI revealed significant right-sided neuroforaminal stenosis at C5-C6 and multiple degenerative changes throughout the spine. She has been going to a chiro for 6  weeks now, 3x a week for adjustments and traction but has had no real changes. Patient presents with positive Spurling's and Distraction test. She also has some postural deficits and increased tightness in her upper traps. Patient will benefit from skilled PT to address her pain and numbness and tingling to be able to complete ADLs without discomfort.   OBJECTIVE IMPAIRMENTS: decreased ROM, decreased strength, postural dysfunction, and pain.   PERSONAL FACTORS: Age, Behavior pattern, and Time since onset of injury/illness/exacerbation are also affecting patient's functional outcome.   REHAB POTENTIAL: Good  CLINICAL DECISION MAKING: Stable/uncomplicated  EVALUATION COMPLEXITY: Low  GOALS: Goals reviewed with patient? Yes  SHORT TERM GOALS: Target date: 02/27/24  Patient will be independent with initial HEP.  Baseline: given 01/23/24 Goal status: ongoing 01/29/24  LONG TERM GOALS: Target date:04/02/24  Patient will be independent with advanced/ongoing HEP to improve outcomes and carryover.  Baseline:  Goal status: INITIAL  2.  Patient will report 75% improvement in L shoulder and arm pain to improve QOL.  Baseline: 5/10 Goal status: INITIAL  3.  Patient will demonstrate full pain free cervical ROM an no pain with driving.  Baseline:  Goal status: INITIAL  4.  Patient will report decreased numbness and  tingling into her hand by 50% Baseline:  Goal status: INITIAL  5.  Patient will report decreased tightness in upper traps and cervical/neck and improved posture Baseline: tightness in UT and rounded shoulders Goal status: INITIAL   PLAN:  PT FREQUENCY: 2x/week  PT DURATION: 10 weeks  PLANNED INTERVENTIONS: 97110-Therapeutic exercises, 97530- Therapeutic activity, [id number]- Neuromuscular re-education, 97535- Self Care, 29562- Manual therapy, [id number]- Electrical stimulation (unattended), 13086- Traction (mechanical), Patient/Family education, Taping, Dry Needling, Joint  mobilization, Spinal mobilization, Cryotherapy, and Moist heat  PLAN FOR  NEXT SESSION: continue to work on postural mms, see how the DN and the traction are doing   Cassie Freer, PT 02/05/2024, 3:46 PM

## 2024-02-06 NOTE — Therapy (Signed)
 OUTPATIENT PHYSICAL THERAPY CERVICAL TREATMENT   Patient Name: Kirsten Choi MRN: [id number] DOB:January 10, 1956, 68 y.o., female Today's Date: 02/07/2024  END OF SESSION:  PT End of Session - 02/07/24 1500     Visit Number 5    Date for PT Re-Evaluation 04/02/24    Authorization Type Medicare    PT Start Time 1500    PT Stop Time 1545    PT Time Calculation (min) 45 min    Activity Tolerance Patient tolerated treatment well    Behavior During Therapy WFL for tasks assessed/performed                Past Medical History:  Diagnosis Date   Arthritis    Cancer (HCC)    breast CA (resolved)   Hypertension    Migraine    Past Surgical History:  Procedure Laterality Date   BREAST SURGERY     COSMETIC SURGERY     HERNIA REPAIR     REPLACEMENT TOTAL KNEE     WRIST SURGERY     Patient Active Problem List   Diagnosis Date Noted   Class 2 severe obesity due to excess calories with serious comorbidity and body mass index (BMI) of 39.0 to 39.9 in adult (HCC) 01/10/2024   Vitamin D deficiency 01/10/2024   Thoracic aortic atherosclerosis (HCC) 01/10/2024   History of Helicobacter pylori infection 01/10/2024   Acute anterior lower chest wall pain 01/10/2024   Chronic gastric ulcer without hemorrhage and without perforation 01/10/2024   S/P TKR (total knee replacement), left 01/10/2024   History of bilateral breast cancer 01/10/2024   Metabolic dysfunction-associated steatotic liver disease (MASLD) 01/10/2024   Cervical radiculopathy due to degenerative joint disease of spine 01/10/2024   Antiphospholipid syndrome (HCC) 01/10/2024   OSA (obstructive sleep apnea) 06/15/2022   Dyspnea 06/15/2022   Paroxysmal SVT (supraventricular tachycardia) (HCC) 01/20/2020   Hypertension    Bradycardia     PCP: Fanny Bien  REFERRING PROVIDER: Fanny Bien  REFERRING DIAG:  [phone] (ICD-10-CM) - Cervical radiculopathy due to degenerative joint disease of spine    THERAPY DIAG:   Radiculopathy, cervical region  Pain in left arm  Rationale for Evaluation and Treatment: Rehabilitation  ONSET DATE: since Jan  SUBJECTIVE:                                                                                                                                                                                                         SUBJECTIVE STATEMENT: My joints are hurting, I think it might be from the weather. I feel tight.  More N/T today than other days, goes away if I do the exercises to just feel in my hands. Saw the chiro today, he used the laser and did some adjustments.   Hand dominance: Right  PERTINENT HISTORY:  Cervical radiculopathy with degenerative disease--Chronic left-sided cervical radiculopathy with significant discomfort radiating from the shoulder to the fingers, associated with numbness HTN, SVT, Antiphospholipid syndrome, MASLD, class 2 obesity, sleep apnea, hx of bilateral breast cancer, hx of L TKA  PAIN:  Are you having pain? Yes: NPRS scale: 5/10 Pain location: L shoulder blade and down my arm  Pain description: numbness/tingling, dull, throbbing  Aggravating factors: I don't know but it is worse in the morning Relieving factors: sometimes when I stretch my hands up it might be better  PRECAUTIONS: None  RED FLAGS: None     WEIGHT BEARING RESTRICTIONS: No  FALLS:  Has patient fallen in last 6 months? Yes. Number of falls 1, I stumble and trip because I am clumsy   LIVING ENVIRONMENT: Lives with: lives alone Lives in: House/apartment Stairs: Yes: External: 2 steps; avoid by going in through the garage  OCCUPATION: retired  PLOF: Independent  PATIENT GOALS: I want my numbness and pain gone and drive without numbness and pain  NEXT MD VISIT: I do but I don't remember when it is   OBJECTIVE:  Note: Objective measures were completed at Evaluation unless otherwise noted.  DIAGNOSTIC FINDINGS:  MRI revealed significant right-sided  neuroforaminal stenosis at C5-C6 and multiple degenerative changes throughout the spine.   COGNITION: Overall cognitive status: Within functional limits for tasks assessed  SENSATION: WFL  POSTURE: rounded shoulders, forward head, and increased thoracic kyphosis  PALPATION: Tightness in upper traps, some trigger points    CERVICAL ROM:   Active ROM A/PROM (deg) eval  Flexion WFL  Extension WFL "just tight"  Right lateral flexion 75%  Left lateral flexion 75%  Right rotation WFL  Left rotation 75%    (Blank rows = not tested)  UPPER EXTREMITY ROM: WFL   UPPER EXTREMITY MMT: overall 4/5 except abduction on both sides is weak 3+    CERVICAL SPECIAL TESTS:  Upper limb tension test (ULTT): Positive, Spurling's test: Positive, and Distraction test: Positive   TREATMENT DATE:  02/07/24 NuStep L5x57mins  Shoulder ext 5# 2x10 Cable row 10# 2x10 Wall angels 2x10 Foam roll on wall 2x10  Thoracic open book standing against wall 2x10 SNAG extension x10 Thoracic ext against half foam roll seated x10 Manual cervical traction and suboccipital release    02/05/24 UBE L3 x55mins each way  Red band rows and ext 2x10  Scapular retraction red band 2x10 Chin tucks ball against wall 2x10 UT stretch with weight 15s x2 Pec stretch in doorway 15sx2 Static cervical traction 10# x12 mins   01/30/24 Nustep level 5 x 5 minutes Red tband Row Red tband extension with some left hand tingling Gentle cervical retraction Trigger Point Dry Needling  Initial Treatment: Pt instructed on Dry Needling rational, procedures, and possible side effects. Pt instructed to expect mild to moderate muscle soreness later in the day and/or into the next day.  Pt instructed in methods to reduce muscle soreness. Pt instructed to continue prescribed HEP. Patient was educated on signs and symptoms of infection and other risk factors and advised to seek medical attention should they occur.  Patient verbalized  understanding of these instructions and education.  Patient Verbal Consent Given: Yes Education Handout Provided: Yes Muscles Treated: left upper trap Treatment Response/Outcome:  LTR STM to the left upper trap and into the left neck area Static cervical traction 12#    01/29/24 UBE L2 x51mins each way STM, c-spine paraspinal stretching, passive ROM  Cervical traction 12# x7mins   01/23/24- EVAL                                                                                                                                 PATIENT EDUCATION:  Education details: POC, HEP Person educated: Patient Education method: Explanation Education comprehension: verbalized understanding  HOME EXERCISE PROGRAM: Access Code: [email or web address] URL: [email or web address] Date: 01/23/2024 Prepared by: Cassie Freer  Exercises - Seated Upper Trapezius Stretch  - 2 x daily - 7 x weekly - 2 reps - 15 hold - Seated Levator Scapulae Stretch  - 2 x daily - 7 x weekly - 2 reps - 15 hold - Median Nerve Flossing - Tray  - 2 x daily - 7 x weekly - 2 sets - 10 reps - Doorway Pec Stretch at 90 Degrees Abduction  - 2 x daily - 7 x weekly - 2 reps - 15 hold  ASSESSMENT:  CLINICAL IMPRESSION: Patient reports maybe a little better, still with tingling that gets better with exercises and doesn't run down the arm but stays in the hand. We mostly worked on scapular and thoracic mobility work today. Has some numbness with shoulder ext and wall angels. Thoracic open book was difficult due to tightness in spine and in neck. Reports tightness in her neck is causing her to have a headache so I tried a suboccipital release and some manual traction.     EVAL: Patient is a 68 y.o. female who was seen today for physical therapy evaluation and treatment for cervical radiculopathy with degenerative disease. Her symptoms started in January and has gotten progressively worse. She has discomfort radiating from the L shoulder  blade to the first three fingers, associated with numbness and tingling. MRI revealed significant right-sided neuroforaminal stenosis at C5-C6 and multiple degenerative changes throughout the spine. She has been going to a chiro for 6 weeks now, 3x a week for adjustments and traction but has had no real changes. Patient presents with positive Spurling's and Distraction test. She also has some postural deficits and increased tightness in her upper traps. Patient will benefit from skilled PT to address her pain and numbness and tingling to be able to complete ADLs without discomfort.   OBJECTIVE IMPAIRMENTS: decreased ROM, decreased strength, postural dysfunction, and pain.   PERSONAL FACTORS: Age, Behavior pattern, and Time since onset of injury/illness/exacerbation are also affecting patient's functional outcome.   REHAB POTENTIAL: Good  CLINICAL DECISION MAKING: Stable/uncomplicated  EVALUATION COMPLEXITY: Low  GOALS: Goals reviewed with patient? Yes  SHORT TERM GOALS: Target date: 02/27/24  Patient will be independent with initial HEP.  Baseline: given 01/23/24 Goal status: ongoing 01/29/24  LONG TERM GOALS: Target date:04/02/24  Patient will be independent with advanced/ongoing HEP to  improve outcomes and carryover.  Baseline:  Goal status: INITIAL  2.  Patient will report 75% improvement in L shoulder and arm pain to improve QOL.  Baseline: 5/10 Goal status: INITIAL  3.  Patient will demonstrate full pain free cervical ROM an no pain with driving.  Baseline:  Goal status: INITIAL  4.  Patient will report decreased numbness and  tingling into her hand by 50% Baseline:  Goal status: INITIAL  5.  Patient will report decreased tightness in upper traps and cervical/neck and improved posture Baseline: tightness in UT and rounded shoulders Goal status: INITIAL   PLAN:  PT FREQUENCY: 2x/week  PT DURATION: 10 weeks  PLANNED INTERVENTIONS: 97110-Therapeutic exercises, 97530-  Therapeutic activity, 97112- Neuromuscular re-education, 97535- Self Care, 81191- Manual therapy, G0283- Electrical stimulation (unattended), 97012- Traction (mechanical), Patient/Family education, Taping, Dry Needling, Joint mobilization, Spinal mobilization, Cryotherapy, and Moist heat  PLAN FOR NEXT SESSION: continue to work on postural mms, see how the DN and the traction are doing   Cassie Freer, PT 02/07/2024, 4:14 PM

## 2024-02-07 ENCOUNTER — Ambulatory Visit

## 2024-02-07 DIAGNOSIS — M5412 Radiculopathy, cervical region: Secondary | ICD-10-CM | POA: Diagnosis not present

## 2024-02-07 DIAGNOSIS — M79602 Pain in left arm: Secondary | ICD-10-CM

## 2024-02-11 NOTE — Therapy (Signed)
 OUTPATIENT PHYSICAL THERAPY CERVICAL TREATMENT   Patient Name: Kirsten Choi MRN: [id number] DOB:[birth year], 68 y.o., female Today's Date: 02/12/2024  END OF SESSION:  PT End of Session - 02/12/24 0928     Visit Number 6    Date for PT Re-Evaluation 04/02/24    Authorization Type Medicare    PT Start Time 0930    PT Stop Time 1015    PT Time Calculation (min) 45 min    Activity Tolerance Patient tolerated treatment well    Behavior During Therapy WFL for tasks assessed/performed                 Past Medical History:  Diagnosis Date   Arthritis    Cancer (HCC)    breast CA (resolved)   Hypertension    Migraine    Past Surgical History:  Procedure Laterality Date   BREAST SURGERY     COSMETIC SURGERY     HERNIA REPAIR     REPLACEMENT TOTAL KNEE     WRIST SURGERY     Patient Active Problem List   Diagnosis Date Noted   Class 2 severe obesity due to excess calories with serious comorbidity and body mass index (BMI) of 39.0 to 39.9 in adult (HCC) 01/10/2024   Vitamin D deficiency 01/10/2024   Thoracic aortic atherosclerosis (HCC) 01/10/2024   History of Helicobacter pylori infection 01/10/2024   Acute anterior lower chest wall pain 01/10/2024   Chronic gastric ulcer without hemorrhage and without perforation 01/10/2024   S/P TKR (total knee replacement), left 01/10/2024   History of bilateral breast cancer 01/10/2024   Metabolic dysfunction-associated steatotic liver disease (MASLD) 01/10/2024   Cervical radiculopathy due to degenerative joint disease of spine 01/10/2024   Antiphospholipid syndrome (HCC) 01/10/2024   OSA (obstructive sleep apnea) 06/15/2022   Dyspnea 06/15/2022   Paroxysmal SVT (supraventricular tachycardia) (HCC) 01/20/2020   Hypertension    Bradycardia     PCP: Fanny Bien  REFERRING PROVIDER: Fanny Bien  REFERRING DIAG:  [phone] (ICD-10-CM) - Cervical radiculopathy due to degenerative joint disease of spine    THERAPY DIAG:   Radiculopathy, cervical region  Pain in left arm  Rationale for Evaluation and Treatment: Rehabilitation  ONSET DATE: since Jan  SUBJECTIVE:                                                                                                                                                                                                         SUBJECTIVE STATEMENT: Doing okay. Still having episodes of twinging. In bed if I roll on the  left side, it make the numbness and tingling gets worse.   Hand dominance: Right  PERTINENT HISTORY:  Cervical radiculopathy with degenerative disease--Chronic left-sided cervical radiculopathy with significant discomfort radiating from the shoulder to the fingers, associated with numbness HTN, SVT, Antiphospholipid syndrome, MASLD, class 2 obesity, sleep apnea, hx of bilateral breast cancer, hx of L TKA  PAIN:  Are you having pain? Yes: NPRS scale: 5/10 Pain location: L shoulder blade and down my arm  Pain description: numbness/tingling, dull, throbbing  Aggravating factors: I don't know but it is worse in the morning Relieving factors: sometimes when I stretch my hands up it might be better  PRECAUTIONS: None  RED FLAGS: None     WEIGHT BEARING RESTRICTIONS: No  FALLS:  Has patient fallen in last 6 months? Yes. Number of falls 1, I stumble and trip because I am clumsy   LIVING ENVIRONMENT: Lives with: lives alone Lives in: House/apartment Stairs: Yes: External: 2 steps; avoid by going in through the garage  OCCUPATION: retired  PLOF: Independent  PATIENT GOALS: I want my numbness and pain gone and drive without numbness and pain  NEXT MD VISIT: I do but I don't remember when it is   OBJECTIVE:  Note: Objective measures were completed at Evaluation unless otherwise noted.  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    COGNITION: Overall cognitive status: Within functional limits for tasks assessed  SENSATION: WFL  POSTURE: rounded shoulders, forward head, and increased thoracic kyphosis  PALPATION: Tightness in upper traps, some trigger points    CERVICAL ROM:   Active ROM A/PROM (deg) eval  Flexion WFL  Extension WFL "just tight"  Right lateral flexion 75%  Left lateral flexion 75%  Right rotation WFL  Left rotation 75%    (Blank rows = not tested)  UPPER EXTREMITY ROM: WFL   UPPER EXTREMITY MMT: overall 4/5 except abduction on both sides is weak 3+    CERVICAL SPECIAL TESTS:  Upper limb tension test (ULTT): Positive, Spurling's test: Positive, and Distraction test: Positive   TREATMENT DATE:  02/12/24 Review tendon glides for carpal tunnel Rows and ext green 2x10 UBE L3 x11mins each way Scapular retraction green 2x10 Face pull to ER red 2x10 OHP yellow ball 2x10 Rotations holding ball and standing against wall x10 STM with theragun to bilateral upper traps Passive stretching on upper traps   02/07/24 NuStep L5x87mins  Shoulder ext 5# 2x10 Cable row 10# 2x10 Wall angels 2x10 Foam roll on wall 2x10  Thoracic open book standing against wall 2x10 SNAG extension x10 Thoracic ext against half foam roll seated x10 Manual cervical traction and suboccipital release    02/05/24 UBE L3 x49mins each way  Red band rows and ext 2x10  Scapular retraction red band 2x10 Chin tucks ball against wall 2x10 UT stretch with weight 15s x2 Pec stretch in doorway 15sx2 Static cervical traction 10# x12 mins   01/30/24 Nustep level 5 x 5 minutes Red tband Row Red tband extension with some left hand tingling Gentle cervical retraction Trigger Point Dry Needling  Initial Treatment: Pt instructed on Dry Needling rational, procedures, and possible side effects. Pt instructed to expect mild to moderate muscle soreness later in the day and/or into the next day.  Pt instructed in methods to  reduce muscle soreness. Pt instructed to continue prescribed HEP. Patient was educated on signs and symptoms of infection and other risk factors and advised to  seek medical attention should they occur.  Patient verbalized understanding of these instructions and education.  Patient Verbal Consent Given: Yes Education Handout Provided: Yes Muscles Treated: left upper trap Treatment Response/Outcome: LTR STM to the left upper trap and into the left neck area Static cervical traction 12#    01/29/24 UBE L2 x72mins each way STM, c-spine paraspinal stretching, passive ROM  Cervical traction 12# x28mins   01/23/24- EVAL                                                                                                                                 PATIENT EDUCATION:  Education details: POC, HEP Person educated: Patient Education method: Explanation Education comprehension: verbalized understanding  HOME EXERCISE PROGRAM: Access Code: [email or web address] URL: [email or web address] Date: 01/23/2024 Prepared by: Cassie Freer  Exercises - Seated Upper Trapezius Stretch  - 2 x daily - 7 x weekly - 2 reps - 15 hold - Seated Levator Scapulae Stretch  - 2 x daily - 7 x weekly - 2 reps - 15 hold - Median Nerve Flossing - Tray  - 2 x daily - 7 x weekly - 2 sets - 10 reps - Doorway Pec Stretch at 90 Degrees Abduction  - 2 x daily - 7 x weekly - 2 reps - 15 hold  ASSESSMENT:  CLINICAL IMPRESSION: Patient reports maybe a little better, still with tingling but it is mostly in the hand and fingers.  We worked on some postural strengthening with bands. Has some numbness with certain exercises such as rows and face pulls. She has overacting of upper traps and needs cues to relax shoulders. Limited thoracic mobility with standing rotations against wall. Pt reports her tightness feels so much better after use of theragun to upper traps.     EVAL: Patient is a 68 y.o. female who was seen today for  physical therapy evaluation and treatment for cervical radiculopathy with degenerative disease. Her symptoms started in January and has gotten progressively worse. She has discomfort radiating from the L shoulder blade to the first three fingers, associated with numbness and tingling. MRI revealed significant right-sided neuroforaminal stenosis at C5-C6 and multiple degenerative changes throughout the spine. She has been going to a chiro for 6 weeks now, 3x a week for adjustments and traction but has had no real changes. Patient presents with positive Spurling's and Distraction test. She also has some postural deficits and increased tightness in her upper traps. Patient will benefit from skilled PT to address her pain and numbness and tingling to be able to complete ADLs without discomfort.   OBJECTIVE IMPAIRMENTS: decreased ROM, decreased strength, postural dysfunction, and pain.   PERSONAL FACTORS: Age, Behavior pattern, and Time since onset of injury/illness/exacerbation are also affecting patient's functional outcome.   REHAB POTENTIAL: Good  CLINICAL DECISION MAKING: Stable/uncomplicated  EVALUATION COMPLEXITY: Low  GOALS: Goals reviewed with patient? Yes  SHORT TERM GOALS: Target date: 02/27/24  Patient will be  independent with initial HEP.  Baseline: given 01/23/24 Goal status: ongoing 01/29/24  LONG TERM GOALS: Target date:04/02/24  Patient will be independent with advanced/ongoing HEP to improve outcomes and carryover.  Baseline:  Goal status: INITIAL  2.  Patient will report 75% improvement in L shoulder and arm pain to improve QOL.  Baseline: 5/10 Goal status: INITIAL  3.  Patient will demonstrate full pain free cervical ROM an no pain with driving.  Baseline:  Goal status: INITIAL  4.  Patient will report decreased numbness and  tingling into her hand by 50% Baseline:  Goal status: INITIAL  5.  Patient will report decreased tightness in upper traps and cervical/neck and  improved posture Baseline: tightness in UT and rounded shoulders Goal status: INITIAL   PLAN:  PT FREQUENCY: 2x/week  PT DURATION: 10 weeks  PLANNED INTERVENTIONS: 97110-Therapeutic exercises, 97530- Therapeutic activity, 97112- Neuromuscular re-education, 97535- Self Care, [id number]- Manual therapy, G0283- Electrical stimulation (unattended), 97012- Traction (mechanical), Patient/Family education, Taping, Dry Needling, Joint mobilization, Spinal mobilization, Cryotherapy, and Moist heat  PLAN FOR NEXT SESSION: continue to work on postural mms, see how the DN and the traction are doing   Cassie Freer, PT 02/12/2024, 10:24 AM

## 2024-02-12 ENCOUNTER — Ambulatory Visit

## 2024-02-12 DIAGNOSIS — M5412 Radiculopathy, cervical region: Secondary | ICD-10-CM

## 2024-02-12 DIAGNOSIS — M79602 Pain in left arm: Secondary | ICD-10-CM

## 2024-02-14 ENCOUNTER — Ambulatory Visit

## 2024-02-15 ENCOUNTER — Ambulatory Visit: Admitting: Family Medicine

## 2024-02-15 ENCOUNTER — Encounter: Payer: Self-pay | Admitting: Family Medicine

## 2024-02-15 VITALS — BP 163/88 | HR 77 | Temp 98.0°F | Resp 18 | Wt 241.0 lb

## 2024-02-15 DIAGNOSIS — I10 Essential (primary) hypertension: Secondary | ICD-10-CM

## 2024-02-15 DIAGNOSIS — J069 Acute upper respiratory infection, unspecified: Secondary | ICD-10-CM | POA: Diagnosis not present

## 2024-02-15 LAB — POCT INFLUENZA A/B
Influenza A, POC: NEGATIVE
Influenza B, POC: NEGATIVE

## 2024-02-15 LAB — POCT RAPID STREP A (OFFICE): Rapid Strep A Screen: NEGATIVE

## 2024-02-15 LAB — POC COVID19 BINAXNOW: SARS Coronavirus 2 Ag: NEGATIVE

## 2024-02-15 MED ORDER — BENZONATATE 200 MG PO CAPS: 200.0000 mg | ORAL_CAPSULE | Freq: Three times a day (TID) | ORAL | 0 refills | Status: DC | PRN

## 2024-02-15 MED ORDER — FLUTICASONE PROPIONATE 50 MCG/ACT NA SUSP: 2.0000 | Freq: Every day | NASAL | 6 refills | Status: DC

## 2024-02-15 NOTE — Progress Notes (Signed)
 Assessment/Plan:    Assessment & Plan Viral Upper Respiratory Infection Symptoms consistent with a viral upper respiratory infection, including cough, sore throat, and congestion for five days. COVID-19, influenza, and strep tests negative. Cough causing muscular soreness in the abdominal wall. No significant wheezing, chest pain, or shortness of breath. Condition likely self-limiting, focus on symptomatic relief. Hydrocodone not preferred due to sedative effects and lack of superior efficacy. - Prescribe Tessalon Perles (benzonatate) 200 mg, three times a day for cough suppression. - Recommend fluticasone nasal spray (Flonase) to reduce congestion. - Advise over-the-counter phenylephrine as a decongestant, ensuring it does not significantly elevate blood pressure. - Continue guaifenesin and dextromethorphan for cough and mucus relief. - Encourage use of Vicks VapoRub and humidifiers. - Monitor for worsening symptoms, such as increased abdominal pain or breathing difficulties. - Encourage fluid intake and rest. - Consider antihistamines like Benadryl or Zyrtec for additional symptom relief.  Hypertension Blood pressure elevated, likely due to current illness. On low-dose metoprolol, which can affect respiratory symptoms, but no asthma. Albuterol inhalers avoided due to potential increase in heart rate. - Recheck blood pressure to monitor for changes.      There are no discontinued medications.  Return if symptoms worsen or fail to improve.    Subjective:   Encounter date: 02/15/2024  Kirsten Choi is a 68 y.o. female who has Paroxysmal SVT (supraventricular tachycardia) (HCC); Hypertension; Bradycardia; OSA (obstructive sleep apnea); Dyspnea; Class 2 severe obesity due to excess calories with serious comorbidity and body mass index (BMI) of 39.0 to 39.9 in adult [facility]); Vitamin D deficiency; Thoracic aortic atherosclerosis (HCC); History of Helicobacter pylori infection; Acute  anterior lower chest wall pain; Chronic gastric ulcer without hemorrhage and without perforation; S/P TKR (total knee replacement), left; History of bilateral breast cancer; Metabolic dysfunction-associated steatotic liver disease (MASLD); Cervical radiculopathy due to degenerative joint disease of spine; Antiphospholipid syndrome (HCC); and Upper respiratory tract infection on their problem list..   She  has a past medical history of Arthritis, Cancer (HCC), Hypertension, and Migraine..   She presents with chief complaint of Cold like Symptoms  (Pt C/O of cough,headache, sore throat, congestion with pain in chest due to cough for 4 days. Pt used OTC medication for symptoms but haven't helped much.//Pt did a in home Covid test last night with negative results /HM due- mammogram, dexa scan and Hep C screening) .   Discussed the use of AI scribe software for clinical note transcription with the patient, who gave verbal consent to proceed.  History of Present Illness Kirsten Choi is a 68 year old female who presents with a persistent cough and abdominal pain.  She has been experiencing a persistent cough for five days, which has not improved with Coricidin. She has used hydrocodone from a previous illness for relief. The cough is productive of thick sputum and is accompanied by a slight wheeze and some sinus congestion. No chest pain or shortness of breath outside of coughing. She uses Vicks VapoRub and Tylenol for associated headaches.  The cough is causing significant abdominal pain, particularly when coughing, and she feels the need to hold her abdomen to manage the pain. She describes the pain as being in the abdominal wall area, which she attributes to frequent coughing. She has a history of an abdominal tram flap with mesh, which she suspects might be aggravated by the coughing. No nausea, vomiting, or significant ear discomfort, although her ears occasionally pop.  Her current medications include  over-the-counter remedies such  as chlorpheniramine, dextromethorphan, and Mucinex with guaifenesin, which she is careful not to take simultaneously. She also uses a humidifier at home and is mindful of her breathing and abdominal pain. She is on a low dose of metoprolol (12 mg) and does not have a history of asthma.  She has tested negative for COVID-19, flu, and strep both at home and in the clinic. She has previously had flu A and strep. There has been no significant change in her symptoms over the past few days.  Her blood pressure is elevated, which she attributes to feeling unwell, but she notes it was normal at home.       Past Surgical History:  Procedure Laterality Date   BREAST SURGERY     COSMETIC SURGERY     HERNIA REPAIR     REPLACEMENT TOTAL KNEE     WRIST SURGERY      Outpatient Medications Prior to Visit  Medication Sig Dispense Refill   amLODipine (NORVASC) 5 MG tablet Take 1 tablet (5 mg total) by mouth daily. 90 tablet 3   aspirin EC 81 MG tablet Take 1 tablet (81 mg total) by mouth daily. Swallow whole.     Cholecalciferol 25 MCG (1000 UT) tablet Take 1 tablet (1,000 Units total) by mouth daily. 90 tablet 3   ezetimibe (ZETIA) 10 MG tablet Take 1 tablet (10 mg total) by mouth daily. 90 tablet 3   lisinopril (ZESTRIL) 40 MG tablet Take 1 tablet (40 mg total) by mouth daily. 90 tablet 3   meloxicam (MOBIC) 15 MG tablet PLEASE SEE ATTACHED FOR DETAILED DIRECTIONS     methocarbamol (ROBAXIN-750) 750 MG tablet Take 1 tablet (750 mg total) by mouth every 6 (six) hours as needed for muscle spasms. 30 tablet 0   metoprolol succinate (TOPROL-XL) 25 MG 24 hr tablet Take 0.5 tablets (12.5 mg total) by mouth daily. 45 tablet 3   omeprazole (PRILOSEC) 40 MG capsule Take 40 mg by mouth 2 (two) times daily.     psyllium (METAMUCIL) 58.6 % packet Take 1 packet by mouth daily.     No facility-administered medications prior to visit.    Family History  Problem Relation Age of  Onset   Leukemia Mother    Stroke Father    CAD Father    Cancer Father     Social History   Socioeconomic History   Marital status: Widowed    Spouse name: Not on file   Number of children: Not on file   Years of education: Not on file   Highest education level: Not on file  Occupational History   Not on file  Tobacco Use   Smoking status: Never   Smokeless tobacco: Never  Vaping Use   Vaping status: Never Used  Substance and Sexual Activity   Alcohol use: Never   Drug use: Never   Sexual activity: Not on file  Other Topics Concern   Not on file  Social History Narrative   Not on file   Social Drivers of Health   Financial Resource Strain: Unknown (08/15/2022)   Received from [facility]   Overall Financial Resource Strain (CARDIA)  Food Insecurity: Low Risk  (01/16/2024)   Received from [facility]   Hunger Vital Sign    Worried About Running Out of Food in the Last Year: Never true    Ran Out of Food in the Last Year: Never true  Transportation Needs: No Transportation Needs (01/16/2024)   Received from [facility]  Transportation    In the past 12 months, has lack of reliable transportation kept you from medical appointments, meetings, work or from getting things needed for daily living? : No  Physical Activity: Sufficiently Active (08/15/2022)   Received from [facility]   Exercise Vital Sign  Stress: No Stress Concern Present (08/15/2022)   Received from [facility] - Occupational Stress Questionnaire  Social Connections: Unknown (08/15/2022)   Received from [facility] visits prior to 01/06/2023., [facility] visits prior to 01/06/2023.   Social Advertising account executive [NHANES]    Frequency of Communication with Friends and Family: More than three times a week    Frequency of Social Gatherings with Friends and Family: More than three times a week    Attends  Religious Services: More than 4 times per year    Active Member of Clubs or Organizations: Yes    Attends Banker Meetings: More than 4 times per year    Marital Status: Patient refused  Intimate Partner Violence: Unknown (08/15/2022)   Received from [facility] visits prior to 01/06/2023., [facility] visits prior to 01/06/2023.   Humiliation, Afraid, Rape, and Kick questionnaire    Fear of Current or Ex-Partner: Patient refused    Emotionally Abused: Patient refused    Physically Abused: Patient refused    Sexually Abused: Patient refused                                                                                                  Objective:  Physical Exam: BP (!) 163/88 (BP Location: Left Arm, Patient Position: Sitting, Cuff Size: Large) Comment: recheck after resting  Pulse 77   Temp 98 F (36.7 C) (Oral)   Resp 18   Wt 241 lb (109.3 kg)   SpO2 97%   BMI 40.10 kg/m    Physical Exam VITALS: P- 77, SaO2- 97% GENERAL: Alert, cooperative, well developed, no acute distress. HEENT: Normocephalic, normal oropharynx, moist mucous membranes, nasal passages normal, ears normal. CHEST: Lungs predominantly clear to auscultation bilaterally, no wheezes, rhonchi, or crackles. CARDIOVASCULAR: Normal heart rate and rhythm, S1 and S2 normal without murmurs. ABDOMEN: Soft, non-tender, non-distended, without organomegaly, normal bowel sounds. EXTREMITIES: No cyanosis or edema. NEUROLOGICAL: Cranial nerves grossly intact, moves all extremities without gross motor or sensory deficit.     No results found.  Recent Results (from the past 2160 hours)  POCT rapid strep A     Status: Normal   Collection Time: 02/15/24  1:18 PM  Result Value Ref Range   Rapid Strep A Screen Negative Negative  POC COVID-19 BinaxNow     Status: Normal   Collection Time: 02/15/24  1:23 PM  Result Value Ref Range   SARS Coronavirus 2 Ag Negative Negative   POCT Influenza A/B     Status: Normal   Collection Time: 02/15/24  1:23 PM  Result Value Ref Range   Influenza A, POC Negative Negative   Influenza B, POC Negative Negative  Carnell Christian, MD, MS

## 2024-02-15 NOTE — Patient Instructions (Signed)
 VISIT SUMMARY:  During your visit, we discussed your persistent cough and abdominal pain. You have been experiencing a productive cough with thick sputum, slight wheezing, and sinus congestion for five days. The cough has caused significant abdominal pain, especially when coughing. You have tested negative for COVID-19, flu, and strep. Your blood pressure was elevated, which you believe is due to feeling unwell.  YOUR PLAN:  -VIRAL UPPER RESPIRATORY INFECTION: A viral upper respiratory infection is a common illness caused by a virus, leading to symptoms like cough, sore throat, and congestion. For your cough, you have been prescribed Tessalon Perles (benzonatate) 200 mg, to be taken three times a day. You should use fluticasone nasal spray (Flonase) to reduce congestion and over-the-counter phenylephrine as a decongestant, ensuring it does not significantly elevate your blood pressure. Continue using guaifenesin and dextromethorphan for cough and mucus relief, and Vicks VapoRub and humidifiers for comfort. Monitor for worsening symptoms, such as increased abdominal pain or breathing difficulties. Stay hydrated and get plenty of rest. You may also consider using antihistamines like Benadryl or Zyrtec for additional symptom relief.  -HYPERTENSION: Hypertension, or high blood pressure, can be influenced by illness. Your blood pressure was elevated today, likely due to your current illness. You are on a low dose of metoprolol, which can affect respiratory symptoms. We will recheck your blood pressure to monitor for changes.  INSTRUCTIONS:  Please follow the prescribed medication regimen and monitor your symptoms closely. If you experience worsening symptoms, such as increased abdominal pain or breathing difficulties, seek medical attention. We will recheck your blood pressure to monitor for any changes.

## 2024-02-18 ENCOUNTER — Telehealth: Payer: Self-pay

## 2024-02-18 NOTE — Therapy (Signed)
 OUTPATIENT PHYSICAL THERAPY CERVICAL TREATMENT   Patient Name: Kirsten Choi MRN: [id number] DOB:Oct 09, 1956, 68 y.o., female Today's Date: 02/19/2024  END OF SESSION:  PT End of Session - 02/19/24 0930     Visit Number 7    Date for PT Re-Evaluation 04/02/24    Authorization Type Medicare    PT Start Time 0930    PT Stop Time 1015    PT Time Calculation (min) 45 min    Activity Tolerance Patient tolerated treatment well    Behavior During Therapy WFL for tasks assessed/performed                  Past Medical History:  Diagnosis Date   Arthritis    Cancer (HCC)    breast CA (resolved)   Hypertension    Migraine    Past Surgical History:  Procedure Laterality Date   BREAST SURGERY     COSMETIC SURGERY     HERNIA REPAIR     REPLACEMENT TOTAL KNEE     WRIST SURGERY     Patient Active Problem List   Diagnosis Date Noted   Class 2 severe obesity due to excess calories with serious comorbidity and body mass index (BMI) of 39.0 to 39.9 in adult (HCC) 01/10/2024   Vitamin D deficiency 01/10/2024   Thoracic aortic atherosclerosis (HCC) 01/10/2024   History of Helicobacter pylori infection 01/10/2024   Acute anterior lower chest wall pain 01/10/2024   Chronic gastric ulcer without hemorrhage and without perforation 01/10/2024   S/P TKR (total knee replacement), left 01/10/2024   History of bilateral breast cancer 01/10/2024   Metabolic dysfunction-associated steatotic liver disease (MASLD) 01/10/2024   Cervical radiculopathy due to degenerative joint disease of spine 01/10/2024   Antiphospholipid syndrome (HCC) 01/10/2024   OSA (obstructive sleep apnea) 06/15/2022   Dyspnea 06/15/2022   Paroxysmal SVT (supraventricular tachycardia) (HCC) 01/20/2020   Hypertension    Bradycardia     PCP: Kasandra Pain  REFERRING PROVIDER: Kasandra Pain  REFERRING DIAG:  [phone] (ICD-10-CM) - Cervical radiculopathy due to degenerative joint disease of spine    THERAPY  DIAG:  Radiculopathy, cervical region  Pain in left arm  Rationale for Evaluation and Treatment: Rehabilitation  ONSET DATE: since Jan  SUBJECTIVE:                                                                                                                                                                                                         SUBJECTIVE STATEMENT: I had a cold last week. The neck is okay. First thing in  the morning and with driving I still feel the numbness in my thumb and index finger. No pain today.   Hand dominance: Right  PERTINENT HISTORY:  Cervical radiculopathy with degenerative disease--Chronic left-sided cervical radiculopathy with significant discomfort radiating from the shoulder to the fingers, associated with numbness HTN, SVT, Antiphospholipid syndrome, MASLD, class 2 obesity, sleep apnea, hx of bilateral breast cancer, hx of L TKA  PAIN:  Are you having pain? Yes: NPRS scale: 5/10 Pain location: L shoulder blade and down my arm  Pain description: numbness/tingling, dull, throbbing  Aggravating factors: I don't know but it is worse in the morning Relieving factors: sometimes when I stretch my hands up it might be better  PRECAUTIONS: None  RED FLAGS: None     WEIGHT BEARING RESTRICTIONS: No  FALLS:  Has patient fallen in last 6 months? Yes. Number of falls 1, I stumble and trip because I am clumsy   LIVING ENVIRONMENT: Lives with: lives alone Lives in: House/apartment Stairs: Yes: External: 2 steps; avoid by going in through the garage  OCCUPATION: retired  PLOF: Independent  PATIENT GOALS: I want my numbness and pain gone and drive without numbness and pain  NEXT MD VISIT: I do but I don't remember when it is   OBJECTIVE:  Note: Objective measures were completed at Evaluation unless otherwise noted.  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   COGNITION: Overall cognitive status: Within functional limits for tasks assessed  SENSATION: WFL  POSTURE: rounded shoulders, forward head, and increased thoracic kyphosis  PALPATION: Tightness in upper traps, some trigger points    CERVICAL ROM:   Active ROM A/PROM (deg) eval  Flexion WFL  Extension WFL "just tight"  Right lateral flexion 75%  Left lateral flexion 75%  Right rotation WFL  Left rotation 75%    (Blank rows = not tested)  UPPER EXTREMITY ROM: WFL   UPPER EXTREMITY MMT: overall 4/5 except abduction on both sides is weak 3+    CERVICAL SPECIAL TESTS:  Upper limb tension test (ULTT): Positive, Spurling's test: Positive, and Distraction test: Positive   TREATMENT DATE:  02/19/24 Standing UT stretch with 4#  Pec stretch 15s each side  UBE L3 x47mins each way  Seated row 15# 2x10  Lat pull down 20# 2x10 Chest press 5# 2x10  Shoulder ext 5# 2x10  Face pull bilaterally with red band x10 OHP yellow ball 2x10 Seated passive stretching   02/12/24 Review tendon glides for carpal tunnel Rows and ext green 2x10 UBE L3 x29mins each way Scapular retraction green 2x10 Face pull to ER red LUE 2x10 OHP yellow ball 2x10 Rotations holding ball and standing against wall x10 STM with theragun to bilateral upper traps Passive stretching on upper traps   02/07/24 NuStep L5x74mins  Shoulder ext 5# 2x10 Cable row 10# 2x10 Wall angels 2x10 Foam roll on wall 2x10  Thoracic open book standing against wall 2x10 SNAG extension x10 Thoracic ext against half foam roll seated x10 Manual cervical traction and suboccipital release    02/05/24 UBE L3 x54mins each way  Red band rows and ext 2x10  Scapular retraction red band 2x10 Chin tucks ball against wall 2x10 UT stretch with weight 15s x2 Pec stretch in doorway 15sx2 Static cervical traction 10# x12 mins   01/30/24 Nustep level 5 x 5 minutes Red tband Row Red tband extension with some left  hand tingling Gentle cervical retraction Trigger Point  Dry Needling  Initial Treatment: Pt instructed on Dry Needling rational, procedures, and possible side effects. Pt instructed to expect mild to moderate muscle soreness later in the day and/or into the next day.  Pt instructed in methods to reduce muscle soreness. Pt instructed to continue prescribed HEP. Patient was educated on signs and symptoms of infection and other risk factors and advised to seek medical attention should they occur.  Patient verbalized understanding of these instructions and education.  Patient Verbal Consent Given: Yes Education Handout Provided: Yes Muscles Treated: left upper trap Treatment Response/Outcome: LTR STM to the left upper trap and into the left neck area Static cervical traction 12#    01/29/24 UBE L2 x66mins each way STM, c-spine paraspinal stretching, passive ROM  Cervical traction 12# x48mins   01/23/24- EVAL                                                                                                                                 PATIENT EDUCATION:  Education details: POC, HEP Person educated: Patient Education method: Explanation Education comprehension: verbalized understanding  HOME EXERCISE PROGRAM: Access Code: [email or web address] URL: [email or web address] Date: 01/23/2024 Prepared by: Donavon Fudge  Exercises - Seated Upper Trapezius Stretch  - 2 x daily - 7 x weekly - 2 reps - 15 hold - Seated Levator Scapulae Stretch  - 2 x daily - 7 x weekly - 2 reps - 15 hold - Median Nerve Flossing - Tray  - 2 x daily - 7 x weekly - 2 sets - 10 reps - Doorway Pec Stretch at 90 Degrees Abduction  - 2 x daily - 7 x weekly - 2 reps - 15 hold  ASSESSMENT:  CLINICAL IMPRESSION: Patient reports maybe a little better, tingling has reduced to just be in her thumb and index finger now.  We worked on some postural strengthening with machines and bands. She has trouble following  directions and keeping form for face pull exercise.  She has overacting of upper traps and needs cues to relax shoulders with almost all exercises. Advised to be more mindful of her posture and try to relax her shoulders as much as possible.    EVAL: Patient is a 68 y.o. female who was seen today for physical therapy evaluation and treatment for cervical radiculopathy with degenerative disease. Her symptoms started in January and has gotten progressively worse. She has discomfort radiating from the L shoulder blade to the first three fingers, associated with numbness and tingling. MRI revealed significant right-sided neuroforaminal stenosis at C5-C6 and multiple degenerative changes throughout the spine. She has been going to a chiro for 6 weeks now, 3x a week for adjustments and traction but has had no real changes. Patient presents with positive Spurling's and Distraction test. She also has some postural deficits and increased tightness in her upper traps. Patient will benefit from skilled PT to address her pain and numbness and tingling to be able to  complete ADLs without discomfort.   OBJECTIVE IMPAIRMENTS: decreased ROM, decreased strength, postural dysfunction, and pain.   PERSONAL FACTORS: Age, Behavior pattern, and Time since onset of injury/illness/exacerbation are also affecting patient's functional outcome.   REHAB POTENTIAL: Good  CLINICAL DECISION MAKING: Stable/uncomplicated  EVALUATION COMPLEXITY: Low  GOALS: Goals reviewed with patient? Yes  SHORT TERM GOALS: Target date: 02/27/24  Patient will be independent with initial HEP.  Baseline: given 01/23/24 Goal status: ongoing 01/29/24  LONG TERM GOALS: Target date:04/02/24  Patient will be independent with advanced/ongoing HEP to improve outcomes and carryover.  Baseline:  Goal status: INITIAL  2.  Patient will report 75% improvement in L shoulder and arm pain to improve QOL.  Baseline: 5/10 Goal status: INITIAL  3.   Patient will demonstrate full pain free cervical ROM an no pain with driving.  Baseline:  Goal status: INITIAL  4.  Patient will report decreased numbness and  tingling into her hand by 50% Baseline:  Goal status: INITIAL  5.  Patient will report decreased tightness in upper traps and cervical/neck and improved posture Baseline: tightness in UT and rounded shoulders Goal status: INITIAL   PLAN:  PT FREQUENCY: 2x/week  PT DURATION: 10 weeks  PLANNED INTERVENTIONS: 97110-Therapeutic exercises, 97530- Therapeutic activity, 97112- Neuromuscular re-education, 97535- Self Care, 16109- Manual therapy, [id number]- Electrical stimulation (unattended), 97012- Traction (mechanical), Patient/Family education, Taping, Dry Needling, Joint mobilization, Spinal mobilization, Cryotherapy, and Moist heat  PLAN FOR NEXT SESSION: continue to work on postural mms, see how the DN and the traction are doing   Donavon Fudge, PT 02/19/2024, 10:11 AM

## 2024-02-18 NOTE — Telephone Encounter (Signed)
 Copied from CRM [id number]. Topic: General - Other >> Feb 15, 2024  3:32 PM Caliyah H wrote: Reason for CRM: Patient was seen in office today for cold-like symptoms. She states she tested negative on all tests administered during the visit and is now inquiring whether she needs to quarantine. Callback number [phone]

## 2024-02-19 ENCOUNTER — Ambulatory Visit

## 2024-02-19 DIAGNOSIS — M79602 Pain in left arm: Secondary | ICD-10-CM

## 2024-02-19 DIAGNOSIS — J069 Acute upper respiratory infection, unspecified: Secondary | ICD-10-CM | POA: Insufficient documentation

## 2024-02-19 DIAGNOSIS — M5412 Radiculopathy, cervical region: Secondary | ICD-10-CM

## 2024-02-21 ENCOUNTER — Encounter: Payer: Self-pay | Admitting: Physical Therapy

## 2024-02-21 ENCOUNTER — Ambulatory Visit: Admitting: Physical Therapy

## 2024-02-21 DIAGNOSIS — M5412 Radiculopathy, cervical region: Secondary | ICD-10-CM

## 2024-02-21 DIAGNOSIS — M79602 Pain in left arm: Secondary | ICD-10-CM

## 2024-02-21 NOTE — Therapy (Signed)
 OUTPATIENT PHYSICAL THERAPY CERVICAL TREATMENT   Patient Name: Kirsten Choi MRN: [id number] DOB:31-Dec-1955, 68 y.o., female Today's Date: 02/21/2024  END OF SESSION:  PT End of Session - 02/21/24 0929     Visit Number 8    Date for PT Re-Evaluation 04/02/24    PT Start Time 0930    PT Stop Time 1015    PT Time Calculation (min) 45 min    Activity Tolerance Patient tolerated treatment well    Behavior During Therapy WFL for tasks assessed/performed                  Past Medical History:  Diagnosis Date   Arthritis    Cancer (HCC)    breast CA (resolved)   Hypertension    Migraine    Past Surgical History:  Procedure Laterality Date   BREAST SURGERY     COSMETIC SURGERY     HERNIA REPAIR     REPLACEMENT TOTAL KNEE     WRIST SURGERY     Patient Active Problem List   Diagnosis Date Noted   Upper respiratory tract infection 02/19/2024   Class 2 severe obesity due to excess calories with serious comorbidity and body mass index (BMI) of 39.0 to 39.9 in adult (HCC) 01/10/2024   Vitamin D deficiency 01/10/2024   Thoracic aortic atherosclerosis (HCC) 01/10/2024   History of Helicobacter pylori infection 01/10/2024   Acute anterior lower chest wall pain 01/10/2024   Chronic gastric ulcer without hemorrhage and without perforation 01/10/2024   S/P TKR (total knee replacement), left 01/10/2024   History of bilateral breast cancer 01/10/2024   Metabolic dysfunction-associated steatotic liver disease (MASLD) 01/10/2024   Cervical radiculopathy due to degenerative joint disease of spine 01/10/2024   Antiphospholipid syndrome (HCC) 01/10/2024   OSA (obstructive sleep apnea) 06/15/2022   Dyspnea 06/15/2022   Paroxysmal SVT (supraventricular tachycardia) (HCC) 01/20/2020   Hypertension    Bradycardia     PCP: Fanny Bien  REFERRING PROVIDER: Fanny Bien  REFERRING DIAG:  [phone] (ICD-10-CM) - Cervical radiculopathy due to degenerative joint disease of spine     THERAPY DIAG:  Radiculopathy, cervical region  Pain in left arm  Rationale for Evaluation and Treatment: Rehabilitation  ONSET DATE: since Jan  SUBJECTIVE:                                                                                                                                                                                                         SUBJECTIVE STATEMENT: Numbness in the fifth and 1st and second digit. Some tightness  Hand dominance: Right  PERTINENT HISTORY:  Cervical radiculopathy with degenerative disease--Chronic left-sided cervical radiculopathy with significant discomfort radiating from the shoulder to the fingers, associated with numbness HTN, SVT, Antiphospholipid syndrome, MASLD, class 2 obesity, sleep apnea, hx of bilateral breast cancer, hx of L TKA  PAIN:  Are you having pain? Yes: NPRS scale: 0/10 Pain location: L shoulder blade and down my arm  Pain description: numbness/tingling, dull, throbbing  Aggravating factors: I don't know but it is worse in the morning Relieving factors: sometimes when I stretch my hands up it might be better  PRECAUTIONS: None  RED FLAGS: None     WEIGHT BEARING RESTRICTIONS: No  FALLS:  Has patient fallen in last 6 months? Yes. Number of falls 1, I stumble and trip because I am clumsy   LIVING ENVIRONMENT: Lives with: lives alone Lives in: House/apartment Stairs: Yes: External: 2 steps; avoid by going in through the garage  OCCUPATION: retired  PLOF: Independent  PATIENT GOALS: I want my numbness and pain gone and drive without numbness and pain  NEXT MD VISIT: I do but I don't remember when it is   OBJECTIVE:  Note: Objective measures were completed at Evaluation unless otherwise noted.  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   COGNITION: Overall cognitive status: Within functional  limits for tasks assessed  SENSATION: WFL  POSTURE: rounded shoulders, forward head, and increased thoracic kyphosis  PALPATION: Tightness in upper traps, some trigger points    CERVICAL ROM:   Active ROM A/PROM (deg) eval  Flexion WFL  Extension WFL "just tight"  Right lateral flexion 75%  Left lateral flexion 75%  Right rotation WFL  Left rotation 75%    (Blank rows = not tested)  UPPER EXTREMITY ROM: WFL   UPPER EXTREMITY MMT: overall 4/5 except abduction on both sides is weak 3+    CERVICAL SPECIAL TESTS:  Upper limb tension test (ULTT): Positive, Spurling's test: Positive, and Distraction test: Positive   TREATMENT DATE:  02/21/24 AAROM shoulder Flex, Ext, IR  3lb WaTE x10 NuStep L5 x 4 min Shoulder ER red 2x10 Cervical retractions  Horiz abd red 2x10 Seated rows green 2x15 Standing shoulder Ext green 2x10 STM to UT, and cervical spine Cervical traction 13lb x10 min 02/19/24 Standing UT stretch with 4#  Pec stretch 15s each side  UBE L3 x14mins each way  Seated row 15# 2x10  Lat pull down 20# 2x10 Chest press 5# 2x10  Shoulder ext 5# 2x10  Face pull bilaterally with red band x10 OHP yellow ball 2x10 Seated passive stretching   02/12/24 Review tendon glides for carpal tunnel Rows and ext green 2x10 UBE L3 x58mins each way Scapular retraction green 2x10 Face pull to ER red LUE 2x10 OHP yellow ball 2x10 Rotations holding ball and standing against wall x10 STM with theragun to bilateral upper traps Passive stretching on upper traps   02/07/24 NuStep L5x65mins  Shoulder ext 5# 2x10 Cable row 10# 2x10 Wall angels 2x10 Foam roll on wall 2x10  Thoracic open book standing against wall 2x10 SNAG extension x10 Thoracic ext against half foam roll seated x10 Manual cervical traction and suboccipital release    02/05/24 UBE L3 x54mins each way  Red band rows and ext 2x10  Scapular retraction red band 2x10 Chin tucks ball against wall 2x10 UT stretch with  weight 15s x2 Pec stretch in doorway 15sx2 Static cervical traction 10# x12 mins  01/30/24 Nustep level 5 x 5 minutes Red tband Row Red tband extension with some left hand tingling Gentle cervical retraction Trigger Point Dry Needling  Initial Treatment: Pt instructed on Dry Needling rational, procedures, and possible side effects. Pt instructed to expect mild to moderate muscle soreness later in the day and/or into the next day.  Pt instructed in methods to reduce muscle soreness. Pt instructed to continue prescribed HEP. Patient was educated on signs and symptoms of infection and other risk factors and advised to seek medical attention should they occur.  Patient verbalized understanding of these instructions and education.  Patient Verbal Consent Given: Yes Education Handout Provided: Yes Muscles Treated: left upper trap Treatment Response/Outcome: LTR STM to the left upper trap and into the left neck area Static cervical traction 12#    01/29/24 UBE L2 x47mins each way STM, c-spine paraspinal stretching, passive ROM  Cervical traction 12# x48mins   01/23/24- EVAL                                                                                                                                 PATIENT EDUCATION:  Education details: POC, HEP Person educated: Patient Education method: Explanation Education comprehension: verbalized understanding  HOME EXERCISE PROGRAM: Access Code: [email or web address] URL: [email or web address] Date: 01/23/2024 Prepared by: Donavon Fudge  Exercises - Seated Upper Trapezius Stretch  - 2 x daily - 7 x weekly - 2 reps - 15 hold - Seated Levator Scapulae Stretch  - 2 x daily - 7 x weekly - 2 reps - 15 hold - Median Nerve Flossing - Tray  - 2 x daily - 7 x weekly - 2 sets - 10 reps - Doorway Pec Stretch at 90 Degrees Abduction  - 2 x daily - 7 x weekly - 2 reps - 15 hold  ASSESSMENT:  CLINICAL IMPRESSION: Patient enters doing well with  some reports of numbness and tingling in L hand. She has met snort term goals and progressed towards some long term.  We worked on some postural strengthening with Tband interventions with increase tightness in both UT.  She has overacting of upper traps and needs cues to relax shoulders with almost all exercises. Positive response to MT evident by improved tissue elasticity  EVAL: Patient is a 68 y.o. female who was seen today for physical therapy evaluation and treatment for cervical radiculopathy with degenerative disease. Her symptoms started in January and has gotten progressively worse. She has discomfort radiating from the L shoulder blade to the first three fingers, associated with numbness and tingling. MRI revealed significant right-sided neuroforaminal stenosis at C5-C6 and multiple degenerative changes throughout the spine. She has been going to a chiro for 6 weeks now, 3x a week for adjustments and traction but has had no real changes. Patient presents with positive Spurling's and Distraction test. She also has some postural deficits and increased tightness in her upper traps. Patient will benefit from skilled PT  to address her pain and numbness and tingling to be able to complete ADLs without discomfort.   OBJECTIVE IMPAIRMENTS: decreased ROM, decreased strength, postural dysfunction, and pain.   PERSONAL FACTORS: Age, Behavior pattern, and Time since onset of injury/illness/exacerbation are also affecting patient's functional outcome.   REHAB POTENTIAL: Good  CLINICAL DECISION MAKING: Stable/uncomplicated  EVALUATION COMPLEXITY: Low  GOALS: Goals reviewed with patient? Yes  SHORT TERM GOALS: Target date: 02/27/24  Patient will be independent with initial HEP.  Baseline: given 01/23/24 Goal status: ongoing 01/29/24, Met 02/21/24  LONG TERM GOALS: Target date:04/02/24  Patient will be independent with advanced/ongoing HEP to improve outcomes and carryover.  Baseline:  Goal status:  INITIAL  2.  Patient will report 75% improvement in L shoulder and arm pain to improve QOL.  Baseline: 5/10 Goal status: Progressed 30% 02/21/24  3.  Patient will demonstrate full pain free cervical ROM an no pain with driving.  Baseline:  Goal status: INITIAL  4.  Patient will report decreased numbness and  tingling into her hand by 50% Baseline:  Goal status: Progressed 30% 02/21/24  5.  Patient will report decreased tightness in upper traps and cervical/neck and improved posture Baseline: tightness in UT and rounded shoulders Goal status: INITIAL   PLAN:  PT FREQUENCY: 2x/week  PT DURATION: 10 weeks  PLANNED INTERVENTIONS: 97110-Therapeutic exercises, 97530- Therapeutic activity, 97112- Neuromuscular re-education, 97535- Self Care, 16109- Manual therapy, G0283- Electrical stimulation (unattended), 97012- Traction (mechanical), Patient/Family education, Taping, Dry Needling, Joint mobilization, Spinal mobilization, Cryotherapy, and Moist heat  PLAN FOR NEXT SESSION: continue to work on postural mms, see how the DN and the traction are doing   Ollen Beverage, PTA 02/21/2024, 9:30 AM

## 2024-02-26 ENCOUNTER — Encounter: Payer: Self-pay | Admitting: Physical Therapy

## 2024-02-26 ENCOUNTER — Ambulatory Visit: Admitting: Physical Therapy

## 2024-02-26 DIAGNOSIS — M79602 Pain in left arm: Secondary | ICD-10-CM

## 2024-02-26 DIAGNOSIS — M5412 Radiculopathy, cervical region: Secondary | ICD-10-CM | POA: Diagnosis not present

## 2024-02-26 NOTE — Therapy (Signed)
 OUTPATIENT PHYSICAL THERAPY CERVICAL TREATMENT   Patient Name: Keleigh Kazee MRN: [id number] DOB:04/08/1956, 68 y.o., female Today's Date: 02/26/2024  END OF SESSION:  PT End of Session - 02/26/24 0932     Visit Number 9    Date for PT Re-Evaluation 04/02/24    PT Start Time 0932    PT Stop Time 1015    PT Time Calculation (min) 43 min    Activity Tolerance Patient tolerated treatment well    Behavior During Therapy WFL for tasks assessed/performed                  Past Medical History:  Diagnosis Date   Arthritis    Cancer (HCC)    breast CA (resolved)   Hypertension    Migraine    Past Surgical History:  Procedure Laterality Date   BREAST SURGERY     COSMETIC SURGERY     HERNIA REPAIR     REPLACEMENT TOTAL KNEE     WRIST SURGERY     Patient Active Problem List   Diagnosis Date Noted   Upper respiratory tract infection 02/19/2024   Class 2 severe obesity due to excess calories with serious comorbidity and body mass index (BMI) of 39.0 to 39.9 in adult (HCC) 01/10/2024   Vitamin D deficiency 01/10/2024   Thoracic aortic atherosclerosis (HCC) 01/10/2024   History of Helicobacter pylori infection 01/10/2024   Acute anterior lower chest wall pain 01/10/2024   Chronic gastric ulcer without hemorrhage and without perforation 01/10/2024   S/P TKR (total knee replacement), left 01/10/2024   History of bilateral breast cancer 01/10/2024   Metabolic dysfunction-associated steatotic liver disease (MASLD) 01/10/2024   Cervical radiculopathy due to degenerative joint disease of spine 01/10/2024   Antiphospholipid syndrome (HCC) 01/10/2024   OSA (obstructive sleep apnea) 06/15/2022   Dyspnea 06/15/2022   Paroxysmal SVT (supraventricular tachycardia) (HCC) 01/20/2020   Hypertension    Bradycardia     PCP: Kasandra Pain  REFERRING PROVIDER: Kasandra Pain  REFERRING DIAG:  [phone] (ICD-10-CM) - Cervical radiculopathy due to degenerative joint disease of spine     THERAPY DIAG:  Radiculopathy, cervical region  Pain in left arm  Rationale for Evaluation and Treatment: Rehabilitation  ONSET DATE: since Jan  SUBJECTIVE:                                                                                                                                                                                                         SUBJECTIVE STATEMENT: "Im ok" some tingling  Hand dominance: Right  PERTINENT HISTORY:  Cervical radiculopathy with degenerative disease--Chronic left-sided cervical radiculopathy with significant discomfort radiating from the shoulder to the fingers, associated with numbness HTN, SVT, Antiphospholipid syndrome, MASLD, class 2 obesity, sleep apnea, hx of bilateral breast cancer, hx of L TKA  PAIN:  Are you having pain? Yes: NPRS scale: 0/10 Pain location: L shoulder blade and down my arm  Pain description: numbness/tingling, dull, throbbing  Aggravating factors: I don't know but it is worse in the morning Relieving factors: sometimes when I stretch my hands up it might be better  PRECAUTIONS: None  RED FLAGS: None     WEIGHT BEARING RESTRICTIONS: No  FALLS:  Has patient fallen in last 6 months? Yes. Number of falls 1, I stumble and trip because I am clumsy   LIVING ENVIRONMENT: Lives with: lives alone Lives in: House/apartment Stairs: Yes: External: 2 steps; avoid by going in through the garage  OCCUPATION: retired  PLOF: Independent  PATIENT GOALS: I want my numbness and pain gone and drive without numbness and pain  NEXT MD VISIT: I do but I don't remember when it is   OBJECTIVE:  Note: Objective measures were completed at Evaluation unless otherwise noted.  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   COGNITION: Overall cognitive status: Within functional limits for tasks  assessed  SENSATION: WFL  POSTURE: rounded shoulders, forward head, and increased thoracic kyphosis  PALPATION: Tightness in upper traps, some trigger points    CERVICAL ROM:   Active ROM A/PROM (deg) eval AROM 02/26/24   Flexion WFL WFL  Extension WFL "just tight" WFL  Right lateral flexion 75% Limited 25%  Left lateral flexion 75% WFL  Right rotation [facility] WFL  Left rotation 75%  Limited 10%   (Blank rows = not tested)  UPPER EXTREMITY ROM: WFL   UPPER EXTREMITY MMT: overall 4/5 except abduction on both sides is weak 3+    CERVICAL SPECIAL TESTS:  Upper limb tension test (ULTT): Positive, Spurling's test: Positive, and Distraction test: Positive   TREATMENT DATE:  02/26/24 NuStep L5 x 6 min GOALS  Cervical retractions yellow band 2x10  Shoulder ER green 2x10 Horiz abd green 2x10 Shoulder Ext 5lb 2x10 Shoulder ext 5lb 2x10 Rows and Ext 5lb 2x10 Cervical traction 13lb x10 min  02/21/24 AAROM shoulder Flex, Ext, IR  3lb WaTE x10 NuStep L5 x 4 min Shoulder ER red 2x10 Cervical retractions  Horiz abd red 2x10 Seated rows green 2x15 Standing shoulder Ext green 2x10 STM to UT, and cervical spine Cervical traction 13lb x10 min  02/19/24 Standing UT stretch with 4#  Pec stretch 15s each side  UBE L3 x71mins each way  Seated row 15# 2x10  Lat pull down 20# 2x10 Chest press 5# 2x10  Shoulder ext 5# 2x10  Face pull bilaterally with red band x10 OHP yellow ball 2x10 Seated passive stretching   02/12/24 Review tendon glides for carpal tunnel Rows and ext green 2x10 UBE L3 x13mins each way Scapular retraction green 2x10 Face pull to ER red LUE 2x10 OHP yellow ball 2x10 Rotations holding ball and standing against wall x10 STM with theragun to bilateral upper traps Passive stretching on upper traps   02/07/24 NuStep L5x63mins  Shoulder ext 5# 2x10 Cable row 10# 2x10 Wall angels 2x10 Foam roll on wall 2x10  Thoracic open book standing against wall 2x10 SNAG  extension x10 Thoracic ext against half foam roll seated x10 Manual cervical traction and suboccipital  release    02/05/24 UBE L3 x71mins each way  Red band rows and ext 2x10  Scapular retraction red band 2x10 Chin tucks ball against wall 2x10 UT stretch with weight 15s x2 Pec stretch in doorway 15sx2 Static cervical traction 10# x12 mins   01/30/24 Nustep level 5 x 5 minutes Red tband Row Red tband extension with some left hand tingling Gentle cervical retraction Trigger Point Dry Needling  Initial Treatment: Pt instructed on Dry Needling rational, procedures, and possible side effects. Pt instructed to expect mild to moderate muscle soreness later in the day and/or into the next day.  Pt instructed in methods to reduce muscle soreness. Pt instructed to continue prescribed HEP. Patient was educated on signs and symptoms of infection and other risk factors and advised to seek medical attention should they occur.  Patient verbalized understanding of these instructions and education.  Patient Verbal Consent Given: Yes Education Handout Provided: Yes Muscles Treated: left upper trap Treatment Response/Outcome: LTR STM to the left upper trap and into the left neck area Static cervical traction 12#    01/29/24 UBE L2 x89mins each way STM, c-spine paraspinal stretching, passive ROM  Cervical traction 12# x27mins   01/23/24- EVAL                                                                                                                                 PATIENT EDUCATION:  Education details: POC, HEP Person educated: Patient Education method: Explanation Education comprehension: verbalized understanding  HOME EXERCISE PROGRAM: Access Code: [email or web address] URL: [email or web address] Date: 01/23/2024 Prepared by: Donavon Fudge  Exercises - Seated Upper Trapezius Stretch  - 2 x daily - 7 x weekly - 2 reps - 15 hold - Seated Levator Scapulae Stretch  - 2 x daily - 7 x  weekly - 2 reps - 15 hold - Median Nerve Flossing - Tray  - 2 x daily - 7 x weekly - 2 sets - 10 reps - Doorway Pec Stretch at 90 Degrees Abduction  - 2 x daily - 7 x weekly - 2 reps - 15 hold  ASSESSMENT:  CLINICAL IMPRESSION: Again patient enters doing well with some reports of tingling in L hand. She has progressed increasing her cervical AROM.  Continued with postural strengthening with Tband interventions with increase tightness in both UT.  She has overacting of upper traps and needs cues to relax shoulders with almost all exercises.  EVAL: Patient is a 68 y.o. female who was seen today for physical therapy evaluation and treatment for cervical radiculopathy with degenerative disease. Her symptoms started in January and has gotten progressively worse. She has discomfort radiating from the L shoulder blade to the first three fingers, associated with numbness and tingling. MRI revealed significant right-sided neuroforaminal stenosis at C5-C6 and multiple degenerative changes throughout the spine. She has been going to a chiro for 6 weeks now, 3x a week for adjustments and traction but has  had no real changes. Patient presents with positive Spurling's and Distraction test. She also has some postural deficits and increased tightness in her upper traps. Patient will benefit from skilled PT to address her pain and numbness and tingling to be able to complete ADLs without discomfort.   OBJECTIVE IMPAIRMENTS: decreased ROM, decreased strength, postural dysfunction, and pain.   PERSONAL FACTORS: Age, Behavior pattern, and Time since onset of injury/illness/exacerbation are also affecting patient's functional outcome.   REHAB POTENTIAL: Good  CLINICAL DECISION MAKING: Stable/uncomplicated  EVALUATION COMPLEXITY: Low  GOALS: Goals reviewed with patient? Yes  SHORT TERM GOALS: Target date: 02/27/24  Patient will be independent with initial HEP.  Baseline: given 01/23/24 Goal status: ongoing  01/29/24, Met 02/21/24  LONG TERM GOALS: Target date:04/02/24  Patient will be independent with advanced/ongoing HEP to improve outcomes and carryover.  Baseline:  Goal status: INITIAL  2.  Patient will report 75% improvement in L shoulder and arm pain to improve QOL.  Baseline: 5/10 Goal status: Progressed 30% 02/21/24  3.  Patient will demonstrate full pain free cervical ROM an no pain with driving.  Baseline:  Goal status: Progressing 02/26/24  4.  Patient will report decreased numbness and  tingling into her hand by 50% Baseline:  Goal status: Progressed 30% 02/21/24  5.  Patient will report decreased tightness in upper traps and cervical/neck and improved posture Baseline: tightness in UT and rounded shoulders Goal status: Ongoing 02/26/24   PLAN:  PT FREQUENCY: 2x/week  PT DURATION: 10 weeks  PLANNED INTERVENTIONS: 97110-Therapeutic exercises, 97530- Therapeutic activity, 97112- Neuromuscular re-education, 97535- Self Care, 16109- Manual therapy, G0283- Electrical stimulation (unattended), 97012- Traction (mechanical), Patient/Family education, Taping, Dry Needling, Joint mobilization, Spinal mobilization, Cryotherapy, and Moist heat  PLAN FOR NEXT SESSION: continue to work on postural mms, see how the DN and the traction are doing   Ollen Beverage, PTA 02/26/2024, 9:32 AM

## 2024-02-27 NOTE — Therapy (Signed)
 OUTPATIENT PHYSICAL THERAPY CERVICAL TREATMENT Progress Note Reporting Period 01/23/24 to 02/28/24  See note below for Objective Data and Assessment of Progress/Goals.      Patient Name: Kirsten Choi MRN: [id number] DOB:10/22/1956, 68 y.o., female Today's Date: 02/28/2024  END OF SESSION:  PT End of Session - 02/28/24 0928     Visit Number 10    Date for PT Re-Evaluation 04/02/24    PT Start Time 0930    PT Stop Time 1015    PT Time Calculation (min) 45 min    Activity Tolerance Patient tolerated treatment well    Behavior During Therapy WFL for tasks assessed/performed                   Past Medical History:  Diagnosis Date   Arthritis    Cancer (HCC)    breast CA (resolved)   Hypertension    Migraine    Past Surgical History:  Procedure Laterality Date   BREAST SURGERY     COSMETIC SURGERY     HERNIA REPAIR     REPLACEMENT TOTAL KNEE     WRIST SURGERY     Patient Active Problem List   Diagnosis Date Noted   Upper respiratory tract infection 02/19/2024   Class 2 severe obesity due to excess calories with serious comorbidity and body mass index (BMI) of 39.0 to 39.9 in adult (HCC) 01/10/2024   Vitamin D deficiency 01/10/2024   Thoracic aortic atherosclerosis (HCC) 01/10/2024   History of Helicobacter pylori infection 01/10/2024   Acute anterior lower chest wall pain 01/10/2024   Chronic gastric ulcer without hemorrhage and without perforation 01/10/2024   S/P TKR (total knee replacement), left 01/10/2024   History of bilateral breast cancer 01/10/2024   Metabolic dysfunction-associated steatotic liver disease (MASLD) 01/10/2024   Cervical radiculopathy due to degenerative joint disease of spine 01/10/2024   Antiphospholipid syndrome (HCC) 01/10/2024   OSA (obstructive sleep apnea) 06/15/2022   Dyspnea 06/15/2022   Paroxysmal SVT (supraventricular tachycardia) (HCC) 01/20/2020   Hypertension    Bradycardia     PCP: Kasandra Pain  REFERRING  PROVIDER: Kasandra Pain  REFERRING DIAG:  [phone] (ICD-10-CM) - Cervical radiculopathy due to degenerative joint disease of spine    THERAPY DIAG:  Radiculopathy, cervical region  Pain in left arm  Rationale for Evaluation and Treatment: Rehabilitation  ONSET DATE: since Jan  SUBJECTIVE:  SUBJECTIVE STATEMENT: Still tingling in the hand but is less frequent   Hand dominance: Right  PERTINENT HISTORY:  Cervical radiculopathy with degenerative disease--Chronic left-sided cervical radiculopathy with significant discomfort radiating from the shoulder to the fingers, associated with numbness HTN, SVT, Antiphospholipid syndrome, MASLD, class 2 obesity, sleep apnea, hx of bilateral breast cancer, hx of L TKA  PAIN:  Are you having pain? Yes: NPRS scale: 0/10 Pain location: L shoulder blade and down my arm  Pain description: numbness/tingling, dull, throbbing  Aggravating factors: I don't know but it is worse in the morning Relieving factors: sometimes when I stretch my hands up it might be better  PRECAUTIONS: None  RED FLAGS: None     WEIGHT BEARING RESTRICTIONS: No  FALLS:  Has patient fallen in last 6 months? Yes. Number of falls 1, I stumble and trip because I am clumsy   LIVING ENVIRONMENT: Lives with: lives alone Lives in: House/apartment Stairs: Yes: External: 2 steps; avoid by going in through the garage  OCCUPATION: retired  PLOF: Independent  PATIENT GOALS: I want my numbness and pain gone and drive without numbness and pain  NEXT MD VISIT: I do but I don't remember when it is   OBJECTIVE:  Note: Objective measures were completed at Evaluation unless otherwise noted.  DIAGNOSTIC FINDINGS:  MRI revealed significant right-sided neuroforaminal stenosis at C5-C6  and multiple degenerative changes throughout the spine.   COGNITION: Overall cognitive status: Within functional limits for tasks assessed  SENSATION: WFL  POSTURE: rounded shoulders, forward head, and increased thoracic kyphosis  PALPATION: Tightness in upper traps, some trigger points    CERVICAL ROM:   Active ROM A/PROM (deg) eval AROM 02/26/24   Flexion WFL WFL  Extension WFL "just tight" WFL  Right lateral flexion 75% Limited 25%  Left lateral flexion 75% WFL  Right rotation [facility] WFL  Left rotation 75%  Limited 10%   (Blank rows = not tested)  UPPER EXTREMITY ROM: WFL   UPPER EXTREMITY MMT: overall 4/5 except abduction on both sides is weak 3+    CERVICAL SPECIAL TESTS:  Upper limb tension test (ULTT): Positive, Spurling's test: Positive, and Distraction test: Positive   TREATMENT DATE:  02/28/24 UBE L2 x63mins each way  Cervical retractions yellow 2x10 Standing rows and ext green band 2x10 Chin tuck with shoulder flexion 2x10  Chin tuck with scapular retraction red 2x10  Seated STM and passive stretching of neck Cervical traction 12lb x10 min  02/26/24 NuStep L5 x 6 min GOALS  Cervical retractions yellow band 2x10  Shoulder ER green 2x10 Horiz abd green 2x10 Shoulder Ext 5lb 2x10 Shoulder ext 5lb 2x10 Rows and Ext 5lb 2x10 Cervical traction 13lb x10 min  02/21/24 AAROM shoulder Flex, Ext, IR  3lb WaTE x10 NuStep L5 x 4 min Shoulder ER red 2x10 Cervical retractions  Horiz abd red 2x10 Seated rows green 2x15 Standing shoulder Ext green 2x10 STM to UT, and cervical spine Cervical traction 13lb x10 min  02/19/24 Standing UT stretch with 4#  Pec stretch 15s each side  UBE L3 x70mins each way  Seated row 15# 2x10  Lat pull down 20# 2x10 Chest press 5# 2x10  Shoulder ext 5# 2x10  Face pull bilaterally with red band x10 OHP yellow ball 2x10 Seated passive stretching   02/12/24 Review tendon glides for carpal tunnel Rows and ext green 2x10 UBE L3  x9mins each way Scapular retraction green 2x10 Face pull to ER red LUE 2x10 OHP yellow ball 2x10  Rotations holding ball and standing against wall x10 STM with theragun to bilateral upper traps Passive stretching on upper traps   02/07/24 NuStep L5x44mins  Shoulder ext 5# 2x10 Cable row 10# 2x10 Wall angels 2x10 Foam roll on wall 2x10  Thoracic open book standing against wall 2x10 SNAG extension x10 Thoracic ext against half foam roll seated x10 Manual cervical traction and suboccipital release    02/05/24 UBE L3 x16mins each way  Red band rows and ext 2x10  Scapular retraction red band 2x10 Chin tucks ball against wall 2x10 UT stretch with weight 15s x2 Pec stretch in doorway 15sx2 Static cervical traction 10# x12 mins   01/30/24 Nustep level 5 x 5 minutes Red tband Row Red tband extension with some left hand tingling Gentle cervical retraction Trigger Point Dry Needling  Initial Treatment: Pt instructed on Dry Needling rational, procedures, and possible side effects. Pt instructed to expect mild to moderate muscle soreness later in the day and/or into the next day.  Pt instructed in methods to reduce muscle soreness. Pt instructed to continue prescribed HEP. Patient was educated on signs and symptoms of infection and other risk factors and advised to seek medical attention should they occur.  Patient verbalized understanding of these instructions and education.  Patient Verbal Consent Given: Yes Education Handout Provided: Yes Muscles Treated: left upper trap Treatment Response/Outcome: LTR STM to the left upper trap and into the left neck area Static cervical traction 12#    01/29/24 UBE L2 x22mins each way STM, c-spine paraspinal stretching, passive ROM  Cervical traction 12# x34mins   01/23/24- EVAL                                                                                                                                 PATIENT EDUCATION:  Education details:  POC, HEP Person educated: Patient Education method: Explanation Education comprehension: verbalized understanding  HOME EXERCISE PROGRAM: Access Code: [email or web address] URL: [email or web address] Date: 01/23/2024 Prepared by: Donavon Fudge  Exercises - Seated Upper Trapezius Stretch  - 2 x daily - 7 x weekly - 2 reps - 15 hold - Seated Levator Scapulae Stretch  - 2 x daily - 7 x weekly - 2 reps - 15 hold - Median Nerve Flossing - Tray  - 2 x daily - 7 x weekly - 2 sets - 10 reps - Doorway Pec Stretch at 90 Degrees Abduction  - 2 x daily - 7 x weekly - 2 reps - 15 hold  ASSESSMENT:  CLINICAL IMPRESSION: She has progressed some towards her goals. Continued with postural strengthening with Tband interventions with increase tightness in both UT. She reports that the tingling is still ongoing but she can do some stretches and nerve glides that typically will help it go away for a short period of time. Pt also reports that the tingling is happening less often. Some pain in low back with horizontal abduction with band so modified to  do scapular retraction instead. Another trial of cervical traction done today.   EVAL: Patient is a 68 y.o. female who was seen today for physical therapy evaluation and treatment for cervical radiculopathy with degenerative disease. Her symptoms started in January and has gotten progressively worse. She has discomfort radiating from the L shoulder blade to the first three fingers, associated with numbness and tingling. MRI revealed significant right-sided neuroforaminal stenosis at C5-C6 and multiple degenerative changes throughout the spine. She has been going to a chiro for 6 weeks now, 3x a week for adjustments and traction but has had no real changes. Patient presents with positive Spurling's and Distraction test. She also has some postural deficits and increased tightness in her upper traps. Patient will benefit from skilled PT to address her pain and numbness  and tingling to be able to complete ADLs without discomfort.   OBJECTIVE IMPAIRMENTS: decreased ROM, decreased strength, postural dysfunction, and pain.   PERSONAL FACTORS: Age, Behavior pattern, and Time since onset of injury/illness/exacerbation are also affecting patient's functional outcome.   REHAB POTENTIAL: Good  CLINICAL DECISION MAKING: Stable/uncomplicated  EVALUATION COMPLEXITY: Low  GOALS: Goals reviewed with patient? Yes  SHORT TERM GOALS: Target date: 02/27/24  Patient will be independent with initial HEP.  Baseline: given 01/23/24 Goal status: ongoing 01/29/24, Met 02/21/24  LONG TERM GOALS: Target date:04/02/24  Patient will be independent with advanced/ongoing HEP to improve outcomes and carryover.  Baseline:  Goal status: INITIAL  2.  Patient will report 75% improvement in L shoulder and arm pain to improve QOL.  Baseline: 5/10 Goal status: Progressed 30% 02/28/24  3.  Patient will demonstrate full pain free cervical ROM an no pain with driving.  Baseline:  Goal status: Progressing 02/28/24  4.  Patient will report decreased numbness and  tingling into her hand by 50% Baseline:  Goal status: Progressed 30% 02/28/24  5.  Patient will report decreased tightness in upper traps and cervical/neck and improved posture Baseline: tightness in UT and rounded shoulders Goal status: Ongoing 02/28/24   PLAN:  PT FREQUENCY: 2x/week  PT DURATION: 10 weeks  PLANNED INTERVENTIONS: 97110-Therapeutic exercises, 97530- Therapeutic activity, 97112- Neuromuscular re-education, 97535- Self Care, 16109- Manual therapy, G0283- Electrical stimulation (unattended), 97012- Traction (mechanical), Patient/Family education, Taping, Dry Needling, Joint mobilization, Spinal mobilization, Cryotherapy, and Moist heat  PLAN FOR NEXT SESSION: continue to work on postural mms, see how the DN and the traction are doing   Donavon Fudge, PT 02/28/2024, 10:16 AM

## 2024-02-28 ENCOUNTER — Ambulatory Visit

## 2024-02-28 DIAGNOSIS — M5412 Radiculopathy, cervical region: Secondary | ICD-10-CM

## 2024-02-28 DIAGNOSIS — M79602 Pain in left arm: Secondary | ICD-10-CM

## 2024-03-04 ENCOUNTER — Encounter: Payer: Self-pay | Admitting: Physical Therapy

## 2024-03-04 ENCOUNTER — Ambulatory Visit: Admitting: Physical Therapy

## 2024-03-04 DIAGNOSIS — M5412 Radiculopathy, cervical region: Secondary | ICD-10-CM

## 2024-03-04 DIAGNOSIS — M79602 Pain in left arm: Secondary | ICD-10-CM

## 2024-03-04 NOTE — Therapy (Signed)
 OUTPATIENT PHYSICAL THERAPY CERVICAL TREATMENT    Patient Name: Kirsten Choi MRN: [id number] DOB:10-24-1956, 68 y.o., female Today's Date: 03/04/2024  END OF SESSION:  PT End of Session - 03/04/24 1425     Visit Number 11    Date for PT Re-Evaluation 04/02/24    PT Start Time 1430    PT Stop Time 1515    PT Time Calculation (min) 45 min    Activity Tolerance Patient tolerated treatment well    Behavior During Therapy WFL for tasks assessed/performed                Past Medical History:  Diagnosis Date   Arthritis    Cancer (HCC)    breast CA (resolved)   Hypertension    Migraine    Past Surgical History:  Procedure Laterality Date   BREAST SURGERY     COSMETIC SURGERY     HERNIA REPAIR     REPLACEMENT TOTAL KNEE     WRIST SURGERY     Patient Active Problem List   Diagnosis Date Noted   Upper respiratory tract infection 02/19/2024   Class 2 severe obesity due to excess calories with serious comorbidity and body mass index (BMI) of 39.0 to 39.9 in adult (HCC) 01/10/2024   Vitamin D deficiency 01/10/2024   Thoracic aortic atherosclerosis (HCC) 01/10/2024   History of Helicobacter pylori infection 01/10/2024   Acute anterior lower chest wall pain 01/10/2024   Chronic gastric ulcer without hemorrhage and without perforation 01/10/2024   S/P TKR (total knee replacement), left 01/10/2024   History of bilateral breast cancer 01/10/2024   Metabolic dysfunction-associated steatotic liver disease (MASLD) 01/10/2024   Cervical radiculopathy due to degenerative joint disease of spine 01/10/2024   Antiphospholipid syndrome (HCC) 01/10/2024   OSA (obstructive sleep apnea) 06/15/2022   Dyspnea 06/15/2022   Paroxysmal SVT (supraventricular tachycardia) (HCC) 01/20/2020   Hypertension    Bradycardia     PCP: Kasandra Pain  REFERRING PROVIDER: Kasandra Pain  REFERRING DIAG:  [phone] (ICD-10-CM) - Cervical radiculopathy due to degenerative joint disease of spine     THERAPY DIAG:  Radiculopathy, cervical region  Pain in left arm  Rationale for Evaluation and Treatment: Rehabilitation  ONSET DATE: since Jan  SUBJECTIVE:                                                                                                                                                                                                         SUBJECTIVE STATEMENT: Tight in the upper shoulders  Hand dominance: Right  PERTINENT HISTORY:  Cervical radiculopathy with degenerative disease--Chronic left-sided cervical radiculopathy with significant discomfort radiating from the shoulder to the fingers, associated with numbness HTN, SVT, Antiphospholipid syndrome, MASLD, class 2 obesity, sleep apnea, hx of bilateral breast cancer, hx of L TKA  PAIN:  Are you having pain? Yes: NPRS scale: 0/10 Pain location: L shoulder blade and down my arm  Pain description: numbness/tingling, dull, throbbing  Aggravating factors: I don't know but it is worse in the morning Relieving factors: sometimes when I stretch my hands up it might be better  PRECAUTIONS: None  RED FLAGS: None     WEIGHT BEARING RESTRICTIONS: No  FALLS:  Has patient fallen in last 6 months? Yes. Number of falls 1, I stumble and trip because I am clumsy   LIVING ENVIRONMENT: Lives with: lives alone Lives in: House/apartment Stairs: Yes: External: 2 steps; avoid by going in through the garage  OCCUPATION: retired  PLOF: Independent  PATIENT GOALS: I want my numbness and pain gone and drive without numbness and pain  NEXT MD VISIT: I do but I don't remember when it is   OBJECTIVE:  Note: Objective measures were completed at Evaluation unless otherwise noted.  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   COGNITION: Overall cognitive status: Within functional limits for tasks  assessed  SENSATION: WFL  POSTURE: rounded shoulders, forward head, and increased thoracic kyphosis  PALPATION: Tightness in upper traps, some trigger points    CERVICAL ROM:   Active ROM A/PROM (deg) eval AROM 02/26/24   Flexion WFL WFL  Extension WFL "just tight" WFL  Right lateral flexion 75% Limited 25%  Left lateral flexion 75% WFL  Right rotation [facility] WFL  Left rotation 75%  Limited 10%   (Blank rows = not tested)  UPPER EXTREMITY ROM: WFL   UPPER EXTREMITY MMT: overall 4/5 except abduction on both sides is weak 3+    CERVICAL SPECIAL TESTS:  Upper limb tension test (ULTT): Positive, Spurling's test: Positive, and Distraction test: Positive   TREATMENT DATE:  03/04/24 NuStep L 5 x7 min Rows & Lats 2lb 210 STM to UT and cervical spine Shoulder Ext green 2x10 Cervical retractions yellow 2x10 Cervical traction 12lb x10 min  02/28/24 UBE L2 x49mins each way  Cervical retractions yellow 2x10 Standing rows and ext green band 2x10 Chin tuck with shoulder flexion 2x10  Chin tuck with scapular retraction red 2x10  Seated STM and passive stretching of neck Cervical traction 12lb x10 min  02/26/24 NuStep L5 x 6 min GOALS  Cervical retractions yellow band 2x10  Shoulder ER green 2x10 Horiz abd green 2x10 Shoulder Ext 5lb 2x10 Shoulder ext 5lb 2x10 Rows and Ext 5lb 2x10 Cervical traction 13lb x10 min  02/21/24 AAROM shoulder Flex, Ext, IR  3lb WaTE x10 NuStep L5 x 4 min Shoulder ER red 2x10 Cervical retractions  Horiz abd red 2x10 Seated rows green 2x15 Standing shoulder Ext green 2x10 STM to UT, and cervical spine Cervical traction 13lb x10 min  02/19/24 Standing UT stretch with 4#  Pec stretch 15s each side  UBE L3 x5mins each way  Seated row 15# 2x10  Lat pull down 20# 2x10 Chest press 5# 2x10  Shoulder ext 5# 2x10  Face pull bilaterally with red band x10 OHP yellow ball 2x10 Seated passive stretching   02/12/24 Review tendon glides for carpal  tunnel Rows and ext green 2x10 UBE L3 x69mins each way Scapular retraction green 2x10  Face pull to ER red LUE 2x10 OHP yellow ball 2x10 Rotations holding ball and standing against wall x10 STM with theragun to bilateral upper traps Passive stretching on upper traps   02/07/24 NuStep L5x42mins  Shoulder ext 5# 2x10 Cable row 10# 2x10 Wall angels 2x10 Foam roll on wall 2x10  Thoracic open book standing against wall 2x10 SNAG extension x10 Thoracic ext against half foam roll seated x10 Manual cervical traction and suboccipital release    02/05/24 UBE L3 x24mins each way  Red band rows and ext 2x10  Scapular retraction red band 2x10 Chin tucks ball against wall 2x10 UT stretch with weight 15s x2 Pec stretch in doorway 15sx2 Static cervical traction 10# x12 mins   01/30/24 Nustep level 5 x 5 minutes Red tband Row Red tband extension with some left hand tingling Gentle cervical retraction Trigger Point Dry Needling  Initial Treatment: Pt instructed on Dry Needling rational, procedures, and possible side effects. Pt instructed to expect mild to moderate muscle soreness later in the day and/or into the next day.  Pt instructed in methods to reduce muscle soreness. Pt instructed to continue prescribed HEP. Patient was educated on signs and symptoms of infection and other risk factors and advised to seek medical attention should they occur.  Patient verbalized understanding of these instructions and education.  Patient Verbal Consent Given: Yes Education Handout Provided: Yes Muscles Treated: left upper trap Treatment Response/Outcome: LTR STM to the left upper trap and into the left neck area Static cervical traction 12#    01/29/24 UBE L2 x42mins each way STM, c-spine paraspinal stretching, passive ROM  Cervical traction 12# x67mins   01/23/24- EVAL                                                                                                                                  PATIENT EDUCATION:  Education details: POC, HEP Person educated: Patient Education method: Explanation Education comprehension: verbalized understanding  HOME EXERCISE PROGRAM: Access Code: [email or web address] URL: [email or web address] Date: 01/23/2024 Prepared by: Donavon Fudge  Exercises - Seated Upper Trapezius Stretch  - 2 x daily - 7 x weekly - 2 reps - 15 hold - Seated Levator Scapulae Stretch  - 2 x daily - 7 x weekly - 2 reps - 15 hold - Median Nerve Flossing - Tray  - 2 x daily - 7 x weekly - 2 sets - 10 reps - Doorway Pec Stretch at 90 Degrees Abduction  - 2 x daily - 7 x weekly - 2 reps - 15 hold  ASSESSMENT:  CLINICAL IMPRESSION: Pt enters reporting a decrease with her overall numbness and tingling. Continued with postural strengthening with the use of machine level interventions.  Pt reports she could really feel it with resisted cervical retractions. Tactile cues needed to squeeze shoulder blades together with ER. Continued with  cervical traction  today.   EVAL: Patient is a 68 y.o. female who was  seen today for physical therapy evaluation and treatment for cervical radiculopathy with degenerative disease. Her symptoms started in January and has gotten progressively worse. She has discomfort radiating from the L shoulder blade to the first three fingers, associated with numbness and tingling. MRI revealed significant right-sided neuroforaminal stenosis at C5-C6 and multiple degenerative changes throughout the spine. She has been going to a chiro for 6 weeks now, 3x a week for adjustments and traction but has had no real changes. Patient presents with positive Spurling's and Distraction test. She also has some postural deficits and increased tightness in her upper traps. Patient will benefit from skilled PT to address her pain and numbness and tingling to be able to complete ADLs without discomfort.   OBJECTIVE IMPAIRMENTS: decreased ROM, decreased strength, postural  dysfunction, and pain.   PERSONAL FACTORS: Age, Behavior pattern, and Time since onset of injury/illness/exacerbation are also affecting patient's functional outcome.   REHAB POTENTIAL: Good  CLINICAL DECISION MAKING: Stable/uncomplicated  EVALUATION COMPLEXITY: Low  GOALS: Goals reviewed with patient? Yes  SHORT TERM GOALS: Target date: 02/27/24  Patient will be independent with initial HEP.  Baseline: given 01/23/24 Goal status: ongoing 01/29/24, Met 02/21/24  LONG TERM GOALS: Target date:04/02/24  Patient will be independent with advanced/ongoing HEP to improve outcomes and carryover.  Baseline:  Goal status: INITIAL  2.  Patient will report 75% improvement in L shoulder and arm pain to improve QOL.  Baseline: 5/10 Goal status: Progressed 30% 02/28/24  3.  Patient will demonstrate full pain free cervical ROM an no pain with driving.  Baseline:  Goal status: Progressing 02/28/24  4.  Patient will report decreased numbness and  tingling into her hand by 50% Baseline:  Goal status: Progressed 30% 02/28/24  5.  Patient will report decreased tightness in upper traps and cervical/neck and improved posture Baseline: tightness in UT and rounded shoulders Goal status: Ongoing 02/28/24   PLAN:  PT FREQUENCY: 2x/week  PT DURATION: 10 weeks  PLANNED INTERVENTIONS: 97110-Therapeutic exercises, 97530- Therapeutic activity, 97112- Neuromuscular re-education, 97535- Self Care, 16109- Manual therapy, G0283- Electrical stimulation (unattended), 97012- Traction (mechanical), Patient/Family education, Taping, Dry Needling, Joint mobilization, Spinal mobilization, Cryotherapy, and Moist heat  PLAN FOR NEXT SESSION: continue to work on postural mms, see how the DN and the traction are doing   Ollen Beverage, PTA 03/04/2024, 2:25 PM

## 2024-03-05 NOTE — Therapy (Signed)
 OUTPATIENT PHYSICAL THERAPY CERVICAL TREATMENT    Patient Name: Kirsten Choi MRN: [id number] DOB:[birth year], 68 y.o., female Today's Date: 03/06/2024  END OF SESSION:  PT End of Session - 03/06/24 0930     Visit Number 12    Date for PT Re-Evaluation 04/02/24    PT Start Time 0930    PT Stop Time 1015    PT Time Calculation (min) 45 min    Activity Tolerance Patient tolerated treatment well    Behavior During Therapy WFL for tasks assessed/performed                 Past Medical History:  Diagnosis Date   Arthritis    Cancer (HCC)    breast CA (resolved)   Hypertension    Migraine    Past Surgical History:  Procedure Laterality Date   BREAST SURGERY     COSMETIC SURGERY     HERNIA REPAIR     REPLACEMENT TOTAL KNEE     WRIST SURGERY     Patient Active Problem List   Diagnosis Date Noted   Upper respiratory tract infection 02/19/2024   Class 2 severe obesity due to excess calories with serious comorbidity and body mass index (BMI) of 39.0 to 39.9 in adult (HCC) 01/10/2024   Vitamin D deficiency 01/10/2024   Thoracic aortic atherosclerosis (HCC) 01/10/2024   History of Helicobacter pylori infection 01/10/2024   Acute anterior lower chest wall pain 01/10/2024   Chronic gastric ulcer without hemorrhage and without perforation 01/10/2024   S/P TKR (total knee replacement), left 01/10/2024   History of bilateral breast cancer 01/10/2024   Metabolic dysfunction-associated steatotic liver disease (MASLD) 01/10/2024   Cervical radiculopathy due to degenerative joint disease of spine 01/10/2024   Antiphospholipid syndrome (HCC) 01/10/2024   OSA (obstructive sleep apnea) 06/15/2022   Dyspnea 06/15/2022   Paroxysmal SVT (supraventricular tachycardia) (HCC) 01/20/2020   Hypertension    Bradycardia     PCP: Kasandra Pain  REFERRING PROVIDER: Kasandra Pain  REFERRING DIAG:  [phone] (ICD-10-CM) - Cervical radiculopathy due to degenerative joint disease of spine     THERAPY DIAG:  Radiculopathy, cervical region  Pain in left arm  Rationale for Evaluation and Treatment: Rehabilitation  ONSET DATE: since Jan  SUBJECTIVE:                                                                                                                                                                                                         SUBJECTIVE STATEMENT: A little tingly this morning, there have been times where I don't  feel any tingling so I think it is getting better.   Hand dominance: Right  PERTINENT HISTORY:  Cervical radiculopathy with degenerative disease--Chronic left-sided cervical radiculopathy with significant discomfort radiating from the shoulder to the fingers, associated with numbness HTN, SVT, Antiphospholipid syndrome, MASLD, class 2 obesity, sleep apnea, hx of bilateral breast cancer, hx of L TKA  PAIN:  Are you having pain? Yes: NPRS scale: 0/10 Pain location: L shoulder blade and down my arm  Pain description: numbness/tingling, dull, throbbing  Aggravating factors: I don't know but it is worse in the morning Relieving factors: sometimes when I stretch my hands up it might be better  PRECAUTIONS: None  RED FLAGS: None     WEIGHT BEARING RESTRICTIONS: No  FALLS:  Has patient fallen in last 6 months? Yes. Number of falls 1, I stumble and trip because I am clumsy   LIVING ENVIRONMENT: Lives with: lives alone Lives in: House/apartment Stairs: Yes: External: 2 steps; avoid by going in through the garage  OCCUPATION: retired  PLOF: Independent  PATIENT GOALS: I want my numbness and pain gone and drive without numbness and pain  NEXT MD VISIT: I do but I don't remember when it is   OBJECTIVE:  Note: Objective measures were completed at Evaluation unless otherwise noted.  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    COGNITION: Overall cognitive status: Within functional limits for tasks assessed  SENSATION: WFL  POSTURE: rounded shoulders, forward head, and increased thoracic kyphosis  PALPATION: Tightness in upper traps, some trigger points    CERVICAL ROM:   Active ROM A/PROM (deg) eval AROM 02/26/24   Flexion WFL WFL  Extension WFL "just tight" WFL  Right lateral flexion 75% Limited 25%  Left lateral flexion 75% WFL  Right rotation [facility] WFL  Left rotation 75%  Limited 10%   (Blank rows = not tested)  UPPER EXTREMITY ROM: WFL   UPPER EXTREMITY MMT: overall 4/5 except abduction on both sides is weak 3+    CERVICAL SPECIAL TESTS:  Upper limb tension test (ULTT): Positive, Spurling's test: Positive, and Distraction test: Positive   TREATMENT DATE:  03/06/24 UBE L3 x43mins each way Seated rows and lats 20# 2x10 Chest press 5# 2x10 Shoulder ext 5# 2x10 Pec stretch 15s x2 Chin tuck against ball on wall with 3s hold x10 Seated STM and passive stretching of neck Cervical traction 12lb x10 min   03/04/24 NuStep L 5 x7 min Rows & Lats 2lb 210 STM to UT and cervical spine Shoulder Ext green 2x10 Cervical retractions yellow 2x10 Cervical traction 12lb x10 min  02/28/24 UBE L2 x64mins each way  Cervical retractions yellow 2x10 Standing rows and ext green band 2x10 Chin tuck with shoulder flexion 2x10  Chin tuck with scapular retraction red 2x10  Seated STM and passive stretching of neck Cervical traction 12lb x10 min  02/26/24 NuStep L5 x 6 min GOALS  Cervical retractions yellow band 2x10  Shoulder ER green 2x10 Horiz abd green 2x10 Shoulder Ext 5lb 2x10 Shoulder ext 5lb 2x10 Rows and Ext 5lb 2x10 Cervical traction 13lb x10 min  02/21/24 AAROM shoulder Flex, Ext, IR  3lb WaTE x10 NuStep L5 x 4 min Shoulder ER red 2x10 Cervical retractions  Horiz abd red 2x10 Seated rows green 2x15 Standing shoulder Ext green 2x10 STM to UT, and cervical spine Cervical traction  13lb x10 min  02/19/24 Standing UT stretch with 4#  Pec stretch  15s each side  UBE L3 x46mins each way  Seated row 15# 2x10  Lat pull down 20# 2x10 Chest press 5# 2x10  Shoulder ext 5# 2x10  Face pull bilaterally with red band x10 OHP yellow ball 2x10 Seated passive stretching   02/12/24 Review tendon glides for carpal tunnel Rows and ext green 2x10 UBE L3 x14mins each way Scapular retraction green 2x10 Face pull to ER red LUE 2x10 OHP yellow ball 2x10 Rotations holding ball and standing against wall x10 STM with theragun to bilateral upper traps Passive stretching on upper traps   02/07/24 NuStep L5x40mins  Shoulder ext 5# 2x10 Cable row 10# 2x10 Wall angels 2x10 Foam roll on wall 2x10  Thoracic open book standing against wall 2x10 SNAG extension x10 Thoracic ext against half foam roll seated x10 Manual cervical traction and suboccipital release    02/05/24 UBE L3 x62mins each way  Red band rows and ext 2x10  Scapular retraction red band 2x10 Chin tucks ball against wall 2x10 UT stretch with weight 15s x2 Pec stretch in doorway 15sx2 Static cervical traction 10# x12 mins   01/30/24 Nustep level 5 x 5 minutes Red tband Row Red tband extension with some left hand tingling Gentle cervical retraction Trigger Point Dry Needling  Initial Treatment: Pt instructed on Dry Needling rational, procedures, and possible side effects. Pt instructed to expect mild to moderate muscle soreness later in the day and/or into the next day.  Pt instructed in methods to reduce muscle soreness. Pt instructed to continue prescribed HEP. Patient was educated on signs and symptoms of infection and other risk factors and advised to seek medical attention should they occur.  Patient verbalized understanding of these instructions and education.  Patient Verbal Consent Given: Yes Education Handout Provided: Yes Muscles Treated: left upper trap Treatment Response/Outcome: LTR STM to the left  upper trap and into the left neck area Static cervical traction 12#    01/29/24 UBE L2 x34mins each way STM, c-spine paraspinal stretching, passive ROM  Cervical traction 12# x40mins   01/23/24- EVAL                                                                                                                                 PATIENT EDUCATION:  Education details: POC, HEP Person educated: Patient Education method: Explanation Education comprehension: verbalized understanding  HOME EXERCISE PROGRAM: Access Code: [email or web address] URL: [email or web address] Date: 01/23/2024 Prepared by: Donavon Fudge  Exercises - Seated Upper Trapezius Stretch  - 2 x daily - 7 x weekly - 2 reps - 15 hold - Seated Levator Scapulae Stretch  - 2 x daily - 7 x weekly - 2 reps - 15 hold - Median Nerve Flossing - Tray  - 2 x daily - 7 x weekly - 2 sets - 10 reps - Doorway Pec Stretch at 90 Degrees Abduction  - 2 x daily - 7 x weekly - 2 reps - 15 hold  ASSESSMENT:  CLINICAL IMPRESSION: Pt enters reporting a decrease with her overall numbness and tingling and that she felt better after last visit. Continued with postural strengthening with the use of machine level interventions. Reports chin tucks were the most difficult to do. Continued with  cervical traction today.   EVAL: Patient is a 68 y.o. female who was seen today for physical therapy evaluation and treatment for cervical radiculopathy with degenerative disease. Her symptoms started in January and has gotten progressively worse. She has discomfort radiating from the L shoulder blade to the first three fingers, associated with numbness and tingling. MRI revealed significant right-sided neuroforaminal stenosis at C5-C6 and multiple degenerative changes throughout the spine. She has been going to a chiro for 6 weeks now, 3x a week for adjustments and traction but has had no real changes. Patient presents with positive Spurling's and Distraction  test. She also has some postural deficits and increased tightness in her upper traps. Patient will benefit from skilled PT to address her pain and numbness and tingling to be able to complete ADLs without discomfort.   OBJECTIVE IMPAIRMENTS: decreased ROM, decreased strength, postural dysfunction, and pain.   PERSONAL FACTORS: Age, Behavior pattern, and Time since onset of injury/illness/exacerbation are also affecting patient's functional outcome.   REHAB POTENTIAL: Good  CLINICAL DECISION MAKING: Stable/uncomplicated  EVALUATION COMPLEXITY: Low  GOALS: Goals reviewed with patient? Yes  SHORT TERM GOALS: Target date: 02/27/24  Patient will be independent with initial HEP.  Baseline: given 01/23/24 Goal status: ongoing 01/29/24, Met 02/21/24  LONG TERM GOALS: Target date:04/02/24  Patient will be independent with advanced/ongoing HEP to improve outcomes and carryover.  Baseline:  Goal status: INITIAL  2.  Patient will report 75% improvement in L shoulder and arm pain to improve QOL.  Baseline: 5/10 Goal status: Progressed 30% 02/28/24  3.  Patient will demonstrate full pain free cervical ROM an no pain with driving.  Baseline:  Goal status: Progressing 02/28/24  4.  Patient will report decreased numbness and  tingling into her hand by 50% Baseline:  Goal status: Progressed 30% 02/28/24  5.  Patient will report decreased tightness in upper traps and cervical/neck and improved posture Baseline: tightness in UT and rounded shoulders Goal status: Ongoing 02/28/24   PLAN:  PT FREQUENCY: 2x/week  PT DURATION: 10 weeks  PLANNED INTERVENTIONS: 97110-Therapeutic exercises, 97530- Therapeutic activity, 97112- Neuromuscular re-education, 97535- Self Care, 66440- Manual therapy, G0283- Electrical stimulation (unattended), 97012- Traction (mechanical), Patient/Family education, Taping, Dry Needling, Joint mobilization, Spinal mobilization, Cryotherapy, and Moist heat  PLAN FOR NEXT  SESSION: continue to work on postural mms, see how the DN and the traction are doing   Donavon Fudge, PT 03/06/2024, 10:18 AM

## 2024-03-06 ENCOUNTER — Ambulatory Visit: Attending: Family Medicine

## 2024-03-06 DIAGNOSIS — M5412 Radiculopathy, cervical region: Secondary | ICD-10-CM | POA: Insufficient documentation

## 2024-03-06 DIAGNOSIS — M79602 Pain in left arm: Secondary | ICD-10-CM | POA: Diagnosis present

## 2024-03-11 ENCOUNTER — Ambulatory Visit: Admitting: Physical Therapy

## 2024-03-11 ENCOUNTER — Encounter: Admitting: Family Medicine

## 2024-03-11 ENCOUNTER — Encounter: Payer: Self-pay | Admitting: Physical Therapy

## 2024-03-11 DIAGNOSIS — M79602 Pain in left arm: Secondary | ICD-10-CM

## 2024-03-11 DIAGNOSIS — M5412 Radiculopathy, cervical region: Secondary | ICD-10-CM | POA: Diagnosis not present

## 2024-03-11 NOTE — Therapy (Signed)
 OUTPATIENT PHYSICAL THERAPY CERVICAL TREATMENT    Patient Name: Kirsten Choi MRN: [id number] DOB:[birth year], 68 y.o., female Today's Date: 03/11/2024  END OF SESSION:  PT End of Session - 03/11/24 0929     Visit Number 13    Date for PT Re-Evaluation 04/02/24    Authorization Type Medicare    PT Start Time 0930    PT Stop Time 1015    PT Time Calculation (min) 45 min                 Past Medical History:  Diagnosis Date   Arthritis    Cancer (HCC)    breast CA (resolved)   Hypertension    Migraine    Past Surgical History:  Procedure Laterality Date   BREAST SURGERY     COSMETIC SURGERY     HERNIA REPAIR     REPLACEMENT TOTAL KNEE     WRIST SURGERY     Patient Active Problem List   Diagnosis Date Noted   Upper respiratory tract infection 02/19/2024   Class 2 severe obesity due to excess calories with serious comorbidity and body mass index (BMI) of 39.0 to 39.9 in adult (HCC) 01/10/2024   Vitamin D deficiency 01/10/2024   Thoracic aortic atherosclerosis (HCC) 01/10/2024   History of Helicobacter pylori infection 01/10/2024   Acute anterior lower chest wall pain 01/10/2024   Chronic gastric ulcer without hemorrhage and without perforation 01/10/2024   S/P TKR (total knee replacement), left 01/10/2024   History of bilateral breast cancer 01/10/2024   Metabolic dysfunction-associated steatotic liver disease (MASLD) 01/10/2024   Cervical radiculopathy due to degenerative joint disease of spine 01/10/2024   Antiphospholipid syndrome (HCC) 01/10/2024   OSA (obstructive sleep apnea) 06/15/2022   Dyspnea 06/15/2022   Paroxysmal SVT (supraventricular tachycardia) (HCC) 01/20/2020   Hypertension    Bradycardia     PCP: Kasandra Pain  REFERRING PROVIDER: Kasandra Pain  REFERRING DIAG:  [phone] (ICD-10-CM) - Cervical radiculopathy due to degenerative joint disease of spine    THERAPY DIAG:  Pain in left arm  Radiculopathy, cervical region  Rationale  for Evaluation and Treatment: Rehabilitation  ONSET DATE: since Jan  SUBJECTIVE:                                                                                                                                                                                                         SUBJECTIVE STATEMENT: No UT pain, but tingling in L arm. Sleeping on her L and driving increases tingling. She feels like PT is helping to make tingling  less constant.   Hand dominance: Right  PERTINENT HISTORY:  Cervical radiculopathy with degenerative disease--Chronic left-sided cervical radiculopathy with significant discomfort radiating from the shoulder to the fingers, associated with numbness HTN, SVT, Antiphospholipid syndrome, MASLD, class 2 obesity, sleep apnea, hx of bilateral breast cancer, hx of L TKA  PAIN:  Are you having pain? Yes: NPRS scale: 0/10 Pain location: L shoulder blade and down my arm  Pain description: numbness/tingling, dull, throbbing  Aggravating factors: I don't know but it is worse in the morning Relieving factors: sometimes when I stretch my hands up it might be better  PRECAUTIONS: None  RED FLAGS: None     WEIGHT BEARING RESTRICTIONS: No  FALLS:  Has patient fallen in last 6 months? Yes. Number of falls 1, I stumble and trip because I am clumsy   LIVING ENVIRONMENT: Lives with: lives alone Lives in: House/apartment Stairs: Yes: External: 2 steps; avoid by going in through the garage  OCCUPATION: retired  PLOF: Independent  PATIENT GOALS: I want my numbness and pain gone and drive without numbness and pain  NEXT MD VISIT: I do but I don't remember when it is   OBJECTIVE:  Note: Objective measures were completed at Evaluation unless otherwise noted.  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   COGNITION: Overall cognitive status: Within functional  limits for tasks assessed  SENSATION: WFL  POSTURE: rounded shoulders, forward head, and increased thoracic kyphosis  PALPATION: Tightness in upper traps, some trigger points    CERVICAL ROM:   Active ROM A/PROM (deg) eval AROM 02/26/24   Flexion WFL WFL  Extension WFL "just tight" WFL  Right lateral flexion 75% Limited 25%  Left lateral flexion 75% WFL  Right rotation [facility] WFL  Left rotation 75%  Limited 10%   (Blank rows = not tested)  UPPER EXTREMITY ROM: WFL   UPPER EXTREMITY MMT: overall 4/5 except abduction on both sides is weak 3+    CERVICAL SPECIAL TESTS:  Upper limb tension test (ULTT): Positive, Spurling's test: Positive, and Distraction test: Positive   TREATMENT DATE:  03/11/24 UBE L3 each way STM on UT and C spine, Theragun Green band ER x10 Red band pull apart x10  Seated rows and lats 20# 2x10 Shoulder ext  5# x10 Trigger Point Dry Needling  Subsequent Treatment: Instructions provided previously at initial dry needling treatment.   Patient Verbal Consent Given: Yes Education Handout Provided: Previously Provided Muscles Treated: upper traps Treatment Response/Outcome: LTR's DN performed and documented by Arminda Landmark, PT  03/06/24 UBE L3 x27mins each way Seated rows and lats 20# 2x10 Chest press 5# 2x10 Shoulder ext 5# 2x10 Pec stretch 15s x2 Chin tuck against ball on wall with 3s hold x10 Seated STM and passive stretching of neck Cervical traction 12lb x10 min   03/04/24 NuStep L 5 x7 min Rows & Lats 2lb 210 STM to UT and cervical spine Shoulder Ext green 2x10 Cervical retractions yellow 2x10 Cervical traction 12lb x10 min  02/28/24 UBE L2 x49mins each way  Cervical retractions yellow 2x10 Standing rows and ext green band 2x10 Chin tuck with shoulder flexion 2x10  Chin tuck with scapular retraction red 2x10  Seated STM and passive stretching of neck Cervical traction 12lb x10 min  02/26/24 NuStep L5 x 6 min GOALS  Cervical  retractions yellow band 2x10  Shoulder ER green 2x10 Horiz abd green 2x10 Shoulder Ext 5lb 2x10 Shoulder ext  5lb 2x10 Rows and Ext 5lb 2x10 Cervical traction 13lb x10 min  02/21/24 AAROM shoulder Flex, Ext, IR  3lb WaTE x10 NuStep L5 x 4 min Shoulder ER red 2x10 Cervical retractions  Horiz abd red 2x10 Seated rows green 2x15 Standing shoulder Ext green 2x10 STM to UT, and cervical spine Cervical traction 13lb x10 min  02/19/24 Standing UT stretch with 4#  Pec stretch 15s each side  UBE L3 x35mins each way  Seated row 15# 2x10  Lat pull down 20# 2x10 Chest press 5# 2x10  Shoulder ext 5# 2x10  Face pull bilaterally with red band x10 OHP yellow ball 2x10 Seated passive stretching   02/12/24 Review tendon glides for carpal tunnel Rows and ext green 2x10 UBE L3 x67mins each way Scapular retraction green 2x10 Face pull to ER red LUE 2x10 OHP yellow ball 2x10 Rotations holding ball and standing against wall x10 STM with theragun to bilateral upper traps Passive stretching on upper traps   02/07/24 NuStep L5x2mins  Shoulder ext 5# 2x10 Cable row 10# 2x10 Wall angels 2x10 Foam roll on wall 2x10  Thoracic open book standing against wall 2x10 SNAG extension x10 Thoracic ext against half foam roll seated x10 Manual cervical traction and suboccipital release    02/05/24 UBE L3 x109mins each way  Red band rows and ext 2x10  Scapular retraction red band 2x10 Chin tucks ball against wall 2x10 UT stretch with weight 15s x2 Pec stretch in doorway 15sx2 Static cervical traction 10# x12 mins   01/30/24 Nustep level 5 x 5 minutes Red tband Row Red tband extension with some left hand tingling Gentle cervical retraction Trigger Point Dry Needling  Initial Treatment: Pt instructed on Dry Needling rational, procedures, and possible side effects. Pt instructed to expect mild to moderate muscle soreness later in the day and/or into the next day.  Pt instructed in methods to reduce  muscle soreness. Pt instructed to continue prescribed HEP. Patient was educated on signs and symptoms of infection and other risk factors and advised to seek medical attention should they occur.  Patient verbalized understanding of these instructions and education.  Patient Verbal Consent Given: Yes Education Handout Provided: Yes Muscles Treated: left upper trap Treatment Response/Outcome: LTR STM to the left upper trap and into the left neck area Static cervical traction 12#    01/29/24 UBE L2 x22mins each way STM, c-spine paraspinal stretching, passive ROM  Cervical traction 12# x11mins   01/23/24- EVAL                                                                                                                                 PATIENT EDUCATION:  Education details: POC, HEP Person educated: Patient Education method: Explanation Education comprehension: verbalized understanding  HOME EXERCISE PROGRAM: Access Code: [email or web address] URL: [email or web address] Date: 01/23/2024 Prepared by: Donavon Fudge  Exercises - Seated Upper Trapezius Stretch  - 2 x daily - 7 x weekly -  2 reps - 15 hold - Seated Levator Scapulae Stretch  - 2 x daily - 7 x weekly - 2 reps - 15 hold - Median Nerve Flossing - Tray  - 2 x daily - 7 x weekly - 2 sets - 10 reps - Doorway Pec Stretch at 90 Degrees Abduction  - 2 x daily - 7 x weekly - 2 reps - 15 hold  ASSESSMENT:  CLINICAL IMPRESSION: Pt enters reporting decrease in tingling in LUE, but she still has very tight UT. She wanted to try Dn again today in hopes that it will release tension in UT. She did well with strengthening exercises, but needed to constantly stretch UT. She would benefit from further PT to address LUE tingling and UT tightness, so she can resume functional activities without limitations.   EVAL: Patient is a 68 y.o. female who was seen today for physical therapy evaluation and treatment for cervical radiculopathy with  degenerative disease. Her symptoms started in January and has gotten progressively worse. She has discomfort radiating from the L shoulder blade to the first three fingers, associated with numbness and tingling. MRI revealed significant right-sided neuroforaminal stenosis at C5-C6 and multiple degenerative changes throughout the spine. She has been going to a chiro for 6 weeks now, 3x a week for adjustments and traction but has had no real changes. Patient presents with positive Spurling's and Distraction test. She also has some postural deficits and increased tightness in her upper traps. Patient will benefit from skilled PT to address her pain and numbness and tingling to be able to complete ADLs without discomfort.   OBJECTIVE IMPAIRMENTS: decreased ROM, decreased strength, postural dysfunction, and pain.   PERSONAL FACTORS: Age, Behavior pattern, and Time since onset of injury/illness/exacerbation are also affecting patient's functional outcome.   REHAB POTENTIAL: Good  CLINICAL DECISION MAKING: Stable/uncomplicated  EVALUATION COMPLEXITY: Low  GOALS: Goals reviewed with patient? Yes  SHORT TERM GOALS: Target date: 02/27/24  Patient will be independent with initial HEP.  Baseline: given 01/23/24 Goal status: ongoing 01/29/24, Met 02/21/24  LONG TERM GOALS: Target date:04/02/24  Patient will be independent with advanced/ongoing HEP to improve outcomes and carryover.  Baseline:  Goal status: INITIAL  2.  Patient will report 75% improvement in L shoulder and arm pain to improve QOL.  Baseline: 5/10 Goal status: Progressed 30% 02/28/24  3.  Patient will demonstrate full pain free cervical ROM an no pain with driving.  Baseline:  Goal status: Progressing 02/28/24  4.  Patient will report decreased numbness and  tingling into her hand by 50% Baseline:  Goal status: Progressed 30% 02/28/24  5.  Patient will report decreased tightness in upper traps and cervical/neck and improved  posture Baseline: tightness in UT and rounded shoulders Goal status: Ongoing 02/28/24   PLAN:  PT FREQUENCY: 2x/week  PT DURATION: 10 weeks  PLANNED INTERVENTIONS: 97110-Therapeutic exercises, 97530- Therapeutic activity, 97112- Neuromuscular re-education, 97535- Self Care, 65784- Manual therapy, G0283- Electrical stimulation (unattended), 97012- Traction (mechanical), Patient/Family education, Taping, Dry Needling, Joint mobilization, Spinal mobilization, Cryotherapy, and Moist heat  PLAN FOR NEXT SESSION: continue to work on postural mms, see how the DN and the traction are doing   Laurelyn Ponder 03/11/2024, 9:30 AM

## 2024-03-13 ENCOUNTER — Ambulatory Visit: Admitting: Family Medicine

## 2024-03-13 ENCOUNTER — Encounter: Payer: Self-pay | Admitting: Family Medicine

## 2024-03-13 ENCOUNTER — Encounter: Payer: Self-pay | Admitting: Physical Therapy

## 2024-03-13 ENCOUNTER — Telehealth: Payer: Self-pay | Admitting: Family Medicine

## 2024-03-13 ENCOUNTER — Ambulatory Visit: Admitting: Physical Therapy

## 2024-03-13 VITALS — BP 168/98 | HR 86 | Temp 97.7°F | Resp 18 | Wt 239.8 lb

## 2024-03-13 DIAGNOSIS — K760 Fatty (change of) liver, not elsewhere classified: Secondary | ICD-10-CM | POA: Diagnosis not present

## 2024-03-13 DIAGNOSIS — M5412 Radiculopathy, cervical region: Secondary | ICD-10-CM | POA: Diagnosis not present

## 2024-03-13 DIAGNOSIS — K257 Chronic gastric ulcer without hemorrhage or perforation: Secondary | ICD-10-CM

## 2024-03-13 DIAGNOSIS — I700 Atherosclerosis of aorta: Secondary | ICD-10-CM

## 2024-03-13 DIAGNOSIS — I10 Essential (primary) hypertension: Secondary | ICD-10-CM | POA: Diagnosis not present

## 2024-03-13 DIAGNOSIS — Z1231 Encounter for screening mammogram for malignant neoplasm of breast: Secondary | ICD-10-CM

## 2024-03-13 DIAGNOSIS — E782 Mixed hyperlipidemia: Secondary | ICD-10-CM

## 2024-03-13 DIAGNOSIS — Z1159 Encounter for screening for other viral diseases: Secondary | ICD-10-CM

## 2024-03-13 DIAGNOSIS — D6861 Antiphospholipid syndrome: Secondary | ICD-10-CM

## 2024-03-13 DIAGNOSIS — E559 Vitamin D deficiency, unspecified: Secondary | ICD-10-CM

## 2024-03-13 DIAGNOSIS — Z789 Other specified health status: Secondary | ICD-10-CM

## 2024-03-13 DIAGNOSIS — Z6839 Body mass index (BMI) 39.0-39.9, adult: Secondary | ICD-10-CM

## 2024-03-13 DIAGNOSIS — M79602 Pain in left arm: Secondary | ICD-10-CM

## 2024-03-13 DIAGNOSIS — J302 Other seasonal allergic rhinitis: Secondary | ICD-10-CM

## 2024-03-13 DIAGNOSIS — Z8639 Personal history of other endocrine, nutritional and metabolic disease: Secondary | ICD-10-CM

## 2024-03-13 DIAGNOSIS — G629 Polyneuropathy, unspecified: Secondary | ICD-10-CM

## 2024-03-13 DIAGNOSIS — Z780 Asymptomatic menopausal state: Secondary | ICD-10-CM

## 2024-03-13 DIAGNOSIS — M4722 Other spondylosis with radiculopathy, cervical region: Secondary | ICD-10-CM

## 2024-03-13 DIAGNOSIS — I471 Supraventricular tachycardia, unspecified: Secondary | ICD-10-CM

## 2024-03-13 MED ORDER — OMEPRAZOLE 40 MG PO CPDR: 40.0000 mg | DELAYED_RELEASE_CAPSULE | Freq: Two times a day (BID) | ORAL | 3 refills | Status: DC

## 2024-03-13 MED ORDER — AMLODIPINE BESYLATE 5 MG PO TABS: 5.0000 mg | ORAL_TABLET | Freq: Every day | ORAL | 3 refills | Status: AC

## 2024-03-13 MED ORDER — CHOLECALCIFEROL 25 MCG (1000 UT) PO TABS: 1000.0000 [IU] | ORAL_TABLET | Freq: Every day | ORAL | 3 refills | Status: DC

## 2024-03-13 MED ORDER — EZETIMIBE 10 MG PO TABS: 10.0000 mg | ORAL_TABLET | Freq: Every day | ORAL | 3 refills | Status: AC

## 2024-03-13 MED ORDER — ASPIRIN 81 MG PO TBEC: 81.0000 mg | DELAYED_RELEASE_TABLET | Freq: Every day | ORAL | 3 refills | Status: AC

## 2024-03-13 MED ORDER — LISINOPRIL 40 MG PO TABS: 40.0000 mg | ORAL_TABLET | Freq: Every day | ORAL | 3 refills | Status: AC

## 2024-03-13 MED ORDER — METOPROLOL SUCCINATE ER 25 MG PO TB24: 12.5000 mg | ORAL_TABLET | Freq: Every day | ORAL | 3 refills | Status: AC

## 2024-03-13 MED ORDER — LEVOCETIRIZINE DIHYDROCHLORIDE 5 MG PO TABS: 5.0000 mg | ORAL_TABLET | Freq: Every evening | ORAL | 3 refills | Status: DC

## 2024-03-13 NOTE — Therapy (Signed)
 OUTPATIENT PHYSICAL THERAPY CERVICAL TREATMENT    Patient Name: Kirsten Choi MRN: [id number] DOB:January 03, 1956, 68 y.o., female Today's Date: 03/13/2024  END OF SESSION:  PT End of Session - 03/13/24 1257     Visit Number 14    Date for PT Re-Evaluation 04/02/24    PT Start Time 1300    PT Stop Time 1345    PT Time Calculation (min) 45 min    Activity Tolerance Patient tolerated treatment well    Behavior During Therapy WFL for tasks assessed/performed                 Past Medical History:  Diagnosis Date   Arthritis    Cancer (HCC)    breast CA (resolved)   Hypertension    Migraine    Past Surgical History:  Procedure Laterality Date   BREAST SURGERY     COSMETIC SURGERY     HERNIA REPAIR     REPLACEMENT TOTAL KNEE     WRIST SURGERY     Patient Active Problem List   Diagnosis Date Noted   Upper respiratory tract infection 02/19/2024   Class 2 severe obesity due to excess calories with serious comorbidity and body mass index (BMI) of 39.0 to 39.9 in adult (HCC) 01/10/2024   Vitamin D deficiency 01/10/2024   Thoracic aortic atherosclerosis (HCC) 01/10/2024   History of Helicobacter pylori infection 01/10/2024   Acute anterior lower chest wall pain 01/10/2024   Chronic gastric ulcer without hemorrhage and without perforation 01/10/2024   S/P TKR (total knee replacement), left 01/10/2024   History of bilateral breast cancer 01/10/2024   Metabolic dysfunction-associated steatotic liver disease (MASLD) 01/10/2024   Cervical radiculopathy due to degenerative joint disease of spine 01/10/2024   Antiphospholipid syndrome (HCC) 01/10/2024   OSA (obstructive sleep apnea) 06/15/2022   Dyspnea 06/15/2022   Paroxysmal SVT (supraventricular tachycardia) (HCC) 01/20/2020   Hypertension    Bradycardia     PCP: Kasandra Pain  REFERRING PROVIDER: Kasandra Pain  REFERRING DIAG:  [phone] (ICD-10-CM) - Cervical radiculopathy due to degenerative joint disease of spine     THERAPY DIAG:  Pain in left arm  Radiculopathy, cervical region  Rationale for Evaluation and Treatment: Rehabilitation  ONSET DATE: since Jan  SUBJECTIVE:                                                                                                                                                                                                         SUBJECTIVE STATEMENT: "Ok, a little tingly right here" Tips of 2nd, and 3rd digit  Hand dominance: Right  PERTINENT HISTORY:  Cervical radiculopathy with degenerative disease--Chronic left-sided cervical radiculopathy with significant discomfort radiating from the shoulder to the fingers, associated with numbness HTN, SVT, Antiphospholipid syndrome, MASLD, class 2 obesity, sleep apnea, hx of bilateral breast cancer, hx of L TKA  PAIN:  Are you having pain? Yes: NPRS scale: 0/10 Pain location: L shoulder blade and down my arm  Pain description: numbness/tingling, dull, throbbing  Aggravating factors: I don't know but it is worse in the morning Relieving factors: sometimes when I stretch my hands up it might be better  PRECAUTIONS: None  RED FLAGS: None     WEIGHT BEARING RESTRICTIONS: No  FALLS:  Has patient fallen in last 6 months? Yes. Number of falls 1, I stumble and trip because I am clumsy   LIVING ENVIRONMENT: Lives with: lives alone Lives in: House/apartment Stairs: Yes: External: 2 steps; avoid by going in through the garage  OCCUPATION: retired  PLOF: Independent  PATIENT GOALS: I want my numbness and pain gone and drive without numbness and pain  NEXT MD VISIT: I do but I don't remember when it is   OBJECTIVE:  Note: Objective measures were completed at Evaluation unless otherwise noted.  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   COGNITION: Overall cognitive status: Within functional  limits for tasks assessed  SENSATION: WFL  POSTURE: rounded shoulders, forward head, and increased thoracic kyphosis  PALPATION: Tightness in upper traps, some trigger points    CERVICAL ROM:   Active ROM A/PROM (deg) eval AROM 02/26/24  AROM 03/13/24  Flexion WFL WFL WFL  Extension WFL "just tight" [facility] WFL  Right lateral flexion 75% Limited 25% WFL  Left lateral flexion 75% WFL WFL  Right rotation [facility] WFL  Left rotation 75%  Limited 10% WFL   (Blank rows = not tested)  UPPER EXTREMITY ROM: WFL   UPPER EXTREMITY MMT: overall 4/5 except abduction on both sides is weak 3+    CERVICAL SPECIAL TESTS:  Upper limb tension test (ULTT): Positive, Spurling's test: Positive, and Distraction test: Positive   TREATMENT DATE:  03/13/24 NuStep L5 x 6 min GOALS Seated rows and lats 20# 2x12 Green band ER x10 Cervical retraction red 2x12 Shoulder ext  5# x10 Cervical traction 12lb x10 min  03/11/24 UBE L3 each way STM on UT and C spine, Theragun Green band ER x10 Red band pull apart x10  Seated rows and lats 20# 2x10 Shoulder ext  5# x10 Trigger Point Dry Needling  Subsequent Treatment: Instructions provided previously at initial dry needling treatment.   Patient Verbal Consent Given: Yes Education Handout Provided: Previously Provided Muscles Treated: upper traps Treatment Response/Outcome: LTR's DN performed and documented by Arminda Landmark, PT  03/06/24 UBE L3 x62mins each way Seated rows and lats 20# 2x10 Chest press 5# 2x10 Shoulder ext 5# 2x10 Pec stretch 15s x2 Chin tuck against ball on wall with 3s hold x10 Seated STM and passive stretching of neck Cervical traction 12lb x10 min   03/04/24 NuStep L 5 x7 min Rows & Lats 2lb 210 STM to UT and cervical spine Shoulder Ext green 2x10 Cervical retractions yellow 2x10 Cervical traction 12lb x10 min    PATIENT EDUCATION:  Education details: POC, HEP Person educated: Patient Education method:  Explanation Education comprehension: verbalized understanding  HOME EXERCISE PROGRAM: Access Code: [email or web address] URL: [email or web address] Date: 01/23/2024 Prepared by: Donavon Fudge  Exercises - Seated  Upper Trapezius Stretch  - 2 x daily - 7 x weekly - 2 reps - 15 hold - Seated Levator Scapulae Stretch  - 2 x daily - 7 x weekly - 2 reps - 15 hold - Median Nerve Flossing - Tray  - 2 x daily - 7 x weekly - 2 sets - 10 reps - Doorway Pec Stretch at 90 Degrees Abduction  - 2 x daily - 7 x weekly - 2 reps - 15 hold  ASSESSMENT:  CLINICAL IMPRESSION: Pt enters doing well overall, tingling reported in the digits of her L hand. No pain reported during session. She has progressed meeting some long term goals. Session focused on posterior chain strengthening of the shoulder girdle. Continued with traction due to subjective reports of it helping.She would benefit from further PT to address LUE tingling and UT tightness, so she can resume functional activities without limitations.   EVAL: Patient is a 68 y.o. female who was seen today for physical therapy evaluation and treatment for cervical radiculopathy with degenerative disease. Her symptoms started in January and has gotten progressively worse. She has discomfort radiating from the L shoulder blade to the first three fingers, associated with numbness and tingling. MRI revealed significant right-sided neuroforaminal stenosis at C5-C6 and multiple degenerative changes throughout the spine. She has been going to a chiro for 6 weeks now, 3x a week for adjustments and traction but has had no real changes. Patient presents with positive Spurling's and Distraction test. She also has some postural deficits and increased tightness in her upper traps. Patient will benefit from skilled PT to address her pain and numbness and tingling to be able to complete ADLs without discomfort.   OBJECTIVE IMPAIRMENTS: decreased ROM, decreased strength, postural  dysfunction, and pain.   PERSONAL FACTORS: Age, Behavior pattern, and Time since onset of injury/illness/exacerbation are also affecting patient's functional outcome.   REHAB POTENTIAL: Good  CLINICAL DECISION MAKING: Stable/uncomplicated  EVALUATION COMPLEXITY: Low  GOALS: Goals reviewed with patient? Yes  SHORT TERM GOALS: Target date: 02/27/24  Patient will be independent with initial HEP.  Baseline: given 01/23/24 Goal status: ongoing 01/29/24, Met 02/21/24  LONG TERM GOALS: Target date:04/02/24  Patient will be independent with advanced/ongoing HEP to improve outcomes and carryover.  Baseline:  Goal status: INITIAL  2.  Patient will report 75% improvement in L shoulder and arm pain to improve QOL.  Baseline: 5/10 Goal status: Progressed 30% 02/28/24, 3/10 progressing 03/13/24  3.  Patient will demonstrate full pain free cervical ROM an no pain with driving.  Baseline:  Goal status: Progressing 02/28/24, Met 03/13/24 no pain just tightness and a pull  4.  Patient will report decreased numbness and  tingling into her hand by 50% Baseline:  Goal status: Progressed 30% 02/28/24, Met 70% 03/13/24  5.  Patient will report decreased tightness in upper traps and cervical/neck and improved posture Baseline: tightness in UT and rounded shoulders Goal status: Ongoing 02/28/24   PLAN:  PT FREQUENCY: 2x/week  PT DURATION: 10 weeks  PLANNED INTERVENTIONS: 97110-Therapeutic exercises, 97530- Therapeutic activity, 97112- Neuromuscular re-education, 97535- Self Care, 16109- Manual therapy, G0283- Electrical stimulation (unattended), 97012- Traction (mechanical), Patient/Family education, Taping, Dry Needling, Joint mobilization, Spinal mobilization, Cryotherapy, and Moist heat  PLAN FOR NEXT SESSION: continue to work on postural mms, see how  the traction is  doing   Ollen Beverage, PTA 03/13/2024, 12:58 PM

## 2024-03-13 NOTE — Patient Instructions (Signed)
  VISIT SUMMARY: During your visit today, we reviewed your ongoing health conditions, including hypertension, severe obesity, hyperlipidemia, and other related issues. We discussed your current medications, symptoms, and necessary lifestyle modifications. We also planned several follow-up tests and screenings to monitor your health.  YOUR PLAN: -PRIMARY HYPERTENSION: Hypertension means high blood pressure. Your blood pressure was elevated today, so we will recheck it in the office. Please continue taking your current medications (losartan 40 mg daily, metoprolol  12.5 mg daily, and amlodipine  5 mg daily) and use an arm cuff for more accurate home readings. We will also check your kidney function, electrolytes, liver function, and urine for protein.  -SEVERE OBESITY WITH COMORBIDITIES: Severe obesity means having a very high body weight. It can lead to other health problems like high blood pressure and liver disease. We encourage you to make healthy lifestyle changes, including diet and exercise.  -HYPERLIPIDEMIA: Hyperlipidemia means having high levels of fats in your blood. Since you cannot take statins, you are managing it with Zetia  10 mg daily. We will check your lipid levels with a blood test.  -THORACIC AORTIC ATHEROSCLEROSIS: This condition involves the buildup of fats and cholesterol in the artery walls of your chest. It is managed with Zetia  due to your statin intolerance.  -METABOLIC DYSFUNCTION-ASSOCIATED STEATOTIC LIVER DISEASE: This liver disease is related to metabolic issues and fat buildup in the liver. We will check your liver function and blood sugar levels.  -PAROXYSMAL SUPRAVENTRICULAR TACHYCARDIA: This is a type of irregular heartbeat that is currently well controlled with metoprolol . We will check your thyroid function.  -ANTIPHOSPHOLIPID SYNDROME: This is an autoimmune disorder that increases the risk of blood clots. You are taking aspirin  81 mg daily to prevent clots. We will  recheck your blood counts and platelets.  -CHRONIC GASTRIC ULCER WITHOUT HEMORRHAGE OR PERFORATION: This means you have long-term stomach ulcers. You are taking omeprazole 40 mg twice daily to reduce stomach acid. We will check your blood counts and iron levels to ensure you are not anemic and to check for B12 deficiency.  -PERIPHERAL NEUROPATHY: This condition involves tingling in your feet and legs, which could be due to diabetes or B12 deficiency. We will check your B12 and folate levels, screen for diabetes, and perform a special blood test to rule out rare causes.  -CERVICAL RADICULOPATHY: This condition involves nerve pain in your neck that radiates to your arm. You are undergoing rehabilitation and chiropractic care, which you report is helping. Continue with your current treatment plan.  -VITAMIN D DEFICIENCY: This means you have low levels of vitamin D. You are taking 1000 mg daily, and we will check your vitamin D levels.  -GENERAL HEALTH MAINTENANCE: You are due for several health screenings, including hepatitis C, a  mammogram for breast cancer, and a DEXA scan for osteoporosis. We will order these tests.  INSTRUCTIONS: Please schedule a follow-up appointment in one month and a lab visit for fasting labs. We will also recheck your blood pressure in the office.                      Contains text generated by [employer]bridge.

## 2024-03-13 NOTE — Progress Notes (Signed)
 Assessment & Plan   Assessment/Plan:   Assessment and Plan Assessment & Plan Primary hypertension Blood pressure is elevated at 168/98 mmHg. Reports elevated readings during office visits but normal readings at home using a wrist cuff, which can be inaccurate due to positioning. No new symptoms reported. - Recheck blood pressure in office - Encourage use of an arm cuff for more accurate home readings - Continue losartan 40 mg daily, metoprolol  12.5 mg daily, amlodipine  5 mg daily - Check creatinine, electrolytes, liver function, and urinalysis with microalbumin/creatinine ratio  Severe obesity with comorbidities BMI of 39 with comorbidities including hypertension, hyperlipidemia, and metabolic dysfunction-associated steatotic liver disease. - Encourage healthy lifestyle modifications including diet and exercise  Hyperlipidemia Statin intolerant. Managed with Zetia  10 mg daily. - Check lipid panel  Thoracic aortic atherosclerosis Thoracic aortic atherosclerosis managed with Zetia  due to statin intolerance.  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 No new symptoms reported. - Check liver function with CMP and hemoglobin A1c  Paroxysmal supraventricular tachycardia Well controlled with metoprolol . No recent palpitations reported. - Check TSH  Antiphospholipid syndrome Managed with aspirin  81 mg daily for antiplatelet thrombotic prevention. - Recheck CBC and platelets  Chronic gastric ulcer without hemorrhage or perforation Chronic gastric ulcers, successfully treated H. pylori infection. On chronic PPIs for acid suppression. Omeprazole use may contribute to B12 deficiency. - Continue omeprazole 40 mg BID - Check CBC and iron levels to assess for anemia and B12 deficiency  Peripheral neuropathy Reports tingling in feet and legs. Possible causes include diabetes or B12 deficiency, especially given chronic  omeprazole use. - Check B12 and folate levels - Screen for diabetes with hemoglobin A1c - Perform SPEP to rule out myoglobinemia  Cervical radiculopathy Ongoing cervical radiculopathy with tingling in left arm. Undergoing rehabilitation and chiropractic care. Reports improvement with exercises but declines dry needling. - Continue current rehabilitation and chiropractic care  Vitamin D deficiency Vitamin D deficiency. Currently taking 1000 mg daily. - Check vitamin D levels  General Health Maintenance Due for hepatitis C screening, breast cancer screening with mammogram, and osteoporosis screening with DEXA scan. - Order hepatitis C screen - Order mammogram - Order DEXA scan  Follow-up Follow-up plans discussed for blood pressure monitoring and lab work. - Schedule follow-up appointment in one month - Schedule lab visit for fasting labs - Recheck blood pressure in office      Medications Discontinued During This Encounter  Medication Reason   benzonatate  (TESSALON ) 200 MG capsule    meloxicam (MOBIC) 15 MG tablet    methocarbamol  (ROBAXIN -750) 750 MG tablet    fluticasone  (FLONASE ) 50 MCG/ACT nasal spray    omeprazole (PRILOSEC) 40 MG capsule Reorder   amLODipine  (NORVASC ) 5 MG tablet Reorder   aspirin  EC 81 MG tablet Reorder   Cholecalciferol  25 MCG (1000 UT) tablet Reorder   ezetimibe  (ZETIA ) 10 MG tablet Reorder   lisinopril  (ZESTRIL ) 40 MG tablet Reorder   metoprolol  succinate (TOPROL -XL) 25 MG 24 hr tablet Reorder    Return in about 1 month (around 04/13/2024) for BP, fasting labs before next visit.        Subjective:   Encounter date: 03/13/2024  Kirsten Choi is a 68 y.o. female who has Paroxysmal SVT (supraventricular tachycardia) (HCC); Hypertension; Bradycardia; OSA (obstructive sleep apnea); Dyspnea; Class 2 severe obesity due to excess calories with serious comorbidity and body mass index (BMI) of 39.0 to 39.9 in adult [facility]); Vitamin D deficiency;  Thoracic aortic atherosclerosis (HCC); History of Helicobacter pylori  infection; Acute anterior lower chest wall pain; Chronic gastric ulcer without hemorrhage and without perforation; S/P TKR (total knee replacement), left; History of bilateral breast cancer; Metabolic dysfunction-associated steatotic liver disease (MASLD); Cervical radiculopathy due to degenerative joint disease of spine; Antiphospholipid syndrome (HCC); and Upper respiratory tract infection on their problem list..   She  has a past medical history of Arthritis, Cancer (HCC), Hypertension, and Migraine..   She presents with chief complaint of Annual Exam (Pt is not fasting /HM due- mammogram and dexa scan ) .   Discussed the use of AI scribe software for clinical note transcription with the patient, who gave verbal consent to proceed.  History of Present Illness Kirsten Choi is a 68 year old female with hypertension and severe obesity who presents for a chronic visit follow-up.  She has hypertension and is currently taking losartan 40 mg daily, metoprolol  12.5 mg daily, and amlodipine  5 mg daily. Her blood pressure tends to rise during doctor visits, but she monitors it at home using a wrist cuff, which she believes is accurate.  She has severe obesity with a BMI of 39 and associated comorbidities such as hypertension, hyperlipidemia, and metabolic dysfunction-associated steatotic liver disease. She is statin intolerant and manages her hyperlipidemia with Zetia  10 mg daily. She is concerned about diabetes and reports tingling in her legs and feet, which she associates with her cervical radiculopathy. No excessive thirst or urination, but she mentions dry skin.  Her past medical history includes antiphospholipid syndrome, for which she takes aspirin  81 mg daily, thoracic aortic atherosclerosis, vitamin D deficiency managed with 1000 mg daily supplementation, and sleep apnea. She also has a history of paroxysmal supraventricular  tachycardia, chronic gastric ulcers without hemorrhage or perforation, and a past H. pylori infection treated successfully. She is on omeprazole 40 mg BID for acid suppression.  She experiences shortness of breath at baseline for the past few years but denies any recent chest pain. She is undergoing rehabilitation for cervical radiculopathy, which causes tingling in her left arm, and she attends chiropractic sessions once a week. She is due for several health maintenance screenings, including a mammogram, DEXA scan, and hepatitis C screening.  In the review of symptoms, she denies any new chest pain, excessive thirst, or urination. She reports tingling in her legs and feet, ongoing for a couple of months, and dry skin. She also mentions itchy eyes and throat due to allergies, for which she uses saline but cannot tolerate Flonase .     ROS  Past Surgical History:  Procedure Laterality Date   BREAST SURGERY     COSMETIC SURGERY     HERNIA REPAIR     REPLACEMENT TOTAL KNEE     WRIST SURGERY      Outpatient Medications Prior to Visit  Medication Sig Dispense Refill   psyllium (METAMUCIL) 58.6 % packet Take 1 packet by mouth daily.     amLODipine  (NORVASC ) 5 MG tablet Take 1 tablet (5 mg total) by mouth daily. 90 tablet 3   aspirin  EC 81 MG tablet Take 1 tablet (81 mg total) by mouth daily. Swallow whole.     Cholecalciferol  25 MCG (1000 UT) tablet Take 1 tablet (1,000 Units total) by mouth daily. 90 tablet 3   ezetimibe  (ZETIA ) 10 MG tablet Take 1 tablet (10 mg total) by mouth daily. 90 tablet 3   fluticasone  (FLONASE ) 50 MCG/ACT nasal spray Place 2 sprays into both nostrils daily. 16 g 6   lisinopril  (ZESTRIL ) 40 MG tablet  Take 1 tablet (40 mg total) by mouth daily. 90 tablet 3   metoprolol  succinate (TOPROL -XL) 25 MG 24 hr tablet Take 0.5 tablets (12.5 mg total) by mouth daily. 45 tablet 3   omeprazole (PRILOSEC) 40 MG capsule Take 40 mg by mouth 2 (two) times daily.     benzonatate   (TESSALON ) 200 MG capsule Take 1 capsule (200 mg total) by mouth 3 (three) times daily as needed for cough. 20 capsule 0   meloxicam (MOBIC) 15 MG tablet PLEASE SEE ATTACHED FOR DETAILED DIRECTIONS (Patient not taking: Reported on 03/13/2024)     methocarbamol  (ROBAXIN -750) 750 MG tablet Take 1 tablet (750 mg total) by mouth every 6 (six) hours as needed for muscle spasms. 30 tablet 0   No facility-administered medications prior to visit.    Family History  Problem Relation Age of Onset   Leukemia Mother    Stroke Father    CAD Father    Cancer Father     Social History   Socioeconomic History   Marital status: Widowed    Spouse name: Not on file   Number of children: Not on file   Years of education: Not on file   Highest education level: Not on file  Occupational History   Not on file  Tobacco Use   Smoking status: Never   Smokeless tobacco: Never  Vaping Use   Vaping status: Never Used  Substance and Sexual Activity   Alcohol use: Never   Drug use: Never   Sexual activity: Not on file  Other Topics Concern   Not on file  Social History Narrative   Not on file   Social Drivers of Health   Financial Resource Strain: Unknown (08/15/2022)   Received from [facility]   Overall Financial Resource Strain (CARDIA)  Food Insecurity: Low Risk  (01/16/2024)   Received from [facility]   Hunger Vital Sign    Worried About Running Out of Food in the Last Year: Never true    Ran Out of Food in the Last Year: Never true  Transportation Needs: No Transportation Needs (01/16/2024)   Received from [facility]    In the past 12 months, has lack of reliable transportation kept you from medical appointments, meetings, work or from getting things needed for daily living? : No  Physical Activity: Sufficiently Active (08/15/2022)   Received from [facility]   Exercise Vital Sign  Stress: No Stress Concern Present (08/15/2022)   Received from [facility]    [employer] of Occupational Health - Occupational Stress Questionnaire  Social Connections: Unknown (08/15/2022)   Received from [facility] visits prior to 01/06/2023., [facility] visits prior to 01/06/2023.   Social Advertising account executive [NHANES]    Frequency of Communication with Friends and Family: More than three times a week    Frequency of Social Gatherings with Friends and Family: More than three times a week    Attends Religious Services: More than 4 times per year    Active Member of Clubs or Organizations: Yes    Attends Banker Meetings: More than 4 times per year    Marital Status: Patient refused  Intimate Partner Violence: Unknown (08/15/2022)   Received from [facility] visits prior to 01/06/2023., [facility] visits prior to 01/06/2023.   Humiliation, Afraid, Rape, and Kick questionnaire    Fear of Current or Ex-Partner: Patient refused  Emotionally Abused: Patient refused    Physically Abused: Patient refused    Sexually Abused: Patient refused                                                                                                  Objective:  Physical Exam: BP (!) 168/98 (BP Location: Left Arm, Patient Position: Sitting, Cuff Size: Large) Comment: recheck reading manual  Pulse 86   Temp 97.7 F (36.5 C) (Temporal)   Resp 18   Wt 239 lb 12.8 oz (108.8 kg)   SpO2 97%   BMI 39.90 kg/m    Physical Exam  GENERAL: Alert, cooperative, well developed, no acute distress. HEENT: Normocephalic, normal oropharynx, moist mucous membranes, throat without erythema, ears without effusion, oral cavity normal. CHEST: Clear to auscultation bilaterally, no wheezes, rhonchi, or crackles. CARDIOVASCULAR: Normal heart rate and rhythm, S1 and S2 normal without murmurs. ABDOMEN: Soft, non-tender, non-distended, without organomegaly, normal bowel  sounds. EXTREMITIES: No cyanosis or edema, no nail disease, lower extremities, sensation intact. NEUROLOGICAL: Cranial nerves grossly intact, moves all extremities without gross motor or sensory deficit.     No results found.  Recent Results (from the past 2160 hours)  POCT rapid strep A     Status: Normal   Collection Time: 02/15/24  1:18 PM  Result Value Ref Range   Rapid Strep A Screen Negative Negative  POC COVID-19 [employer]Now     Status: Normal   Collection Time: 02/15/24  1:23 PM  Result Value Ref Range   SARS Coronavirus 2 Ag Negative Negative  POCT Influenza A/B     Status: Normal   Collection Time: 02/15/24  1:23 PM  Result Value Ref Range   Influenza A, POC Negative Negative   Influenza B, POC Negative Negative        Carnell Christian, MD, MS

## 2024-03-13 NOTE — Telephone Encounter (Signed)
 error

## 2024-03-18 ENCOUNTER — Encounter: Payer: Self-pay | Admitting: Physical Therapy

## 2024-03-18 ENCOUNTER — Ambulatory Visit: Admitting: Physical Therapy

## 2024-03-18 DIAGNOSIS — M5412 Radiculopathy, cervical region: Secondary | ICD-10-CM

## 2024-03-18 DIAGNOSIS — M79602 Pain in left arm: Secondary | ICD-10-CM

## 2024-03-18 NOTE — Therapy (Signed)
 OUTPATIENT PHYSICAL THERAPY CERVICAL TREATMENT    Patient Name: Kirsten Choi MRN: [id number] DOB:1956-01-25, 68 y.o., female Today's Date: 03/18/2024  END OF SESSION:  PT End of Session - 03/18/24 0925     Visit Number 15    Date for PT Re-Evaluation 04/02/24    PT Start Time 0925    PT Stop Time 1010    PT Time Calculation (min) 45 min    Activity Tolerance Patient tolerated treatment well    Behavior During Therapy WFL for tasks assessed/performed                 Past Medical History:  Diagnosis Date   Arthritis    Cancer (HCC)    breast CA (resolved)   Hypertension    Migraine    Past Surgical History:  Procedure Laterality Date   BREAST SURGERY     COSMETIC SURGERY     HERNIA REPAIR     REPLACEMENT TOTAL KNEE     WRIST SURGERY     Patient Active Problem List   Diagnosis Date Noted   Upper respiratory tract infection 02/19/2024   Class 2 severe obesity due to excess calories with serious comorbidity and body mass index (BMI) of 39.0 to 39.9 in adult (HCC) 01/10/2024   Vitamin D deficiency 01/10/2024   Thoracic aortic atherosclerosis (HCC) 01/10/2024   History of Helicobacter pylori infection 01/10/2024   Acute anterior lower chest wall pain 01/10/2024   Chronic gastric ulcer without hemorrhage and without perforation 01/10/2024   S/P TKR (total knee replacement), left 01/10/2024   History of bilateral breast cancer 01/10/2024   Metabolic dysfunction-associated steatotic liver disease (MASLD) 01/10/2024   Cervical radiculopathy due to degenerative joint disease of spine 01/10/2024   Antiphospholipid syndrome (HCC) 01/10/2024   OSA (obstructive sleep apnea) 06/15/2022   Dyspnea 06/15/2022   Paroxysmal SVT (supraventricular tachycardia) (HCC) 01/20/2020   Hypertension    Bradycardia     PCP: Kasandra Pain  REFERRING PROVIDER: Kasandra Pain  REFERRING DIAG:  [phone] (ICD-10-CM) - Cervical radiculopathy due to degenerative joint disease of spine     THERAPY DIAG:  Pain in left arm  Radiculopathy, cervical region  Rationale for Evaluation and Treatment: Rehabilitation  ONSET DATE: since Jan  SUBJECTIVE:                                                                                                                                                                                                         SUBJECTIVE STATEMENT: "I got a little something caught up in there" pt points to  R UT area   Hand dominance: Right  PERTINENT HISTORY:  Cervical radiculopathy with degenerative disease--Chronic left-sided cervical radiculopathy with significant discomfort radiating from the shoulder to the fingers, associated with numbness HTN, SVT, Antiphospholipid syndrome, MASLD, class 2 obesity, sleep apnea, hx of bilateral breast cancer, hx of L TKA  PAIN:  Are you having pain? Yes: NPRS scale: 5/10 Pain location: R neck  Pain description: Burning Aggravating factors: Tilting her head  Relieving factors: sometimes when I stretch my hands up it might be better  PRECAUTIONS: None  RED FLAGS: None     WEIGHT BEARING RESTRICTIONS: No  FALLS:  Has patient fallen in last 6 months? Yes. Number of falls 1, I stumble and trip because I am clumsy   LIVING ENVIRONMENT: Lives with: lives alone Lives in: House/apartment Stairs: Yes: External: 2 steps; avoid by going in through the garage  OCCUPATION: retired  PLOF: Independent  PATIENT GOALS: I want my numbness and pain gone and drive without numbness and pain  NEXT MD VISIT: I do but I don't remember when it is   OBJECTIVE:  Note: Objective measures were completed at Evaluation unless otherwise noted.  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   COGNITION: Overall cognitive status: Within functional limits for tasks assessed  SENSATION: WFL  POSTURE: rounded shoulders,  forward head, and increased thoracic kyphosis  PALPATION: Tightness in upper traps, some trigger points    CERVICAL ROM:   Active ROM A/PROM (deg) eval AROM 02/26/24  AROM 03/13/24  Flexion WFL WFL WFL  Extension WFL "just tight" [facility] WFL  Right lateral flexion 75% Limited 25% WFL  Left lateral flexion 75% WFL WFL  Right rotation [facility] WFL  Left rotation 75%  Limited 10% WFL   (Blank rows = not tested)  UPPER EXTREMITY ROM: WFL   UPPER EXTREMITY MMT: overall 4/5 except abduction on both sides is weak 3+    CERVICAL SPECIAL TESTS:  Upper limb tension test (ULTT): Positive, Spurling's test: Positive, and Distraction test: Positive   TREATMENT DATE:  03/18/24 NuStep L5 x 6 min STM to UT, levators, cervical Para spinales  Cervical PROM with end rang holds  Shoulder Flex & and 1lb 2x10 Shoulder ER red 2x10 Cervical retractions red 2x10 Cervical traction 12lb x10 min  03/13/24 NuStep L5 x 6 min GOALS Seated rows and lats 20# 2x12 Green band ER x10 Cervical retraction red 2x12 Shoulder ext  5# x10 Cervical traction 12lb x10 min  03/11/24 UBE L3 each way STM on UT and C spine, Theragun Green band ER x10 Red band pull apart x10  Seated rows and lats 20# 2x10 Shoulder ext  5# x10 Trigger Point Dry Needling  Subsequent Treatment: Instructions provided previously at initial dry needling treatment.   Patient Verbal Consent Given: Yes Education Handout Provided: Previously Provided Muscles Treated: upper traps Treatment Response/Outcome: LTR's DN performed and documented by Arminda Landmark, PT  03/06/24 UBE L3 x33mins each way Seated rows and lats 20# 2x10 Chest press 5# 2x10 Shoulder ext 5# 2x10 Pec stretch 15s x2 Chin tuck against ball on wall with 3s hold x10 Seated STM and passive stretching of neck Cervical traction 12lb x10 min   03/04/24 NuStep L 5 x7 min Rows & Lats 2lb 210 STM to UT and cervical spine Shoulder Ext green 2x10 Cervical retractions yellow  2x10 Cervical traction 12lb x10 min    PATIENT EDUCATION:  Education  details: POC, HEP Person educated: Patient Education method: Explanation Education comprehension: verbalized understanding  HOME EXERCISE PROGRAM: Access Code: [email or web address] URL: [email or web address] Date: 01/23/2024 Prepared by: Donavon Fudge  Exercises - Seated Upper Trapezius Stretch  - 2 x daily - 7 x weekly - 2 reps - 15 hold - Seated Levator Scapulae Stretch  - 2 x daily - 7 x weekly - 2 reps - 15 hold - Median Nerve Flossing - Tray  - 2 x daily - 7 x weekly - 2 sets - 10 reps - Doorway Pec Stretch at 90 Degrees Abduction  - 2 x daily - 7 x weekly - 2 reps - 15 hold  ASSESSMENT:  CLINICAL IMPRESSION: Pt enters with reports of a catch on the R side oh her neck. Session focused on MT to UT and cervical spine. Continued with traction due to subjective reports of it helping. Postural cue needed with seated ER. Improved tissue elasticity post session. She would benefit from further PT to address LUE tingling and UT tightness, so she can resume functional activities without limitations.   EVAL: Patient is a 68 y.o. female who was seen today for physical therapy evaluation and treatment for cervical radiculopathy with degenerative disease. Her symptoms started in January and has gotten progressively worse. She has discomfort radiating from the L shoulder blade to the first three fingers, associated with numbness and tingling. MRI revealed significant right-sided neuroforaminal stenosis at C5-C6 and multiple degenerative changes throughout the spine. She has been going to a chiro for 6 weeks now, 3x a week for adjustments and traction but has had no real changes. Patient presents with positive Spurling's and Distraction test. She also has some postural deficits and increased tightness in her upper traps. Patient will benefit from skilled PT to address her pain and numbness and tingling to be able to complete ADLs  without discomfort.   OBJECTIVE IMPAIRMENTS: decreased ROM, decreased strength, postural dysfunction, and pain.   PERSONAL FACTORS: Age, Behavior pattern, and Time since onset of injury/illness/exacerbation are also affecting patient's functional outcome.   REHAB POTENTIAL: Good  CLINICAL DECISION MAKING: Stable/uncomplicated  EVALUATION COMPLEXITY: Low  GOALS: Goals reviewed with patient? Yes  SHORT TERM GOALS: Target date: 02/27/24  Patient will be independent with initial HEP.  Baseline: given 01/23/24 Goal status: ongoing 01/29/24, Met 02/21/24  LONG TERM GOALS: Target date:04/02/24  Patient will be independent with advanced/ongoing HEP to improve outcomes and carryover.  Baseline:  Goal status: INITIAL  2.  Patient will report 75% improvement in L shoulder and arm pain to improve QOL.  Baseline: 5/10 Goal status: Progressed 30% 02/28/24, 3/10 progressing 03/13/24  3.  Patient will demonstrate full pain free cervical ROM an no pain with driving.  Baseline:  Goal status: Progressing 02/28/24, Met 03/13/24 no pain just tightness and a pull  4.  Patient will report decreased numbness and  tingling into her hand by 50% Baseline:  Goal status: Progressed 30% 02/28/24, Met 70% 03/13/24  5.  Patient will report decreased tightness in upper traps and cervical/neck and improved posture Baseline: tightness in UT and rounded shoulders Goal status: Ongoing 02/28/24   PLAN:  PT FREQUENCY: 2x/week  PT DURATION: 10 weeks  PLANNED INTERVENTIONS: 97110-Therapeutic exercises, 97530- Therapeutic activity, 97112- Neuromuscular re-education, 97535- Self Care, 54098- Manual therapy, [id number]- Electrical stimulation (unattended), 97012- Traction (mechanical), Patient/Family education, Taping, Dry Needling, Joint mobilization, Spinal mobilization, Cryotherapy, and Moist heat  PLAN FOR NEXT SESSION: continue to work on postural mms, see how  the  traction is  doing   Ollen Beverage, PTA 03/18/2024,  9:26 AM

## 2024-03-20 ENCOUNTER — Encounter: Payer: Self-pay | Admitting: Physical Therapy

## 2024-03-20 ENCOUNTER — Ambulatory Visit: Admitting: Physical Therapy

## 2024-03-20 DIAGNOSIS — M5412 Radiculopathy, cervical region: Secondary | ICD-10-CM | POA: Diagnosis not present

## 2024-03-20 DIAGNOSIS — M79602 Pain in left arm: Secondary | ICD-10-CM

## 2024-03-20 NOTE — Therapy (Signed)
 OUTPATIENT PHYSICAL THERAPY CERVICAL TREATMENT    Patient Name: Samariah Simones MRN: [id number] DOB:July 01, 1956, 68 y.o., female Today's Date: 03/20/2024  END OF SESSION:  PT End of Session - 03/20/24 1140     Visit Number 16    Date for PT Re-Evaluation 04/02/24    PT Start Time 1140    PT Stop Time 1230    PT Time Calculation (min) 50 min    Activity Tolerance Patient tolerated treatment well    Behavior During Therapy WFL for tasks assessed/performed                 Past Medical History:  Diagnosis Date   Arthritis    Cancer (HCC)    breast CA (resolved)   Hypertension    Migraine    Past Surgical History:  Procedure Laterality Date   BREAST SURGERY     COSMETIC SURGERY     HERNIA REPAIR     REPLACEMENT TOTAL KNEE     WRIST SURGERY     Patient Active Problem List   Diagnosis Date Noted   Upper respiratory tract infection 02/19/2024   Class 2 severe obesity due to excess calories with serious comorbidity and body mass index (BMI) of 39.0 to 39.9 in adult (HCC) 01/10/2024   Vitamin D deficiency 01/10/2024   Thoracic aortic atherosclerosis (HCC) 01/10/2024   History of Helicobacter pylori infection 01/10/2024   Acute anterior lower chest wall pain 01/10/2024   Chronic gastric ulcer without hemorrhage and without perforation 01/10/2024   S/P TKR (total knee replacement), left 01/10/2024   History of bilateral breast cancer 01/10/2024   Metabolic dysfunction-associated steatotic liver disease (MASLD) 01/10/2024   Cervical radiculopathy due to degenerative joint disease of spine 01/10/2024   Antiphospholipid syndrome (HCC) 01/10/2024   OSA (obstructive sleep apnea) 06/15/2022   Dyspnea 06/15/2022   Paroxysmal SVT (supraventricular tachycardia) (HCC) 01/20/2020   Hypertension    Bradycardia     PCP: Kasandra Pain  REFERRING PROVIDER: Kasandra Pain  REFERRING DIAG:  [phone] (ICD-10-CM) - Cervical radiculopathy due to degenerative joint disease of spine     THERAPY DIAG:  Pain in left arm  Radiculopathy, cervical region  Rationale for Evaluation and Treatment: Rehabilitation  ONSET DATE: since Jan  SUBJECTIVE:                                                                                                                                                                                                         SUBJECTIVE STATEMENT: "OK" had acupuncture yesterday  Hand dominance: Right  PERTINENT HISTORY:  Cervical radiculopathy with degenerative disease--Chronic left-sided cervical radiculopathy with significant discomfort radiating from the shoulder to the fingers, associated with numbness HTN, SVT, Antiphospholipid syndrome, MASLD, class 2 obesity, sleep apnea, hx of bilateral breast cancer, hx of L TKA  PAIN:  Are you having pain? Yes: NPRS scale: 5/10 Pain location: R neck  Pain description: Burning Aggravating factors: Tilting her head  Relieving factors: sometimes when I stretch my hands up it might be better  PRECAUTIONS: None  RED FLAGS: None     WEIGHT BEARING RESTRICTIONS: No  FALLS:  Has patient fallen in last 6 months? Yes. Number of falls 1, I stumble and trip because I am clumsy   LIVING ENVIRONMENT: Lives with: lives alone Lives in: House/apartment Stairs: Yes: External: 2 steps; avoid by going in through the garage  OCCUPATION: retired  PLOF: Independent  PATIENT GOALS: I want my numbness and pain gone and drive without numbness and pain  NEXT MD VISIT: I do but I don't remember when it is   OBJECTIVE:  Note: Objective measures were completed at Evaluation unless otherwise noted.  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   COGNITION: Overall cognitive status: Within functional limits for tasks assessed  SENSATION: WFL  POSTURE: rounded shoulders, forward head, and increased thoracic  kyphosis  PALPATION: Tightness in upper traps, some trigger points    CERVICAL ROM:   Active ROM A/PROM (deg) eval AROM 02/26/24  AROM 03/13/24  Flexion WFL WFL WFL  Extension WFL "just tight" [facility] WFL  Right lateral flexion 75% Limited 25% WFL  Left lateral flexion 75% WFL WFL  Right rotation [facility] WFL  Left rotation 75%  Limited 10% WFL   (Blank rows = not tested)  UPPER EXTREMITY ROM: WFL   UPPER EXTREMITY MMT: overall 4/5 except abduction on both sides is weak 3+    CERVICAL SPECIAL TESTS:  Upper limb tension test (ULTT): Positive, Spurling's test: Positive, and Distraction test: Positive   TREATMENT DATE:  03/20/24 NuStep L5 x 7 min Seated rows and lats 20# 2x15 Shoulder Ext 5lb 2x10 Horizontal abd red 2x10 UT levator self stretch  ER Red 2x10 Cervical retraction yellow 2x10 STM to UT, levators, cervical Para spinales  Cervical PROM with end rang holds  Cervical traction 12lb x10 min  03/18/24 NuStep L5 x 6 min STM to UT, levators, cervical Para spinales  Cervical PROM with end rang holds  Shoulder Flex & and 1lb 2x10 Shoulder ER red 2x10 Cervical retractions red 2x10 Cervical traction 12lb x10 min  03/13/24 NuStep L5 x 6 min GOALS Seated rows and lats 20# 2x12 Green band ER x10 Cervical retraction red 2x12 Shoulder ext  5# x10 Cervical traction 12lb x10 min  03/11/24 UBE L3 each way STM on UT and C spine, Theragun Green band ER x10 Red band pull apart x10  Seated rows and lats 20# 2x10 Shoulder ext  5# x10 Trigger Point Dry Needling  Subsequent Treatment: Instructions provided previously at initial dry needling treatment.   Patient Verbal Consent Given: Yes Education Handout Provided: Previously Provided Muscles Treated: upper traps Treatment Response/Outcome: LTR's DN performed and documented by Arminda Landmark, PT  03/06/24 UBE L3 x30mins each way Seated rows and lats 20# 2x10 Chest press 5# 2x10 Shoulder ext 5# 2x10 Pec stretch 15s  x2 Chin tuck against ball on wall with 3s hold x10 Seated STM and passive stretching of neck Cervical traction 12lb  x10 min   03/04/24 NuStep L 5 x7 min Rows & Lats 2lb 210 STM to UT and cervical spine Shoulder Ext green 2x10 Cervical retractions yellow 2x10 Cervical traction 12lb x10 min    PATIENT EDUCATION:  Education details: POC, HEP Person educated: Patient Education method: Explanation Education comprehension: verbalized understanding  HOME EXERCISE PROGRAM: Access Code: [email or web address] URL: [email or web address] Date: 01/23/2024 Prepared by: Donavon Fudge  Exercises - Seated Upper Trapezius Stretch  - 2 x daily - 7 x weekly - 2 reps - 15 hold - Seated Levator Scapulae Stretch  - 2 x daily - 7 x weekly - 2 reps - 15 hold - Median Nerve Flossing - Tray  - 2 x daily - 7 x weekly - 2 sets - 10 reps - Doorway Pec Stretch at 90 Degrees Abduction  - 2 x daily - 7 x weekly - 2 reps - 15 hold  ASSESSMENT:  CLINICAL IMPRESSION: Pt enters doing well. Session focused on MT to UT and cervical spine. Continued with traction due to subjective reports of it helping. Postural cue needed with seated ER and horizontal abduction. She has progressed towards LTG's.  Improved tissue elasticity post session. She would benefit from further PT to address LUE tingling and UT tightness, so she can resume functional activities without limitations.   EVAL: Patient is a 68 y.o. female who was seen today for physical therapy evaluation and treatment for cervical radiculopathy with degenerative disease. Her symptoms started in January and has gotten progressively worse. She has discomfort radiating from the L shoulder blade to the first three fingers, associated with numbness and tingling. MRI revealed significant right-sided neuroforaminal stenosis at C5-C6 and multiple degenerative changes throughout the spine. She has been going to a chiro for 6 weeks now, 3x a week for adjustments and traction  but has had no real changes. Patient presents with positive Spurling's and Distraction test. She also has some postural deficits and increased tightness in her upper traps. Patient will benefit from skilled PT to address her pain and numbness and tingling to be able to complete ADLs without discomfort.   OBJECTIVE IMPAIRMENTS: decreased ROM, decreased strength, postural dysfunction, and pain.   PERSONAL FACTORS: Age, Behavior pattern, and Time since onset of injury/illness/exacerbation are also affecting patient's functional outcome.   REHAB POTENTIAL: Good  CLINICAL DECISION MAKING: Stable/uncomplicated  EVALUATION COMPLEXITY: Low  GOALS: Goals reviewed with patient? Yes  SHORT TERM GOALS: Target date: 02/27/24  Patient will be independent with initial HEP.  Baseline: given 01/23/24 Goal status: ongoing 01/29/24, Met 02/21/24  LONG TERM GOALS: Target date:04/02/24  Patient will be independent with advanced/ongoing HEP to improve outcomes and carryover.  Baseline:  Goal status: INITIAL  2.  Patient will report 75% improvement in L shoulder and arm pain to improve QOL.  Baseline: 5/10 Goal status: Progressed 30% 02/28/24, 3/10 progressing 03/13/24, 03/20/24 60% progressing   3.  Patient will demonstrate full pain free cervical ROM an no pain with driving.  Baseline:  Goal status: Progressing 02/28/24, Met 03/13/24 no pain just tightness and a pull  4.  Patient will report decreased numbness and  tingling into her hand by 50% Baseline:  Goal status: Progressed 30% 02/28/24, Met 70% 03/13/24  5.  Patient will report decreased tightness in upper traps and cervical/neck and improved posture Baseline: tightness in UT and rounded shoulders Goal status: Ongoing 02/28/24, Progressed 50% 03/20/24  PLAN:  PT FREQUENCY: 2x/week  PT DURATION: 10 weeks  PLANNED INTERVENTIONS: 97110-Therapeutic exercises, 97530-  Therapeutic activity, [id number]- Neuromuscular re-education, [phone]- Self Care, [id number]- Manual  therapy, G0283- Electrical stimulation (unattended), [phone]- Traction (mechanical), Patient/Family education, Taping, Dry Needling, Joint mobilization, Spinal mobilization, Cryotherapy, and Moist heat  PLAN FOR NEXT SESSION: continue to work on postural mms, see how  the traction is  doing   Ollen Beverage, PTA 03/20/2024, 11:41 AM

## 2024-03-25 ENCOUNTER — Ambulatory Visit: Admitting: Physical Therapy

## 2024-03-25 ENCOUNTER — Ambulatory Visit: Admitting: Family Medicine

## 2024-03-27 ENCOUNTER — Ambulatory Visit: Admitting: Physical Therapy

## 2024-03-27 ENCOUNTER — Encounter: Payer: Self-pay | Admitting: Physical Therapy

## 2024-03-27 DIAGNOSIS — M5412 Radiculopathy, cervical region: Secondary | ICD-10-CM

## 2024-03-27 DIAGNOSIS — M79602 Pain in left arm: Secondary | ICD-10-CM

## 2024-03-27 NOTE — Therapy (Signed)
 OUTPATIENT PHYSICAL THERAPY CERVICAL TREATMENT    Patient Name: Kirsten Choi MRN: [id number] DOB:Oct 14, 1956, 68 y.o., female Today's Date: 03/27/2024  END OF SESSION:  PT End of Session - 03/27/24 0927     Visit Number 17    Date for PT Re-Evaluation 04/02/24    PT Start Time 0928    PT Stop Time 1013    PT Time Calculation (min) 45 min    Activity Tolerance Patient tolerated treatment well    Behavior During Therapy WFL for tasks assessed/performed                 Past Medical History:  Diagnosis Date   Arthritis    Cancer (HCC)    breast CA (resolved)   Hypertension    Migraine    Past Surgical History:  Procedure Laterality Date   BREAST SURGERY     COSMETIC SURGERY     HERNIA REPAIR     REPLACEMENT TOTAL KNEE     WRIST SURGERY     Patient Active Problem List   Diagnosis Date Noted   Upper respiratory tract infection 02/19/2024   Class 2 severe obesity due to excess calories with serious comorbidity and body mass index (BMI) of 39.0 to 39.9 in adult (HCC) 01/10/2024   Vitamin D deficiency 01/10/2024   Thoracic aortic atherosclerosis (HCC) 01/10/2024   History of Helicobacter pylori infection 01/10/2024   Acute anterior lower chest wall pain 01/10/2024   Chronic gastric ulcer without hemorrhage and without perforation 01/10/2024   S/P TKR (total knee replacement), left 01/10/2024   History of bilateral breast cancer 01/10/2024   Metabolic dysfunction-associated steatotic liver disease (MASLD) 01/10/2024   Cervical radiculopathy due to degenerative joint disease of spine 01/10/2024   Antiphospholipid syndrome (HCC) 01/10/2024   OSA (obstructive sleep apnea) 06/15/2022   Dyspnea 06/15/2022   Paroxysmal SVT (supraventricular tachycardia) (HCC) 01/20/2020   Hypertension    Bradycardia     PCP: Kasandra Pain  REFERRING PROVIDER: Kasandra Pain  REFERRING DIAG:  [phone] (ICD-10-CM) - Cervical radiculopathy due to degenerative joint disease of spine     THERAPY DIAG:  Pain in left arm  Radiculopathy, cervical region  Rationale for Evaluation and Treatment: Rehabilitation  ONSET DATE: since Jan  SUBJECTIVE:                                                                                                                                                                                                         SUBJECTIVE STATEMENT: "OK" Neck is not too bad, just a little tingling  Hand  dominance: Right  PERTINENT HISTORY:  Cervical radiculopathy with degenerative disease--Chronic left-sided cervical radiculopathy with significant discomfort radiating from the shoulder to the fingers, associated with numbness HTN, SVT, Antiphospholipid syndrome, MASLD, class 2 obesity, sleep apnea, hx of bilateral breast cancer, hx of L TKA  PAIN:  Are you having pain? Yes: NPRS scale: 2/10 Pain location: R neck  Pain description: Burning Aggravating factors: Tilting her head  Relieving factors: sometimes when I stretch my hands up it might be better  PRECAUTIONS: None  RED FLAGS: None     WEIGHT BEARING RESTRICTIONS: No  FALLS:  Has patient fallen in last 6 months? Yes. Number of falls 1, I stumble and trip because I am clumsy   LIVING ENVIRONMENT: Lives with: lives alone Lives in: House/apartment Stairs: Yes: External: 2 steps; avoid by going in through the garage  OCCUPATION: retired  PLOF: Independent  PATIENT GOALS: I want my numbness and pain gone and drive without numbness and pain  NEXT MD VISIT: I do but I don't remember when it is   OBJECTIVE:  Note: Objective measures were completed at Evaluation unless otherwise noted.  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   COGNITION: Overall cognitive status: Within functional limits for tasks assessed  SENSATION: WFL  POSTURE: rounded shoulders, forward head, and  increased thoracic kyphosis  PALPATION: Tightness in upper traps, some trigger points    CERVICAL ROM:   Active ROM A/PROM (deg) eval AROM 02/26/24  AROM 03/13/24  Flexion WFL WFL WFL  Extension WFL "just tight" [facility] WFL  Right lateral flexion 75% Limited 25% WFL  Left lateral flexion 75% WFL WFL  Right rotation [facility] WFL  Left rotation 75%  Limited 10% WFL   (Blank rows = not tested)  UPPER EXTREMITY ROM: WFL   UPPER EXTREMITY MMT: overall 4/5 except abduction on both sides is weak 3+    CERVICAL SPECIAL TESTS:  Upper limb tension test (ULTT): Positive, Spurling's test: Positive, and Distraction test: Positive   TREATMENT DATE:  03/27/24 NuStep L5 x 7 min Seated rows and lats 20# 2x10 Shoulder Ext 5lb 2x10 ER Red 2x10 Horiz abd red 2x10 Cervical retraction yellow 2x10 Shoulder flex & Abd 2lb x 10  STM to UT, levators, cervical Para spinales  Cervical PROM with end rang holds  Cervical traction 12lb x10 min  03/20/24 NuStep L5 x 7 min Seated rows and lats 20# 2x15 Shoulder Ext 5lb 2x10 Horizontal abd red 2x10 UT levator self stretch  ER Red 2x10 Cervical retraction yellow 2x10 STM to UT, levators, cervical Para spinales  Cervical PROM with end rang holds  Cervical traction 12lb x10 min  03/18/24 NuStep L5 x 6 min STM to UT, levators, cervical Para spinales  Cervical PROM with end rang holds  Shoulder Flex & and 1lb 2x10 Shoulder ER red 2x10 Cervical retractions red 2x10 Cervical traction 12lb x10 min  03/13/24 NuStep L5 x 6 min GOALS Seated rows and lats 20# 2x12 Green band ER x10 Cervical retraction red 2x12 Shoulder ext  5# x10 Cervical traction 12lb x10 min  03/11/24 UBE L3 each way STM on UT and C spine, Theragun Green band ER x10 Red band pull apart x10  Seated rows and lats 20# 2x10 Shoulder ext  5# x10 Trigger Point Dry Needling  Subsequent Treatment: Instructions provided previously at initial dry needling treatment.   Patient  Verbal Consent Given: Yes Education Handout Provided:  Previously Provided Muscles Treated: upper traps Treatment Response/Outcome: LTR's DN performed and documented by Arminda Landmark, PT   PATIENT EDUCATION:  Education details: POC, HEP Person educated: Patient Education method: Explanation Education comprehension: verbalized understanding  HOME EXERCISE PROGRAM: Access Code: [email or web address] URL: [email or web address] Date: 01/23/2024 Prepared by: Donavon Fudge  Exercises - Seated Upper Trapezius Stretch  - 2 x daily - 7 x weekly - 2 reps - 15 hold - Seated Levator Scapulae Stretch  - 2 x daily - 7 x weekly - 2 reps - 15 hold - Median Nerve Flossing - Tray  - 2 x daily - 7 x weekly - 2 sets - 10 reps - Doorway Pec Stretch at 90 Degrees Abduction  - 2 x daily - 7 x weekly - 2 reps - 15 hold  ASSESSMENT:  CLINICAL IMPRESSION: Pt enters doing well with less pain overall. Session focused on MT to UT and cervical spine. Continued with traction due to subjective reports of it helping. Postural cue needed with seated ER and horizontal abduction. She has progressed towards LTG's. No repots of pain during session. Improved tissue elasticity post session. She would benefit from further PT to address LUE tingling and UT tightness, so she can resume functional activities without limitations.   EVAL: Patient is a 68 y.o. female who was seen today for physical therapy evaluation and treatment for cervical radiculopathy with degenerative disease. Her symptoms started in January and has gotten progressively worse. She has discomfort radiating from the L shoulder blade to the first three fingers, associated with numbness and tingling. MRI revealed significant right-sided neuroforaminal stenosis at C5-C6 and multiple degenerative changes throughout the spine. She has been going to a chiro for 6 weeks now, 3x a week for adjustments and traction but has had no real changes. Patient presents with positive  Spurling's and Distraction test. She also has some postural deficits and increased tightness in her upper traps. Patient will benefit from skilled PT to address her pain and numbness and tingling to be able to complete ADLs without discomfort.   OBJECTIVE IMPAIRMENTS: decreased ROM, decreased strength, postural dysfunction, and pain.   PERSONAL FACTORS: Age, Behavior pattern, and Time since onset of injury/illness/exacerbation are also affecting patient's functional outcome.   REHAB POTENTIAL: Good  CLINICAL DECISION MAKING: Stable/uncomplicated  EVALUATION COMPLEXITY: Low  GOALS: Goals reviewed with patient? Yes  SHORT TERM GOALS: Target date: 02/27/24  Patient will be independent with initial HEP.  Baseline: given 01/23/24 Goal status: ongoing 01/29/24, Met 02/21/24  LONG TERM GOALS: Target date:04/02/24  Patient will be independent with advanced/ongoing HEP to improve outcomes and carryover.  Baseline:  Goal status: INITIAL  2.  Patient will report 75% improvement in L shoulder and arm pain to improve QOL.  Baseline: 5/10 Goal status: Progressed 30% 02/28/24, 3/10 progressing 03/13/24, 03/20/24 60% progressing   3.  Patient will demonstrate full pain free cervical ROM an no pain with driving.  Baseline:  Goal status: Progressing 02/28/24, Met 03/13/24 no pain just tightness and a pull  4.  Patient will report decreased numbness and  tingling into her hand by 50% Baseline:  Goal status: Progressed 30% 02/28/24, Met 70% 03/13/24  5.  Patient will report decreased tightness in upper traps and cervical/neck and improved posture Baseline: tightness in UT and rounded shoulders Goal status: Ongoing 02/28/24, Progressed 50% 03/20/24, 60& progressing 03/27/24  PLAN:  PT FREQUENCY: 2x/week  PT DURATION: 10 weeks  PLANNED INTERVENTIONS: 97110-Therapeutic exercises, 97530- Therapeutic activity, 97112- Neuromuscular re-education, 97535-  Self Care, 29562- Manual therapy, G0283- Electrical  stimulation (unattended), [phone]- Traction (mechanical), Patient/Family education, Taping, Dry Needling, Joint mobilization, Spinal mobilization, Cryotherapy, and Moist heat  PLAN FOR NEXT SESSION: continue to work on postural mms, see how  the traction is  doing   Ollen Beverage, PTA 03/27/2024, 9:28 AM

## 2024-03-28 NOTE — Therapy (Signed)
 OUTPATIENT PHYSICAL THERAPY CERVICAL TREATMENT    Patient Name: Kirsten Choi MRN: [id number] DOB:07/20/1956, 68 y.o., female Today's Date: 04/01/2024  END OF SESSION:  PT End of Session - 04/01/24 0929     Visit Number 18    Date for PT Re-Evaluation 04/02/24    PT Start Time 0930    PT Stop Time 1015    PT Time Calculation (min) 45 min    Activity Tolerance Patient tolerated treatment well    Behavior During Therapy WFL for tasks assessed/performed                  Past Medical History:  Diagnosis Date   Arthritis    Cancer (HCC)    breast CA (resolved)   Hypertension    Migraine    Past Surgical History:  Procedure Laterality Date   BREAST SURGERY     COSMETIC SURGERY     HERNIA REPAIR     REPLACEMENT TOTAL KNEE     WRIST SURGERY     Patient Active Problem List   Diagnosis Date Noted   Upper respiratory tract infection 02/19/2024   Class 2 severe obesity due to excess calories with serious comorbidity and body mass index (BMI) of 39.0 to 39.9 in adult (HCC) 01/10/2024   Vitamin D deficiency 01/10/2024   Thoracic aortic atherosclerosis (HCC) 01/10/2024   History of Helicobacter pylori infection 01/10/2024   Acute anterior lower chest wall pain 01/10/2024   Chronic gastric ulcer without hemorrhage and without perforation 01/10/2024   S/P TKR (total knee replacement), left 01/10/2024   History of bilateral breast cancer 01/10/2024   Metabolic dysfunction-associated steatotic liver disease (MASLD) 01/10/2024   Cervical radiculopathy due to degenerative joint disease of spine 01/10/2024   Antiphospholipid syndrome (HCC) 01/10/2024   OSA (obstructive sleep apnea) 06/15/2022   Dyspnea 06/15/2022   Paroxysmal SVT (supraventricular tachycardia) (HCC) 01/20/2020   Hypertension    Bradycardia     PCP: Kasandra Pain  REFERRING PROVIDER: Kasandra Pain  REFERRING DIAG:  [phone] (ICD-10-CM) - Cervical radiculopathy due to degenerative joint disease of spine     THERAPY DIAG:  Radiculopathy, cervical region  Rationale for Evaluation and Treatment: Rehabilitation  ONSET DATE: since Jan  SUBJECTIVE:                                                                                                                                                                                                         SUBJECTIVE STATEMENT: "OK" it is getting better. I do those nerve glide things when it starts to hurt  and it goes away.   Hand dominance: Right  PERTINENT HISTORY:  Cervical radiculopathy with degenerative disease--Chronic left-sided cervical radiculopathy with significant discomfort radiating from the shoulder to the fingers, associated with numbness HTN, SVT, Antiphospholipid syndrome, MASLD, class 2 obesity, sleep apnea, hx of bilateral breast cancer, hx of L TKA  PAIN:  Are you having pain? Yes: NPRS scale: 2/10 Pain location: R neck  Pain description: Burning Aggravating factors: Tilting her head  Relieving factors: sometimes when I stretch my hands up it might be better  PRECAUTIONS: None  RED FLAGS: None     WEIGHT BEARING RESTRICTIONS: No  FALLS:  Has patient fallen in last 6 months? Yes. Number of falls 1, I stumble and trip because I am clumsy   LIVING ENVIRONMENT: Lives with: lives alone Lives in: House/apartment Stairs: Yes: External: 2 steps; avoid by going in through the garage  OCCUPATION: retired  PLOF: Independent  PATIENT GOALS: I want my numbness and pain gone and drive without numbness and pain  NEXT MD VISIT: I do but I don't remember when it is   OBJECTIVE:  Note: Objective measures were completed at Evaluation unless otherwise noted.  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   COGNITION: Overall cognitive status: Within functional limits for tasks assessed  SENSATION: WFL  POSTURE: rounded  shoulders, forward head, and increased thoracic kyphosis  PALPATION: Tightness in upper traps, some trigger points    CERVICAL ROM:   Active ROM A/PROM (deg) eval AROM 02/26/24  AROM 03/13/24  Flexion WFL WFL WFL  Extension WFL "just tight" [facility] WFL  Right lateral flexion 75% Limited 25% WFL  Left lateral flexion 75% WFL WFL  Right rotation [facility] WFL  Left rotation 75%  Limited 10% WFL   (Blank rows = not tested)  UPPER EXTREMITY ROM: WFL   UPPER EXTREMITY MMT: overall 4/5 except abduction on both sides is weak 3+    CERVICAL SPECIAL TESTS:  Upper limb tension test (ULTT): Positive, Spurling's test: Positive, and Distraction test: Positive   TREATMENT DATE:  04/01/24 Recheck goals UBE L2 x19mins [address]  Shoulder ext 5# 2x10 Seated row and lats 20# 2x10 Scapular retraction red band 2x10 Cervical retractions 2x10  Cervical traction 12lb x10 min  03/27/24 NuStep L5 x 7 min Seated rows and lats 20# 2x10 Shoulder Ext 5lb 2x10 ER Red 2x10 Horiz abd red 2x10 Cervical retraction yellow 2x10 Shoulder flex & Abd 2lb x 10  STM to UT, levators, cervical Para spinales  Cervical PROM with end rang holds  Cervical traction 12lb x10 min  03/20/24 NuStep L5 x 7 min Seated rows and lats 20# 2x15 Shoulder Ext 5lb 2x10 Horizontal abd red 2x10 UT levator self stretch  ER Red 2x10 Cervical retraction yellow 2x10 STM to UT, levators, cervical Para spinales  Cervical PROM with end rang holds  Cervical traction 12lb x10 min  03/18/24 NuStep L5 x 6 min STM to UT, levators, cervical Para spinales  Cervical PROM with end rang holds  Shoulder Flex & and 1lb 2x10 Shoulder ER red 2x10 Cervical retractions red 2x10 Cervical traction 12lb x10 min  03/13/24 NuStep L5 x 6 min GOALS Seated rows and lats 20# 2x12 Green band ER x10 Cervical retraction red 2x12 Shoulder ext  5# x10 Cervical traction 12lb x10 min  03/11/24 UBE L3 each way STM on UT and C spine, Theragun Green  band ER x10 Red  band pull apart x10  Seated rows and lats 20# 2x10 Shoulder ext  5# x10 Trigger Point Dry Needling  Subsequent Treatment: Instructions provided previously at initial dry needling treatment.   Patient Verbal Consent Given: Yes Education Handout Provided: Previously Provided Muscles Treated: upper traps Treatment Response/Outcome: LTR's DN performed and documented by Arminda Landmark, PT   PATIENT EDUCATION:  Education details: POC, HEP Person educated: Patient Education method: Explanation Education comprehension: verbalized understanding  HOME EXERCISE PROGRAM: Access Code: [email or web address] URL: [email or web address] Date: 01/23/2024 Prepared by: Donavon Fudge  Exercises - Seated Upper Trapezius Stretch  - 2 x daily - 7 x weekly - 2 reps - 15 hold - Seated Levator Scapulae Stretch  - 2 x daily - 7 x weekly - 2 reps - 15 hold - Median Nerve Flossing - Tray  - 2 x daily - 7 x weekly - 2 sets - 10 reps - Doorway Pec Stretch at 90 Degrees Abduction  - 2 x daily - 7 x weekly - 2 reps - 15 hold  ASSESSMENT:  CLINICAL IMPRESSION: Pt enters doing well with less pain overall. Continued with traction due to subjective reports of it helping. She feels that she is able to continue her exercises on her own. She is also seeing a chiropractor regularly and has started acupuncture. I also showed her self manual traction devices she can order online to use at home.   EVAL: Patient is a 68 y.o. female who was seen today for physical therapy evaluation and treatment for cervical radiculopathy with degenerative disease. Her symptoms started in January and has gotten progressively worse. She has discomfort radiating from the L shoulder blade to the first three fingers, associated with numbness and tingling. MRI revealed significant right-sided neuroforaminal stenosis at C5-C6 and multiple degenerative changes throughout the spine. She has been going to a chiro for 6 weeks now, 3x a  week for adjustments and traction but has had no real changes. Patient presents with positive Spurling's and Distraction test. She also has some postural deficits and increased tightness in her upper traps. Patient will benefit from skilled PT to address her pain and numbness and tingling to be able to complete ADLs without discomfort.   OBJECTIVE IMPAIRMENTS: decreased ROM, decreased strength, postural dysfunction, and pain.   PERSONAL FACTORS: Age, Behavior pattern, and Time since onset of injury/illness/exacerbation are also affecting patient's functional outcome.   REHAB POTENTIAL: Good  CLINICAL DECISION MAKING: Stable/uncomplicated  EVALUATION COMPLEXITY: Low  GOALS: Goals reviewed with patient? Yes  SHORT TERM GOALS: Target date: 02/27/24  Patient will be independent with initial HEP.  Baseline: given 01/23/24 Goal status: ongoing 01/29/24, Met 02/21/24  LONG TERM GOALS: Target date:04/02/24  Patient will be independent with advanced/ongoing HEP to improve outcomes and carryover.  Baseline:  Goal status: MET  2.  Patient will report 75% improvement in L shoulder and arm pain to improve QOL.  Baseline: 5/10 Goal status: Progressed 30% 02/28/24, 3/10 progressing 03/13/24, 03/20/24 60% progressing , 65% progressing 04/01/24  3.  Patient will demonstrate full pain free cervical ROM an no pain with driving.  Baseline:  Goal status: Progressing 02/28/24, MET 03/13/24 no pain just tightness and a pull  4.  Patient will report decreased numbness and  tingling into her hand by 50% Baseline:  Goal status: Progressed 30% 02/28/24, Met 70% 03/13/24  5.  Patient will report decreased tightness in upper traps and cervical/neck and improved posture Baseline: tightness in UT and rounded shoulders Goal status: Ongoing 02/28/24,  Progressed 50% 03/20/24, 60% progressing 03/27/24, "it does feel better" MET   PLAN:  PT FREQUENCY: 2x/week  PT DURATION: 10 weeks  PLANNED INTERVENTIONS:  97110-Therapeutic exercises, 97530- Therapeutic activity, 97112- Neuromuscular re-education, 97535- Self Care, [id number]- Manual therapy, [id number]- Electrical stimulation (unattended), [phone]- Traction (mechanical), Patient/Family education, Taping, Dry Needling, Joint mobilization, Spinal mobilization, Cryotherapy, and Moist heat  PLAN FOR NEXT SESSION: d/c  PHYSICAL THERAPY DISCHARGE SUMMARY  Visits from Start of Care: 18  Patient agrees to discharge. Patient goals were partially met. Patient is being discharged due to being pleased with the current functional level.    [place], PT 04/01/2024, 10:12 AM

## 2024-04-01 ENCOUNTER — Ambulatory Visit

## 2024-04-01 DIAGNOSIS — M5412 Radiculopathy, cervical region: Secondary | ICD-10-CM

## 2024-04-08 ENCOUNTER — Other Ambulatory Visit (INDEPENDENT_AMBULATORY_CARE_PROVIDER_SITE_OTHER)

## 2024-04-08 ENCOUNTER — Ambulatory Visit: Admitting: Physical Therapy

## 2024-04-08 DIAGNOSIS — Z8639 Personal history of other endocrine, nutritional and metabolic disease: Secondary | ICD-10-CM

## 2024-04-08 DIAGNOSIS — D6861 Antiphospholipid syndrome: Secondary | ICD-10-CM

## 2024-04-08 DIAGNOSIS — K760 Fatty (change of) liver, not elsewhere classified: Secondary | ICD-10-CM

## 2024-04-08 DIAGNOSIS — Z1159 Encounter for screening for other viral diseases: Secondary | ICD-10-CM

## 2024-04-08 DIAGNOSIS — I700 Atherosclerosis of aorta: Secondary | ICD-10-CM

## 2024-04-08 DIAGNOSIS — I471 Supraventricular tachycardia, unspecified: Secondary | ICD-10-CM

## 2024-04-08 DIAGNOSIS — I10 Essential (primary) hypertension: Secondary | ICD-10-CM

## 2024-04-08 DIAGNOSIS — E559 Vitamin D deficiency, unspecified: Secondary | ICD-10-CM

## 2024-04-08 DIAGNOSIS — G629 Polyneuropathy, unspecified: Secondary | ICD-10-CM

## 2024-04-08 DIAGNOSIS — K257 Chronic gastric ulcer without hemorrhage or perforation: Secondary | ICD-10-CM

## 2024-04-08 LAB — COMPREHENSIVE METABOLIC PANEL WITH GFR
ALT: 35 U/L (ref 0–35)
AST: 24 U/L (ref 0–37)
Albumin: 4.5 g/dL (ref 3.5–5.2)
Alkaline Phosphatase: 90 U/L (ref 39–117)
BUN: 11 mg/dL (ref 6–23)
CO2: 25 meq/L (ref 19–32)
Calcium: 9.5 mg/dL (ref 8.4–10.5)
Chloride: 102 meq/L (ref 96–112)
Creatinine, Ser: 0.6 mg/dL (ref 0.40–1.20)
GFR: 91.94 mL/min (ref >60.00)
Glucose, Bld: 91 mg/dL (ref 70–99)
Potassium: 4 meq/L (ref 3.5–5.1)
Sodium: 136 meq/L (ref 135–145)
Total Bilirubin: 0.6 mg/dL (ref 0.2–1.2)
Total Protein: 7 g/dL (ref 6.0–8.3)

## 2024-04-08 LAB — CBC WITH DIFFERENTIAL/PLATELET
Basophils Absolute: 0.1 10*3/uL (ref 0.0–0.1)
Basophils Relative: 1 % (ref 0.0–3.0)
Eosinophils Absolute: 0.1 10*3/uL (ref 0.0–0.7)
Eosinophils Relative: 1.1 % (ref 0.0–5.0)
HCT: 42.4 % (ref 36.0–46.0)
Hemoglobin: 14 g/dL (ref 12.0–15.0)
Lymphocytes Relative: 19.7 % (ref 12.0–46.0)
Lymphs Abs: 1.2 10*3/uL (ref 0.7–4.0)
MCHC: 33 g/dL (ref 30.0–36.0)
MCV: 85.5 fl (ref 78.0–100.0)
Monocytes Absolute: 0.6 10*3/uL (ref 0.1–1.0)
Monocytes Relative: 9.7 % (ref 3.0–12.0)
Neutro Abs: 4.3 10*3/uL (ref 1.4–7.7)
Neutrophils Relative %: 68.5 % (ref 43.0–77.0)
Platelets: 290 10*3/uL (ref 150.0–400.0)
RBC: 4.96 Mil/uL (ref 3.87–5.11)
RDW: 13.3 % (ref 11.5–15.5)
WBC: 6.3 10*3/uL (ref 4.0–10.5)

## 2024-04-08 LAB — LIPID PANEL
Cholesterol: 165 mg/dL (ref 0–200)
HDL: 62.2 mg/dL (ref >39.00)
LDL Cholesterol: 80 mg/dL (ref 0–99)
NonHDL: 102.51
Total CHOL/HDL Ratio: 3
Triglycerides: 113 mg/dL (ref 0.0–149.0)
VLDL: 22.6 mg/dL (ref 0.0–40.0)

## 2024-04-08 LAB — HEMOGLOBIN A1C: Hgb A1c MFr Bld: 5.4 % (ref 4.6–6.5)

## 2024-04-08 LAB — MICROALBUMIN / CREATININE URINE RATIO
Creatinine,U: 17.3 mg/dL
Microalb Creat Ratio: UNDETERMINED mg/g (ref 0.0–30.0)
Microalb, Ur: <0.7 mg/dL

## 2024-04-08 LAB — TSH: TSH: 1.09 u[IU]/mL (ref 0.35–5.50)

## 2024-04-08 LAB — B12 AND FOLATE PANEL
Folate: >23.2 ng/mL (ref >5.9)
Vitamin B-12: 826 pg/mL (ref 211–911)

## 2024-04-10 ENCOUNTER — Ambulatory Visit

## 2024-04-11 LAB — PROTEIN ELECTROPHORESIS, SERUM
Albumin ELP: 4.1 g/dL (ref 3.8–4.8)
Alpha 1: 0.3 g/dL (ref 0.2–0.3)
Alpha 2: 0.8 g/dL (ref 0.5–0.9)
Beta 2: 0.3 g/dL (ref 0.2–0.5)
Beta Globulin: 0.4 g/dL (ref 0.4–0.6)
Gamma Globulin: 0.5 g/dL — ABNORMAL LOW (ref 0.8–1.7)
Total Protein: 6.4 g/dL (ref 6.1–8.1)

## 2024-04-11 LAB — URINALYSIS W MICROSCOPIC + REFLEX CULTURE
Bacteria, UA: NONE SEEN /HPF
Bilirubin Urine: NEGATIVE
Glucose, UA: NEGATIVE
Hgb urine dipstick: NEGATIVE
Hyaline Cast: NONE SEEN /LPF
Ketones, ur: NEGATIVE
Leukocyte Esterase: NEGATIVE
Nitrites, Initial: NEGATIVE
Protein, ur: NEGATIVE
RBC / HPF: NONE SEEN /HPF (ref 0–2)
Specific Gravity, Urine: 1.005 (ref 1.001–1.035)
Squamous Epithelial / HPF: NONE SEEN /HPF (ref <=5)
WBC, UA: NONE SEEN /HPF (ref 0–5)
pH: 7 (ref 5.0–8.0)

## 2024-04-11 LAB — IRON,TIBC AND FERRITIN PANEL
%SAT: 23 % (ref 16–45)
Ferritin: 52 ng/mL (ref 16–288)
Iron: 86 ug/dL (ref 45–160)
TIBC: 367 ug/dL (ref 250–450)

## 2024-04-11 LAB — VITAMIN D 1,25 DIHYDROXY
Vitamin D 1, 25 (OH)2 Total: 84 pg/mL — ABNORMAL HIGH (ref 18–72)
Vitamin D2 1, 25 (OH)2: <8 pg/mL
Vitamin D3 1, 25 (OH)2: 84 pg/mL

## 2024-04-11 LAB — HEPATITIS C ANTIBODY: Hepatitis C Ab: NONREACTIVE

## 2024-04-11 LAB — NO CULTURE INDICATED

## 2024-04-14 ENCOUNTER — Ambulatory Visit: Payer: Self-pay | Admitting: Family Medicine

## 2024-04-15 ENCOUNTER — Ambulatory Visit: Admitting: Physical Therapy

## 2024-04-17 ENCOUNTER — Ambulatory Visit

## 2024-04-17 ENCOUNTER — Encounter: Payer: Self-pay | Admitting: Family Medicine

## 2024-04-17 ENCOUNTER — Ambulatory Visit (INDEPENDENT_AMBULATORY_CARE_PROVIDER_SITE_OTHER): Admitting: Family Medicine

## 2024-04-17 VITALS — BP 162/85 | HR 78 | Temp 97.7°F | Ht 64.0 in | Wt 238.0 lb

## 2024-04-17 DIAGNOSIS — Z780 Asymptomatic menopausal state: Secondary | ICD-10-CM | POA: Diagnosis not present

## 2024-04-17 DIAGNOSIS — I10 Essential (primary) hypertension: Secondary | ICD-10-CM

## 2024-04-17 DIAGNOSIS — Z1231 Encounter for screening mammogram for malignant neoplasm of breast: Secondary | ICD-10-CM

## 2024-04-17 MED ORDER — INDAPAMIDE 1.25 MG PO TABS: 1.2500 mg | ORAL_TABLET | Freq: Every day | ORAL | 2 refills | Status: DC

## 2024-04-17 NOTE — Progress Notes (Signed)
 Assessment & Plan   Assessment/Plan:        Hypertension Blood pressure readings at home range from 130s to 140s systolic and 70s to 80s diastolic, with occasional elevations. In-office readings are higher, with today's measurement at 162/85 mmHg. Current medications include amlodipine , lisinopril , and metoprolol . Amlodipine  causes peripheral edema. Considering adding a diuretic to better control blood pressure. Lab work shows no contraindications for diuretic use. Indapamide is considered due to its effectiveness over hydrochlorothiazide . Discussed the need to recheck electrolytes after starting the diuretic to ensure balance and kidney function. - Prescribe indapamide 1.25 mg daily. - Recheck electrolytes in two weeks. - Schedule follow-up in one month to assess blood pressure control. - Instruct her to continue monitoring blood pressure at home and bring the cuff to the next appointment.  Antiphospholipid Syndrome Antiphospholipid syndrome with no current issues related to this condition. Consideration of medication interactions with current treatment plan for hypertension. Reviewed guidelines from the International Society of Thrombosis and Hemostasis to ensure no contraindications with proposed diuretic therapy.  Hyperhidrosis Excessive sweating, especially during physical activity. No significant fluid loss expected to cause internal problems. No treatment changes discussed.  General Health Maintenance Mammogram and bone density scan (DEXA) were ordered but not scheduled due to location issues. Discussed importance of these screenings. Vaccination status reviewed; shingles vaccine recommended as she has had shingles before. Pneumonia and tetanus vaccinations are up to date. Discussed RSV vaccine availability at pharmacies. - Reorder mammogram and DEXA scan. - Consider shingles vaccination. - Consider RSV vaccination at a pharmacy.           There are no discontinued  medications.  Return in about 1 month (around 05/17/2024) for fasting labs in 2 weeks, BP.        Subjective:   Encounter date: 04/17/2024  Kirsten Choi is a 68 y.o. female who has Paroxysmal SVT (supraventricular tachycardia) (HCC); Hypertension; Bradycardia; OSA (obstructive sleep apnea); Dyspnea; Class 2 severe obesity due to excess calories with serious comorbidity and body mass index (BMI) of 39.0 to 39.9 in adult [facility]); Vitamin D  deficiency; Thoracic aortic atherosclerosis (HCC); History of Helicobacter pylori infection; Acute anterior lower chest wall pain; Chronic gastric ulcer without hemorrhage and without perforation; S/P TKR (total knee replacement), left; History of bilateral breast cancer; Metabolic dysfunction-associated steatotic liver disease (MASLD); Cervical radiculopathy due to degenerative joint disease of spine; Antiphospholipid syndrome (HCC); and Upper respiratory tract infection on their problem list..   She  has a past medical history of Arthritis, Cancer (HCC), Hypertension, and Migraine..   She presents with chief complaint of Follow-up (1 month f/u ) .   Discussed the use of AI scribe software for clinical note transcription with the patient, who gave verbal consent to proceed.  History of Present Illness      ROS  Past Surgical History:  Procedure Laterality Date   BREAST SURGERY     COSMETIC SURGERY     HERNIA REPAIR     REPLACEMENT TOTAL KNEE     WRIST SURGERY      Outpatient Medications Prior to Visit  Medication Sig Dispense Refill   amLODipine  (NORVASC ) 5 MG tablet Take 1 tablet (5 mg total) by mouth daily. 90 tablet 3   aspirin  EC 81 MG tablet Take 1 tablet (81 mg total) by mouth daily. Swallow whole. 90 tablet 3   ezetimibe  (ZETIA ) 10 MG tablet Take 1 tablet (10 mg total) by mouth daily. 90 tablet 3   levocetirizine (XYZAL ) 5 MG tablet  Take 1 tablet (5 mg total) by mouth every evening. (Patient taking differently: Take 5 mg by mouth as  needed for allergies.) 90 tablet 3   lisinopril  (ZESTRIL ) 40 MG tablet Take 1 tablet (40 mg total) by mouth daily. 90 tablet 3   metoprolol  succinate (TOPROL -XL) 25 MG 24 hr tablet Take 0.5 tablets (12.5 mg total) by mouth daily. 45 tablet 3   omeprazole  (PRILOSEC) 40 MG capsule Take 1 capsule (40 mg total) by mouth 2 (two) times daily. 180 capsule 3   psyllium (METAMUCIL) 58.6 % packet Take 1 packet by mouth daily.     Cholecalciferol  25 MCG (1000 UT) tablet Take 1 tablet (1,000 Units total) by mouth daily. (Patient not taking: Reported on 04/17/2024) 90 tablet 3   No facility-administered medications prior to visit.    Family History  Problem Relation Age of Onset   Leukemia Mother    Stroke Father    CAD Father    Cancer Father     Social History   Socioeconomic History   Marital status: Widowed    Spouse name: Not on file   Number of children: Not on file   Years of education: Not on file   Highest education level: Not on file  Occupational History   Not on file  Tobacco Use   Smoking status: Never   Smokeless tobacco: Never  Vaping Use   Vaping status: Never Used  Substance and Sexual Activity   Alcohol use: Never   Drug use: Never   Sexual activity: Not on file  Other Topics Concern   Not on file  Social History Narrative   Not on file   Social Drivers of Health   Financial Resource Strain: Unknown (08/15/2022)   Received from [facility]   Overall Financial Resource Strain (CARDIA)  Food Insecurity: Low Risk  (01/16/2024)   Received from [facility]   Hunger Vital Sign    Within the past 12 months, you worried that your food would run out before you got money to buy more: Never true    Within the past 12 months, the food you bought just didn't last and you didn't have money to get more. : Never true  Transportation Needs: No Transportation Needs (01/16/2024)   Received from [facility]    In the past 12 months, has lack of reliable  transportation kept you from medical appointments, meetings, work or from getting things needed for daily living? : No  Physical Activity: Sufficiently Active (08/15/2022)   Received from [employer], [facility] visits prior to 01/06/2023.   Exercise Vital Sign    On average, how many days per week do you engage in moderate to strenuous exercise (like a brisk walk)?: 6 days    On average, how many minutes do you engage in exercise at this level?: 60 min  Stress: No Stress Concern Present (08/15/2022)   Received from [facility] - Occupational Stress Questionnaire  Social Connections: Unknown (08/15/2022)   Received from [facility]   Social Connection and Isolation Panel    In a typical week, how many times do you talk on the phone with family, friends, or neighbors?: More than three times a week    How often do you get together with friends or relatives?: More than three times a week    How often do you attend church or religious services?: More than 4 times per  year    Do you belong to any clubs or organizations such as church groups, unions, fraternal or athletic groups, or school groups?: Yes    How often do you attend meetings of the clubs or organizations you belong to?: More than 4 times per year    Are you married, widowed, divorced, separated, never married, or living with a partner?: Patient declined  Intimate Partner Violence: Unknown (08/15/2022)   Received from [facility] visits prior to 01/06/2023.   Humiliation, Afraid, Rape, and Kick questionnaire    Fear of Current or Ex-Partner: Patient refused    Emotionally Abused: Patient refused    Physically Abused: Patient refused    Sexually Abused: Patient refused                                                                                                  Objective:  Physical Exam: BP (!) 162/85 (BP Location: Left Wrist, Patient  Position: Sitting, Cuff Size: Normal) Comment: patient machine  Pulse 78   Temp 97.7 F (36.5 C) (Temporal)   Ht 5' 4 (1.626 m)   Wt 238 lb (108 kg)   SpO2 98%   BMI 40.85 kg/m    Physical Exam    Physical Exam  No results found.  Recent Results (from the past 2160 hours)  POCT rapid strep A     Status: Normal   Collection Time: 02/15/24  1:18 PM  Result Value Ref Range   Rapid Strep A Screen Negative Negative  POC COVID-19 BinaxNow     Status: Normal   Collection Time: 02/15/24  1:23 PM  Result Value Ref Range   SARS Coronavirus 2 Ag Negative Negative  POCT Influenza A/B     Status: Normal   Collection Time: 02/15/24  1:23 PM  Result Value Ref Range   Influenza A, POC Negative Negative   Influenza B, POC Negative Negative  Iron, TIBC and Ferritin Panel     Status: None   Collection Time: 04/08/24  9:04 AM  Result Value Ref Range   Iron 86 45 - 160 mcg/dL   TIBC 454 098 - 119 mcg/dL (calc)   %SAT 23 16 - 45 % (calc)   Ferritin 52 16 - 288 ng/mL  Protein Electrophoresis, (serum)     Status: Abnormal   Collection Time: 04/08/24  9:04 AM  Result Value Ref Range   Total Protein 6.4 6.1 - 8.1 g/dL   Albumin ELP 4.1 3.8 - 4.8 g/dL   Alpha 1 0.3 0.2 - 0.3 g/dL   Alpha 2 0.8 0.5 - 0.9 g/dL   Beta Globulin 0.4 0.4 - 0.6 g/dL   Beta 2 0.3 0.2 - 0.5 g/dL   Gamma Globulin 0.5 (L) 0.8 - 1.7 g/dL   Abnormal Protein Band1  NONE DETECTED g/dL    Comment: See below   SPE Interp.      Comment: . Consistent with hypogammaglobulinemia. Serum free light  chains or urine immunofixation should be considered if  plasma cell dyscrasias are a possible clinical  diagnosis. .   B12 and  Folate Panel     Status: None   Collection Time: 04/08/24  9:04 AM  Result Value Ref Range   Vitamin B-12 826 211 - 911 pg/mL   Folate >23.2 >5.9 ng/mL  Hepatitis C Antibody     Status: None   Collection Time: 04/08/24  9:04 AM  Result Value Ref Range   Hepatitis C Ab NON-REACTIVE NON-REACTIVE     Comment: . HCV antibody was non-reactive. There is no laboratory  evidence of HCV infection. . In most cases, no further action is required. However, if recent HCV exposure is suspected, a test for HCV RNA (test code 64403) is suggested. . For additional information please refer to http://education.[facility].com/faq/FAQ22v1 (This link is being provided for informational/ educational purposes only.) .   Comprehensive metabolic panel with GFR     Status: None   Collection Time: 04/08/24  9:04 AM  Result Value Ref Range   Sodium 136 135 - 145 mEq/L   Potassium 4.0 3.5 - 5.1 mEq/L   Chloride 102 96 - 112 mEq/L   CO2 25 19 - 32 mEq/L   Glucose, Bld 91 70 - 99 mg/dL   BUN 11 6 - 23 mg/dL   Creatinine, Ser 4.74 0.40 - 1.20 mg/dL   Total Bilirubin 0.6 0.2 - 1.2 mg/dL   Alkaline Phosphatase 90 39 - 117 U/L   AST 24 0 - 37 U/L   ALT 35 0 - 35 U/L   Total Protein 7.0 6.0 - 8.3 g/dL   Albumin 4.5 3.5 - 5.2 g/dL   GFR 25.95 >63.87 mL/min    Comment: Calculated using the CKD-EPI Creatinine Equation (2021)   Calcium 9.5 8.4 - 10.5 mg/dL  CBC with Differential/Platelet     Status: None   Collection Time: 04/08/24  9:04 AM  Result Value Ref Range   WBC 6.3 4.0 - 10.5 K/uL   RBC 4.96 3.87 - 5.11 Mil/uL   Hemoglobin 14.0 12.0 - 15.0 g/dL   HCT 56.4 33.2 - 95.1 %   MCV 85.5 78.0 - 100.0 fl   MCHC 33.0 30.0 - 36.0 g/dL   RDW 88.4 16.6 - 06.3 %   Platelets 290.0 150.0 - 400.0 K/uL   Neutrophils Relative % 68.5 43.0 - 77.0 %   Lymphocytes Relative 19.7 12.0 - 46.0 %   Monocytes Relative 9.7 3.0 - 12.0 %   Eosinophils Relative 1.1 0.0 - 5.0 %   Basophils Relative 1.0 0.0 - 3.0 %   Neutro Abs 4.3 1.4 - 7.7 K/uL   Lymphs Abs 1.2 0.7 - 4.0 K/uL   Monocytes Absolute 0.6 0.1 - 1.0 K/uL   Eosinophils Absolute 0.1 0.0 - 0.7 K/uL   Basophils Absolute 0.1 0.0 - 0.1 K/uL  Urinalysis w microscopic + reflex cultur     Status: None   Collection Time: 04/08/24  9:04 AM   Specimen: Blood   Result Value Ref Range   Color, Urine YELLOW YELLOW   APPearance CLEAR CLEAR   Specific Gravity, Urine 1.005 1.001 - 1.035   pH 7.0 5.0 - 8.0   Glucose, UA NEGATIVE NEGATIVE   Bilirubin Urine NEGATIVE NEGATIVE   Ketones, ur NEGATIVE NEGATIVE   Hgb urine dipstick NEGATIVE NEGATIVE   Protein, ur NEGATIVE NEGATIVE   Nitrites, Initial NEGATIVE NEGATIVE   Leukocyte Esterase NEGATIVE NEGATIVE   WBC, UA NONE SEEN 0 - 5 /HPF   RBC / HPF NONE SEEN 0 - 2 /HPF   Squamous Epithelial / HPF NONE SEEN < OR =  5 /HPF   Bacteria, UA NONE SEEN NONE SEEN /HPF   Hyaline Cast NONE SEEN NONE SEEN /LPF   Note      Comment: This urine was analyzed for the presence of WBC,  RBC, bacteria, casts, and other formed elements.  Only those elements seen were reported. . .   Vitamin D  1,25 dihydroxy     Status: Abnormal   Collection Time: 04/08/24  9:04 AM  Result Value Ref Range   Vitamin D  1, 25 (OH)2 Total 84 (H) 18 - 72 pg/mL   Vitamin D3 1, 25 (OH)2 84 pg/mL   Vitamin D2 1, 25 (OH)2 <8 pg/mL    Comment: (Note) Vitamin D3, 1,25(OH)2 indicates both endogenous  production and supplementation. Vitamin D2, 1,25(OH)2 is  an indicator of exogenous sources, such as diet or  supplementation. Interpretation and therapy are based on  measurement of Vitamin D , 1,25 (OH)2, Total. . This test was developed, and its analytical performance  characteristics have been determined by [employer]. It has not been cleared or approved by the  FDA. This assay has been validated pursuant to the CLIA  regulations and is used for clinical purposes. . For additional information, please refer to http://education.[facility].com/faq/FAQ199 (This link is being provided for  informational/educational purposes only.) . [facility] [address] [place] [address] [phone] Debroah Fanning L. Amaryllis Junior, MD, PhD   Microalbumin / creatinine urine ratio     Status: None   Collection  Time: 04/08/24  9:04 AM  Result Value Ref Range   Microalb, Ur <0.7 mg/dL   Creatinine,U 23.5 mg/dL   Microalb Creat Ratio Unable to calculate 0.0 - 30.0 mg/g    Comment: Unable to Calculate due to Microalbumin Result of <0.7 mg/dL  Hemoglobin T7D     Status: None   Collection Time: 04/08/24  9:04 AM  Result Value Ref Range   Hgb A1c MFr Bld 5.4 4.6 - 6.5 %    Comment: Glycemic Control Guidelines for People with Diabetes:Non Diabetic:  <6%Goal of Therapy: <7%Additional Action Suggested:  >8%   Lipid panel     Status: None   Collection Time: 04/08/24  9:04 AM  Result Value Ref Range   Cholesterol 165 0 - 200 mg/dL    Comment: ATP III Classification       Desirable:  < 200 mg/dL               Borderline High:  200 - 239 mg/dL          High:  > = 220 mg/dL   Triglycerides 254.2 0.0 - 149.0 mg/dL    Comment: Normal:  <706 mg/dLBorderline High:  150 - 199 mg/dL   HDL 23.76 >28.31 mg/dL   VLDL 51.7 0.0 - 61.6 mg/dL   LDL Cholesterol 80 0 - 99 mg/dL   Total CHOL/HDL Ratio 3     Comment:                Men          Women1/2 Average Risk     3.4          3.3Average Risk          5.0          4.42X Average Risk          9.6          7.13X Average Risk          15.0  11.0                       NonHDL 102.51     Comment: NOTE:  Non-HDL goal should be 30 mg/dL higher than patient's LDL goal (i.e. LDL goal of < 70 mg/dL, would have non-HDL goal of < 100 mg/dL)  TSH     Status: None   Collection Time: 04/08/24  9:04 AM  Result Value Ref Range   TSH 1.09 0.35 - 5.50 uIU/mL  REFLEXIVE URINE CULTURE     Status: None   Collection Time: 04/08/24  9:04 AM  Result Value Ref Range   Reflexve Urine Culture      Comment: NO CULTURE INDICATED        Carnell Christian, MD, MS

## 2024-04-17 NOTE — Patient Instructions (Addendum)
  VISIT SUMMARY: Today, we discussed your blood pressure management and followed up on your recent diagnostic tests. Your blood pressure readings at home have been consistently elevated, and we reviewed your current medications and potential adjustments. We also addressed your concerns about drug interactions due to your history of antiphospholipid syndrome and discussed your excessive sweating. Additionally, we reviewed your general health maintenance, including the need for a mammogram and bone density scan, and vaccination updates.  YOUR PLAN: -HYPERTENSION: Hypertension means high blood pressure. Your home readings have been elevated, and today's office reading was 162/85 mmHg. We will add a new medication, indapamide 1.25 mg daily, to help control your blood pressure. Please continue to monitor your blood pressure at home and bring your cuff to the next appointment. We will recheck your electrolytes in two weeks and have a follow-up appointment in one month to assess your blood pressure control.  -ANTIPHOSPHOLIPID SYNDROME: Antiphospholipid syndrome is an autoimmune disorder that increases the risk of blood clots. There are no current issues related to this condition, and we have ensured that the new diuretic medication does not interact with your existing treatment.  -HYPERHIDROSIS: Hyperhidrosis means excessive sweating. You experience significant sweating, especially during physical activity. No treatment changes are needed at this time as it is not causing any internal problems.  -GENERAL HEALTH MAINTENANCE: We discussed the importance of scheduling your mammogram and bone density scan (DEXA), which were previously ordered but not scheduled. Your vaccination status was reviewed, and we recommend considering the shingles vaccine since you have had shingles before. Your pneumonia and tetanus vaccinations are up to date, and we discussed the availability of the RSV vaccine at  pharmacies.  INSTRUCTIONS: Please schedule your mammogram and bone density scan (DEXA) as soon as possible. We will recheck your electrolytes in two weeks after starting the new medication, indapamide. Continue to monitor your blood pressure at home and bring your cuff to the next appointment. Consider getting the shingles and RSV vaccines at a pharmacy. Follow-up appointment in one month to assess blood pressure control.

## 2024-05-02 ENCOUNTER — Other Ambulatory Visit

## 2024-05-02 ENCOUNTER — Telehealth: Payer: Self-pay | Admitting: Family Medicine

## 2024-05-02 NOTE — Telephone Encounter (Signed)
Patient requesting lab orders, please advise

## 2024-05-02 NOTE — Telephone Encounter (Signed)
 Pt said she was told to schedule labs as she checked out but no orders in. She was told when orders are in she will get a call to schedule.

## 2024-05-05 ENCOUNTER — Other Ambulatory Visit: Payer: Self-pay | Admitting: Family Medicine

## 2024-05-05 DIAGNOSIS — I10 Essential (primary) hypertension: Secondary | ICD-10-CM

## 2024-05-05 NOTE — Telephone Encounter (Signed)
 Left voicemail informing patient lab order have been placed and to schedule a lab appt.

## 2024-05-20 ENCOUNTER — Other Ambulatory Visit (INDEPENDENT_AMBULATORY_CARE_PROVIDER_SITE_OTHER)

## 2024-05-20 DIAGNOSIS — I10 Essential (primary) hypertension: Secondary | ICD-10-CM | POA: Diagnosis not present

## 2024-05-20 LAB — BASIC METABOLIC PANEL WITH GFR
BUN: 17 mg/dL (ref 6–23)
CO2: 27 meq/L (ref 19–32)
Calcium: 9.2 mg/dL (ref 8.4–10.5)
Chloride: 99 meq/L (ref 96–112)
Creatinine, Ser: 0.65 mg/dL (ref 0.40–1.20)
GFR: 90.11 mL/min (ref >60.00)
Glucose, Bld: 98 mg/dL (ref 70–99)
Potassium: 4.3 meq/L (ref 3.5–5.1)
Sodium: 135 meq/L (ref 135–145)

## 2024-05-21 ENCOUNTER — Ambulatory Visit (INDEPENDENT_AMBULATORY_CARE_PROVIDER_SITE_OTHER): Admitting: Family Medicine

## 2024-05-21 ENCOUNTER — Encounter: Payer: Self-pay | Admitting: Family Medicine

## 2024-05-21 VITALS — BP 138/88 | HR 62 | Temp 97.3°F | Ht 64.0 in | Wt 236.4 lb

## 2024-05-21 DIAGNOSIS — I1A0 Resistant hypertension: Secondary | ICD-10-CM | POA: Diagnosis not present

## 2024-05-21 DIAGNOSIS — R42 Dizziness and giddiness: Secondary | ICD-10-CM

## 2024-05-21 NOTE — Progress Notes (Signed)
 Assessment & Plan   Assessment/Plan:    Assessment & Plan Hypertension Blood pressure is generally well-controlled at home with occasional elevated readings in-office, likely due to anxiety. Current medications include indapamide , lisinopril , metoprolol , and amlodipine . She experiences occasional lightheadedness, possibly related to medication or postural changes, but it is not worsening. No chest pain or shortness of breath. Kidney function and electrolytes remain stable after the addition of a diuretic. - Continue current antihypertensive regimen. - Encourage hydration, especially during travel to the beach. - Monitor blood pressure and symptoms of lightheadedness.  Increased urinary frequency Increased urinary frequency is likely a side effect of indapamide  and is not bothersome.      There are no discontinued medications.  Return in about 6 months (around 11/21/2024) for blood pressure.        Subjective:   Encounter date: 05/21/2024  Kirsten Choi is a 68 y.o. female who has Paroxysmal SVT (supraventricular tachycardia) (HCC); Hypertension; Bradycardia; OSA (obstructive sleep apnea); Dyspnea; Class 2 severe obesity due to excess calories with serious comorbidity and body mass index (BMI) of 39.0 to 39.9 in adult [facility]); Vitamin D  deficiency; Thoracic aortic atherosclerosis (HCC); History of Helicobacter pylori infection; Acute anterior lower chest wall pain; Chronic gastric ulcer without hemorrhage and without perforation; S/P TKR (total knee replacement), left; History of bilateral breast cancer; Metabolic dysfunction-associated steatotic liver disease (MASLD); Cervical radiculopathy due to degenerative joint disease of spine; Antiphospholipid syndrome (HCC); and Upper respiratory tract infection on their problem list..   She  has a past medical history of Arthritis, Cancer (HCC), Hypertension, and Migraine..   She presents with chief complaint of Hypertension (Follow up) .    Discussed the use of AI scribe software for clinical note transcription with the patient, who gave verbal consent to proceed.  History of Present Illness Kirsten Choi is a 68 year old female with hypertension who presents for a follow-up visit.  She has been monitoring her blood pressure at home, noting that readings are generally lower than those recorded in the clinic, with a recent home reading of 125/59 mmHg. She attributes higher clinic readings to feeling nervous during visits.  Her current medications include lisinopril , metoprolol , amlodipine , and indapamide . She has started checking her blood pressure three times daily to assess medication effects. She experiences occasional lightheadedness, particularly when standing or changing positions, described as mild vertigo. These episodes are infrequent and not worsening.  No chest pain or shortness of breath. She notes increased urination. Recent blood tests for kidney function and electrolytes showed no changes from previous results.  She is planning a trip to the beach for her birthday and a trip to [place] in August. She enjoys walking on the beach in the morning and is looking forward to her travels.     ROS  Past Surgical History:  Procedure Laterality Date   BREAST SURGERY     COSMETIC SURGERY     HERNIA REPAIR     REPLACEMENT TOTAL KNEE     WRIST SURGERY      Outpatient Medications Prior to Visit  Medication Sig Dispense Refill   amLODipine  (NORVASC ) 5 MG tablet Take 1 tablet (5 mg total) by mouth daily. 90 tablet 3   aspirin  EC 81 MG tablet Take 1 tablet (81 mg total) by mouth daily. Swallow whole. 90 tablet 3   clindamycin (CLINDAGEL) 1 % gel Apply 1 Application topically 2 (two) times daily.     ezetimibe  (ZETIA ) 10 MG tablet Take 1 tablet (10 mg total)  by mouth daily. 90 tablet 3   fluticasone  (FLONASE ) 50 MCG/ACT nasal spray Place into both nostrils as needed for allergies or rhinitis.     indapamide  (LOZOL ) 1.25  MG tablet Take 1 tablet (1.25 mg total) by mouth daily. 30 tablet 2   lisinopril  (ZESTRIL ) 40 MG tablet Take 1 tablet (40 mg total) by mouth daily. 90 tablet 3   metoprolol  succinate (TOPROL -XL) 25 MG 24 hr tablet Take 0.5 tablets (12.5 mg total) by mouth daily. 45 tablet 3   omeprazole  (PRILOSEC) 40 MG capsule Take 1 capsule (40 mg total) by mouth 2 (two) times daily. 180 capsule 3   psyllium (METAMUCIL) 58.6 % packet Take 1 packet by mouth daily.     Cholecalciferol  25 MCG (1000 UT) tablet Take 1 tablet (1,000 Units total) by mouth daily. (Patient not taking: Reported on 05/21/2024) 90 tablet 3   levocetirizine (XYZAL ) 5 MG tablet Take 1 tablet (5 mg total) by mouth every evening. (Patient not taking: Reported on 05/21/2024) 90 tablet 3   No facility-administered medications prior to visit.    Family History  Problem Relation Age of Onset   Leukemia Mother    Stroke Father    CAD Father    Cancer Father     Social History   Socioeconomic History   Marital status: Widowed    Spouse name: Not on file   Number of children: Not on file   Years of education: Not on file   Highest education level: Not on file  Occupational History   Not on file  Tobacco Use   Smoking status: Never   Smokeless tobacco: Never  Vaping Use   Vaping status: Never Used  Substance and Sexual Activity   Alcohol use: Never   Drug use: Never   Sexual activity: Not on file  Other Topics Concern   Not on file  Social History Narrative   Not on file   Social Drivers of Health   Financial Resource Strain: Patient Declined (08/15/2022)   Received from [facility]   Overall Financial Resource Strain (CARDIA)    Difficulty of Paying Living Expenses: Patient declined  Food Insecurity: Low Risk  (01/16/2024)   Received from [facility]   Hunger Vital Sign    Within the past 12 months, you worried that your food would run out before you got money to buy more: Never true    Within the past 12 months, the  food you bought just didn't last and you didn't have money to get more. : Never true  Transportation Needs: No Transportation Needs (01/16/2024)   Received from [facility]    In the past 12 months, has lack of reliable transportation kept you from medical appointments, meetings, work or from getting things needed for daily living? : No  Physical Activity: Sufficiently Active (08/15/2022)   Received from [employer], [facility] visits prior to 01/06/2023.   Exercise Vital Sign    On average, how many days per week do you engage in moderate to strenuous exercise (like a brisk walk)?: 6 days    On average, how many minutes do you engage in exercise at this level?: 60 min  Stress: No Stress Concern Present (08/15/2022)   Received from [facility] - Occupational Stress Questionnaire  Social Connections: Unknown (08/15/2022)   Received from [facility]   Social Connection and Isolation Panel    In a typical  week, how many times do you talk on the phone with family, friends, or neighbors?: More than three times a week    How often do you get together with friends or relatives?: More than three times a week    How often do you attend church or religious services?: More than 4 times per year    Do you belong to any clubs or organizations such as church groups, unions, fraternal or athletic groups, or school groups?: Yes    How often do you attend meetings of the clubs or organizations you belong to?: More than 4 times per year    Are you married, widowed, divorced, separated, never married, or living with a partner?: Patient declined  Intimate Partner Violence: Unknown (08/15/2022)   Received from [facility] visits prior to 01/06/2023.   Humiliation, Afraid, Rape, and Kick questionnaire    Fear of Current or Ex-Partner: Patient refused    Emotionally Abused: Patient refused    Physically  Abused: Patient refused    Sexually Abused: Patient refused                                                                                                  Objective:  Physical Exam: BP 138/88 (BP Location: Left Wrist, Patient Position: Sitting, Cuff Size: Large)   Pulse 62   Temp (!) 97.3 F (36.3 C)   Ht 5' 4 (1.626 m)   Wt 236 lb 6.4 oz (107.2 kg)   SpO2 97%   BMI 40.58 kg/m    Physical Exam VITALS: BP- 138/88 GENERAL: Alert, cooperative, well developed, no acute distress HEENT: Normocephalic, normal oropharynx, moist mucous membranes, PERRLA, EOMI CHEST: Clear to auscultation bilaterally, No wheezes, rhonchi, or crackles CARDIOVASCULAR: Normal heart rate and rhythm, S1 and S2 normal without murmurs ABDOMEN: Soft, non-tender, non-distended, without organomegaly, Normal bowel sounds EXTREMITIES: No cyanosis or edema NEUROLOGICAL: Cranial nerves II-XII grossly intact, Moves all extremities without gross motor or sensory deficit   Physical Exam  No results found.  Recent Results (from the past 2160 hours)  Iron, TIBC and Ferritin Panel     Status: None   Collection Time: 04/08/24  9:04 AM  Result Value Ref Range   Iron 86 45 - 160 mcg/dL   TIBC 632 749 - 549 mcg/dL (calc)   %SAT 23 16 - 45 % (calc)   Ferritin 52 16 - 288 ng/mL  Protein Electrophoresis, (serum)     Status: Abnormal   Collection Time: 04/08/24  9:04 AM  Result Value Ref Range   Total Protein 6.4 6.1 - 8.1 g/dL   Albumin ELP 4.1 3.8 - 4.8 g/dL   Alpha 1 0.3 0.2 - 0.3 g/dL   Alpha 2 0.8 0.5 - 0.9 g/dL   Beta Globulin 0.4 0.4 - 0.6 g/dL   Beta 2 0.3 0.2 - 0.5 g/dL   Gamma Globulin 0.5 (L) 0.8 - 1.7 g/dL   Abnormal Protein Band1  NONE DETECTED g/dL    Comment: See below   SPE Interp.      Comment: . Consistent with hypogammaglobulinemia. Serum  free light  chains or urine immunofixation should be considered if  plasma cell dyscrasias are a possible clinical  diagnosis. .   B12 and Folate Panel      Status: None   Collection Time: 04/08/24  9:04 AM  Result Value Ref Range   Vitamin B-12 826 211 - 911 pg/mL   Folate >23.2 >5.9 ng/mL  Hepatitis C Antibody     Status: None   Collection Time: 04/08/24  9:04 AM  Result Value Ref Range   Hepatitis C Ab NON-REACTIVE NON-REACTIVE    Comment: . HCV antibody was non-reactive. There is no laboratory  evidence of HCV infection. . In most cases, no further action is required. However, if recent HCV exposure is suspected, a test for HCV RNA (test code 64354) is suggested. . For additional information please refer to http://education.[facility].com/faq/FAQ22v1 (This link is being provided for informational/ educational purposes only.) .   Comprehensive metabolic panel with GFR     Status: None   Collection Time: 04/08/24  9:04 AM  Result Value Ref Range   Sodium 136 135 - 145 mEq/L   Potassium 4.0 3.5 - 5.1 mEq/L   Chloride 102 96 - 112 mEq/L   CO2 25 19 - 32 mEq/L   Glucose, Bld 91 70 - 99 mg/dL   BUN 11 6 - 23 mg/dL   Creatinine, Ser 9.39 0.40 - 1.20 mg/dL   Total Bilirubin 0.6 0.2 - 1.2 mg/dL   Alkaline Phosphatase 90 39 - 117 U/L   AST 24 0 - 37 U/L   ALT 35 0 - 35 U/L   Total Protein 7.0 6.0 - 8.3 g/dL   Albumin 4.5 3.5 - 5.2 g/dL   GFR 08.05 >39.99 mL/min    Comment: Calculated using the CKD-EPI Creatinine Equation (2021)   Calcium 9.5 8.4 - 10.5 mg/dL  CBC with Differential/Platelet     Status: None   Collection Time: 04/08/24  9:04 AM  Result Value Ref Range   WBC 6.3 4.0 - 10.5 K/uL   RBC 4.96 3.87 - 5.11 Mil/uL   Hemoglobin 14.0 12.0 - 15.0 g/dL   HCT 57.5 63.9 - 53.9 %   MCV 85.5 78.0 - 100.0 fl   MCHC 33.0 30.0 - 36.0 g/dL   RDW 86.6 88.4 - 84.4 %   Platelets 290.0 150.0 - 400.0 K/uL   Neutrophils Relative % 68.5 43.0 - 77.0 %   Lymphocytes Relative 19.7 12.0 - 46.0 %   Monocytes Relative 9.7 3.0 - 12.0 %   Eosinophils Relative 1.1 0.0 - 5.0 %   Basophils Relative 1.0 0.0 - 3.0 %   Neutro Abs 4.3  1.4 - 7.7 K/uL   Lymphs Abs 1.2 0.7 - 4.0 K/uL   Monocytes Absolute 0.6 0.1 - 1.0 K/uL   Eosinophils Absolute 0.1 0.0 - 0.7 K/uL   Basophils Absolute 0.1 0.0 - 0.1 K/uL  Urinalysis w microscopic + reflex cultur     Status: None   Collection Time: 04/08/24  9:04 AM   Specimen: Blood  Result Value Ref Range   Color, Urine YELLOW YELLOW   APPearance CLEAR CLEAR   Specific Gravity, Urine 1.005 1.001 - 1.035   pH 7.0 5.0 - 8.0   Glucose, UA NEGATIVE NEGATIVE   Bilirubin Urine NEGATIVE NEGATIVE   Ketones, ur NEGATIVE NEGATIVE   Hgb urine dipstick NEGATIVE NEGATIVE   Protein, ur NEGATIVE NEGATIVE   Nitrites, Initial NEGATIVE NEGATIVE   Leukocyte Esterase NEGATIVE NEGATIVE   WBC, UA NONE  SEEN 0 - 5 /HPF   RBC / HPF NONE SEEN 0 - 2 /HPF   Squamous Epithelial / HPF NONE SEEN < OR = 5 /HPF   Bacteria, UA NONE SEEN NONE SEEN /HPF   Hyaline Cast NONE SEEN NONE SEEN /LPF   Note      Comment: This urine was analyzed for the presence of WBC,  RBC, bacteria, casts, and other formed elements.  Only those elements seen were reported. . .   Vitamin D  1,25 dihydroxy     Status: Abnormal   Collection Time: 04/08/24  9:04 AM  Result Value Ref Range   Vitamin D  1, 25 (OH)2 Total 84 (H) 18 - 72 pg/mL   Vitamin D3 1, 25 (OH)2 84 pg/mL   Vitamin D2 1, 25 (OH)2 <8 pg/mL    Comment: (Note) Vitamin D3, 1,25(OH)2 indicates both endogenous  production and supplementation. Vitamin D2, 1,25(OH)2 is  an indicator of exogenous sources, such as diet or  supplementation. Interpretation and therapy are based on  measurement of Vitamin D , 1,25 (OH)2, Total. . This test was developed, and its analytical performance  characteristics have been determined by [employer]. It has not been cleared or approved by the  FDA. This assay has been validated pursuant to the CLIA  regulations and is used for clinical purposes. . For additional information, please refer  to http://education.[facility].com/faq/FAQ199 (This link is being provided for  informational/educational purposes only.) . [facility] [address] [place] [address] [phone] Johanna Agent L. Gino, MD, PhD   Microalbumin / creatinine urine ratio     Status: None   Collection Time: 04/08/24  9:04 AM  Result Value Ref Range   Microalb, Ur <0.7 mg/dL   Creatinine,U 82.6 mg/dL   Microalb Creat Ratio Unable to calculate 0.0 - 30.0 mg/g    Comment: Unable to Calculate due to Microalbumin Result of <0.7 mg/dL  Hemoglobin J8r     Status: None   Collection Time: 04/08/24  9:04 AM  Result Value Ref Range   Hgb A1c MFr Bld 5.4 4.6 - 6.5 %    Comment: Glycemic Control Guidelines for People with Diabetes:Non Diabetic:  <6%Goal of Therapy: <7%Additional Action Suggested:  >8%   Lipid panel     Status: None   Collection Time: 04/08/24  9:04 AM  Result Value Ref Range   Cholesterol 165 0 - 200 mg/dL    Comment: ATP III Classification       Desirable:  < 200 mg/dL               Borderline High:  200 - 239 mg/dL          High:  > = 759 mg/dL   Triglycerides 886.9 0.0 - 149.0 mg/dL    Comment: Normal:  <849 mg/dLBorderline High:  150 - 199 mg/dL   HDL 37.79 >60.99 mg/dL   VLDL 77.3 0.0 - 59.9 mg/dL   LDL Cholesterol 80 0 - 99 mg/dL   Total CHOL/HDL Ratio 3     Comment:                Men          Women1/2 Average Risk     3.4          3.3Average Risk          5.0          4.42X Average Risk  9.6          7.13X Average Risk          15.0          11.0                       NonHDL 102.51     Comment: NOTE:  Non-HDL goal should be 30 mg/dL higher than patient's LDL goal (i.e. LDL goal of < 70 mg/dL, would have non-HDL goal of < 100 mg/dL)  TSH     Status: None   Collection Time: 04/08/24  9:04 AM  Result Value Ref Range   TSH 1.09 0.35 - 5.50 uIU/mL  REFLEXIVE URINE CULTURE     Status: None   Collection Time: 04/08/24  9:04 AM  Result Value Ref  Range   Reflexve Urine Culture      Comment: NO CULTURE INDICATED  Basic Metabolic Panel (BMET)     Status: None   Collection Time: 05/20/24  8:40 AM  Result Value Ref Range   Sodium 135 135 - 145 mEq/L   Potassium 4.3 3.5 - 5.1 mEq/L   Chloride 99 96 - 112 mEq/L   CO2 27 19 - 32 mEq/L   Glucose, Bld 98 70 - 99 mg/dL   BUN 17 6 - 23 mg/dL   Creatinine, Ser 9.34 0.40 - 1.20 mg/dL   GFR 09.88 >39.99 mL/min    Comment: Calculated using the CKD-EPI Creatinine Equation (2021)   Calcium 9.2 8.4 - 10.5 mg/dL        Beverley Adine Hummer, MD, MS

## 2024-05-22 ENCOUNTER — Ambulatory Visit: Admitting: Family Medicine

## 2024-05-23 ENCOUNTER — Ambulatory Visit: Payer: Self-pay

## 2024-05-23 ENCOUNTER — Telehealth: Payer: Self-pay | Admitting: Family Medicine

## 2024-05-23 DIAGNOSIS — N939 Abnormal uterine and vaginal bleeding, unspecified: Secondary | ICD-10-CM

## 2024-05-23 NOTE — Telephone Encounter (Signed)
 FYI Only or Action Required?: Action required by provider: requesting GYN referral for vaginal bleeding.  Patient was last seen in primary care on 05/21/2024 by Sebastian Beverley NOVAK, MD.  Called Nurse Triage reporting Vaginal Bleeding.  Symptoms began today.  Interventions attempted: Nothing.  Symptoms are: unchanged.  Triage Disposition: Home Care  Patient/caregiver understands and will follow disposition?: Yes     Copied from CRM [id number]. Topic: Clinical - Red Word Triage >> May 23, 2024  8:25 AM Kirsten Choi wrote: Red Word that prompted transfer to Nurse Triage: patient states she has been spotting since last week which is not normal Reason for Disposition  [1] Bleeding or spotting between regular periods AND [2] occurs three cycles (3 months) or less this past year    Spotting/bleeding noted x 3 times within 2 weeks after not having a cycle since the age of 68 years old  Answer Assessment - Initial Assessment Questions 1. BLEEDING SEVERITY: Describe the bleeding that you are having. How much bleeding is there?      Spotting without clots - small amount of bleeding 2. ONSET: When did the bleeding begin? Is it continuing now?     Yesterday x2 times and last week occurred x1 3. MENSTRUAL PERIOD: When was the last normal menstrual period? How is this different than your period?     68 years old was last period 4. REGULARITY: How regular are your periods?     na 5. ABDOMEN PAIN: Do you have any pain? How bad is the pain?  (e.g., Scale 0-10; none, mild, moderate, or severe)     Cramping comes and goes. 6. PREGNANCY: Is there any chance you are pregnant? When was your last menstrual period?     na 7. BREASTFEEDING: Are you breastfeeding?     na 8. HORMONE MEDICINES: Are you taking any hormone medicines, prescription or over-the-counter? (e.g., birth control pills, estrogen)     no 9. BLOOD THINNER MEDICINES: Do you take any blood thinners? (e.g., Coumadin  / warfarin, Pradaxa / dabigatran, aspirin )     na 10. CAUSE: What do you think is causing the bleeding? (e.g., recent gyn surgery, recent gyn procedure; known bleeding disorder, cervical cancer, polycystic ovarian disease, fibroids)         na 11. HEMODYNAMIC STATUS: Are you weak or feeling lightheaded? If Yes, ask: Can you stand and walk normally?        na 12. OTHER SYMPTOMS: What other symptoms are you having with the bleeding? (e.g., passed tissue, vaginal discharge, fever, menstrual-type cramps)       Vaginal bleeding/spotting  Protocols used: Vaginal Bleeding - Abnormal-A-AH

## 2024-05-23 NOTE — Telephone Encounter (Signed)
 Copied from CRM [id number]. Topic: General - Other >> May 23, 2024  3:16 PM Gennette ORN wrote: Reason for CRM: Patient is calling because she found a dr and her appointment is 06/04/24.

## 2024-05-28 NOTE — Addendum Note (Signed)
 Addended by: SEBASTIAN RIGHTER B on: 05/28/2024 01:12 PM   Modules accepted: Orders

## 2024-05-28 NOTE — Telephone Encounter (Signed)
 Spoke with pt and relayed referral sent to OB. Pt stated already have an appt.

## 2024-06-02 ENCOUNTER — Ambulatory Visit (HOSPITAL_BASED_OUTPATIENT_CLINIC_OR_DEPARTMENT_OTHER)
Admission: RE | Admit: 2024-06-02 | Discharge: 2024-06-02 | Disposition: A | Discharge status: Home / Self Care | Source: Ambulatory Visit | Attending: Family Medicine | Admitting: Family Medicine

## 2024-06-02 DIAGNOSIS — Z780 Asymptomatic menopausal state: Secondary | ICD-10-CM | POA: Insufficient documentation

## 2024-06-04 ENCOUNTER — Other Ambulatory Visit (HOSPITAL_COMMUNITY)
Admission: RE | Admit: 2024-06-04 | Discharge: 2024-06-04 | Disposition: A | Discharge status: Home / Self Care | Source: Ambulatory Visit | Attending: Obstetrics and Gynecology | Admitting: Obstetrics and Gynecology

## 2024-06-04 ENCOUNTER — Ambulatory Visit (INDEPENDENT_AMBULATORY_CARE_PROVIDER_SITE_OTHER): Admitting: Obstetrics and Gynecology

## 2024-06-04 ENCOUNTER — Encounter: Payer: Self-pay | Admitting: Obstetrics and Gynecology

## 2024-06-04 VITALS — BP 128/80 | HR 79 | Temp 97.9°F | Ht 63.25 in | Wt 235.0 lb

## 2024-06-04 DIAGNOSIS — N950 Postmenopausal bleeding: Secondary | ICD-10-CM | POA: Diagnosis not present

## 2024-06-04 DIAGNOSIS — L900 Lichen sclerosus et atrophicus: Secondary | ICD-10-CM | POA: Insufficient documentation

## 2024-06-04 DIAGNOSIS — Z124 Encounter for screening for malignant neoplasm of cervix: Secondary | ICD-10-CM | POA: Insufficient documentation

## 2024-06-04 DIAGNOSIS — Z1151 Encounter for screening for human papillomavirus (HPV): Secondary | ICD-10-CM | POA: Insufficient documentation

## 2024-06-04 DIAGNOSIS — Z01419 Encounter for gynecological examination (general) (routine) without abnormal findings: Secondary | ICD-10-CM | POA: Diagnosis present

## 2024-06-04 MED ORDER — CLOBETASOL PROPIONATE 0.05 % EX OINT: 1.0000 | TOPICAL_OINTMENT | Freq: Every day | CUTANEOUS | 3 refills | Status: DC

## 2024-06-04 NOTE — Assessment & Plan Note (Signed)
 Reviewed with patient that lichen sclerosus is a chronic inflammatory condition of the skin that carries an increased risk of squamous cell carcinoma. Initial treatment is with chronic topical steroid.  Recommend daily clobetasol  for 3 months, followed by reassessment of the genitourinary area. Return office in 3 months.

## 2024-06-04 NOTE — Progress Notes (Signed)
 68 y.o. G2P0111 female with APLS, hx of bilateral breast cancer (dx 2020, BL reconstruction, ER+) and other comorbidities here for PMB. Widowed.   No LMP recorded. Patient is postmenopausal.  She reports she noticed some blood in underwear & when wiping. Not having any bleeding right now. Started a few month ago, has happened 3x. Some pelvic cramping. No dysuria. +occasional constipation. Known hemorrhoid. Last GYN care ~31yr ago, GYN retired.  GYN HISTORY: As noted  OB History  Gravida Para Term Preterm AB Living  2 1  1 1 1   SAB IAB Ectopic Multiple Live Births  1        # Outcome Date GA Lbr Len/2nd Weight Sex Type Anes PTL Lv  2 SAB           1 Preterm            Past Medical History:  Diagnosis Date   Arthritis    Cancer (HCC)    breast CA (resolved)   Hypertension    Migraine    Past Surgical History:  Procedure Laterality Date   BREAST SURGERY     CHOLECYSTECTOMY     COSMETIC SURGERY     HERNIA REPAIR     per pt-did not have   REPLACEMENT TOTAL KNEE     WRIST SURGERY     Current Outpatient Medications on File Prior to Visit  Medication Sig Dispense Refill   amLODipine  (NORVASC ) 5 MG tablet Take 1 tablet (5 mg total) by mouth daily. 90 tablet 3   aspirin  EC 81 MG tablet Take 1 tablet (81 mg total) by mouth daily. Swallow whole. 90 tablet 3   ezetimibe  (ZETIA ) 10 MG tablet Take 1 tablet (10 mg total) by mouth daily. 90 tablet 3   fluticasone  (FLONASE ) 50 MCG/ACT nasal spray Place into both nostrils as needed for allergies or rhinitis.     indapamide  (LOZOL ) 1.25 MG tablet Take 1 tablet (1.25 mg total) by mouth daily. 30 tablet 2   lisinopril  (ZESTRIL ) 40 MG tablet Take 1 tablet (40 mg total) by mouth daily. 90 tablet 3   metoprolol  succinate (TOPROL -XL) 25 MG 24 hr tablet Take 0.5 tablets (12.5 mg total) by mouth daily. 45 tablet 3   Multiple Vitamin (MULTIVITAMIN PO) Take by mouth.     psyllium (METAMUCIL) 58.6 % packet Take 1 packet by mouth daily.     No  current facility-administered medications on file prior to visit.   Allergies  Allergen Reactions   Nickel Dermatitis   Nsaids Other (See Comments)    Antiphospholipid syndrome and gastric ulcers   Sertraline Other (See Comments)   Statins Other (See Comments)    Liver damage   Tape       PE Today's Vitals   06/04/24 0738  BP: 128/80  Pulse: 79  Temp: 97.9 F (36.6 C)  TempSrc: Oral  SpO2: 97%  Weight: 235 lb (106.6 kg)  Height: 5' 3.25 (1.607 m)   Body mass index is 41.3 kg/m.  Physical Exam Vitals reviewed. Exam conducted with a chaperone present.  Constitutional:      General: She is not in acute distress.    Appearance: Normal appearance.  HENT:     Head: Normocephalic and atraumatic.     Nose: Nose normal.  Eyes:     Extraocular Movements: Extraocular movements intact.     Conjunctiva/sclera: Conjunctivae normal.  Pulmonary:     Effort: Pulmonary effort is normal.  Genitourinary:    General: Normal  vulva.     Exam position: Lithotomy position.     Vagina: Normal. No vaginal discharge.     Cervix: Normal. No cervical motion tenderness, discharge or lesion.     Uterus: Normal. Not enlarged and not tender.      Adnexa: Right adnexa normal and left adnexa normal.     Comments: Atrophy and hypopigmentation noted. Fusion of upper labia Musculoskeletal:        General: Normal range of motion.     Cervical back: Normal range of motion.  Neurological:     General: No focal deficit present.     Mental Status: She is alert.  Psychiatric:        Mood and Affect: Mood normal.        Behavior: Behavior normal.       Assessment and Plan:        PMB (postmenopausal bleeding) Assessment & Plan: Reviewed causes of PMB, including genitourinary syndrome of menopause, infection, trauma, polyps, urinary and GI etiologies, and malignancy. Recommend endometrial assessment with transvaginal ultrasound or endometrial sampling- via EMB or diagnostic hysteroscopy. Reviewed  that she may still require endometrial sampling if TVUS is abnormal. Patient elects for TVUS. Traveling in 2 wk, order placed for external US  to assist with scheduling prior to trip RTO in 4wk for results discussion and possible EMB   Orders: -     US  PELVIS TRANSVAGINAL NON-OB (TV ONLY); Future -     Endometrial biopsy; Future  Cervical cancer screening -     Cytology - PAP  Lichen sclerosus et atrophicus Assessment & Plan: Reviewed with patient that lichen sclerosus is a chronic inflammatory condition of the skin that carries an increased risk of squamous cell carcinoma. Initial treatment is with chronic topical steroid.  Recommend daily clobetasol  for 3 months, followed by reassessment of the genitourinary area. Return office in 3 months.   Orders: -     Clobetasol  Propionate; Apply 1 Application topically daily.  Dispense: 90 g; Refill: 3    Vera LULLA Pa, MD

## 2024-06-04 NOTE — Assessment & Plan Note (Signed)
 Reviewed causes of PMB, including genitourinary syndrome of menopause, infection, trauma, polyps, urinary and GI etiologies, and malignancy. Recommend endometrial assessment with transvaginal ultrasound or endometrial sampling- via EMB or diagnostic hysteroscopy. Reviewed that she may still require endometrial sampling if TVUS is abnormal. Patient elects for TVUS. Traveling in 2 wk, order placed for external US  to assist with scheduling prior to trip RTO in 4wk for results discussion and possible EMB

## 2024-06-04 NOTE — Addendum Note (Signed)
 Addended by: DALLIE VERA GAILS on: 06/04/2024 08:56 AM   Modules accepted: Orders

## 2024-06-05 ENCOUNTER — Ambulatory Visit: Admitting: Family Medicine

## 2024-06-08 LAB — CYTOLOGY - PAP
Comment: NEGATIVE
Diagnosis: NEGATIVE
High risk HPV: NEGATIVE

## 2024-06-09 ENCOUNTER — Ambulatory Visit: Payer: Self-pay | Admitting: Obstetrics and Gynecology

## 2024-06-10 ENCOUNTER — Ambulatory Visit (INDEPENDENT_AMBULATORY_CARE_PROVIDER_SITE_OTHER)

## 2024-06-10 ENCOUNTER — Other Ambulatory Visit: Payer: Self-pay | Admitting: Obstetrics and Gynecology

## 2024-06-10 ENCOUNTER — Ambulatory Visit: Payer: Self-pay | Admitting: Family Medicine

## 2024-06-10 DIAGNOSIS — N950 Postmenopausal bleeding: Secondary | ICD-10-CM

## 2024-06-10 DIAGNOSIS — L900 Lichen sclerosus et atrophicus: Secondary | ICD-10-CM

## 2024-06-10 DIAGNOSIS — Z124 Encounter for screening for malignant neoplasm of cervix: Secondary | ICD-10-CM

## 2024-06-11 ENCOUNTER — Ambulatory Visit: Payer: Self-pay | Admitting: Obstetrics and Gynecology

## 2024-06-16 ENCOUNTER — Encounter (HOSPITAL_BASED_OUTPATIENT_CLINIC_OR_DEPARTMENT_OTHER): Payer: Self-pay

## 2024-06-16 ENCOUNTER — Ambulatory Visit (HOSPITAL_BASED_OUTPATIENT_CLINIC_OR_DEPARTMENT_OTHER)
Admission: RE | Admit: 2024-06-16 | Discharge: 2024-06-16 | Disposition: A | Discharge status: Home / Self Care | Source: Ambulatory Visit | Attending: Family Medicine | Admitting: Family Medicine

## 2024-06-16 DIAGNOSIS — Z1231 Encounter for screening mammogram for malignant neoplasm of breast: Secondary | ICD-10-CM | POA: Insufficient documentation

## 2024-06-16 DIAGNOSIS — Z780 Asymptomatic menopausal state: Secondary | ICD-10-CM | POA: Insufficient documentation

## 2024-07-04 NOTE — Telephone Encounter (Signed)
 Patient left message on triage line, states she can see PAP and PUS was normal, requesting to cancel 07/08/24 visit for EMB, not necessary that she can see.   Dr. Dallie -can you review and advise.

## 2024-07-08 ENCOUNTER — Ambulatory Visit: Admitting: Obstetrics and Gynecology

## 2024-07-12 ENCOUNTER — Other Ambulatory Visit: Payer: Self-pay | Admitting: Family Medicine

## 2024-07-12 DIAGNOSIS — I10 Essential (primary) hypertension: Secondary | ICD-10-CM

## 2024-07-21 ENCOUNTER — Encounter

## 2024-07-29 ENCOUNTER — Emergency Department (HOSPITAL_BASED_OUTPATIENT_CLINIC_OR_DEPARTMENT_OTHER)

## 2024-07-29 ENCOUNTER — Other Ambulatory Visit: Payer: Self-pay

## 2024-07-29 ENCOUNTER — Encounter (HOSPITAL_BASED_OUTPATIENT_CLINIC_OR_DEPARTMENT_OTHER): Payer: Self-pay | Admitting: Urology

## 2024-07-29 ENCOUNTER — Emergency Department (HOSPITAL_BASED_OUTPATIENT_CLINIC_OR_DEPARTMENT_OTHER)
Admission: EM | Admit: 2024-07-29 | Discharge: 2024-07-30 | Disposition: A | Discharge status: Home / Self Care | Attending: Emergency Medicine | Admitting: Emergency Medicine

## 2024-07-29 DIAGNOSIS — Z7982 Long term (current) use of aspirin: Secondary | ICD-10-CM | POA: Diagnosis not present

## 2024-07-29 DIAGNOSIS — I10 Essential (primary) hypertension: Secondary | ICD-10-CM | POA: Insufficient documentation

## 2024-07-29 DIAGNOSIS — R002 Palpitations: Secondary | ICD-10-CM | POA: Diagnosis not present

## 2024-07-29 DIAGNOSIS — Z79899 Other long term (current) drug therapy: Secondary | ICD-10-CM | POA: Diagnosis not present

## 2024-07-29 DIAGNOSIS — R55 Syncope and collapse: Secondary | ICD-10-CM | POA: Insufficient documentation

## 2024-07-29 DIAGNOSIS — R42 Dizziness and giddiness: Secondary | ICD-10-CM | POA: Diagnosis present

## 2024-07-29 LAB — APTT: aPTT: 35 s (ref 24–36)

## 2024-07-29 LAB — DIFFERENTIAL
Abs Immature Granulocytes: 0.1 K/uL — ABNORMAL HIGH (ref 0.00–0.07)
Basophils Absolute: 0.1 K/uL (ref 0.0–0.1)
Basophils Relative: 1 %
Eosinophils Absolute: 0.1 K/uL (ref 0.0–0.5)
Eosinophils Relative: 1 %
Immature Granulocytes: 1 %
Lymphocytes Relative: 16 %
Lymphs Abs: 1.5 K/uL (ref 0.7–4.0)
Monocytes Absolute: 0.9 K/uL (ref 0.1–1.0)
Monocytes Relative: 10 %
Neutro Abs: 6.6 K/uL (ref 1.7–7.7)
Neutrophils Relative %: 71 %

## 2024-07-29 LAB — COMPREHENSIVE METABOLIC PANEL WITH GFR
ALT: 37 U/L (ref 0–44)
AST: 26 U/L (ref 15–41)
Albumin: 4.9 g/dL (ref 3.5–5.0)
Alkaline Phosphatase: 108 U/L (ref 38–126)
Anion gap: 14 (ref 5–15)
BUN: 13 mg/dL (ref 8–23)
CO2: 23 mmol/L (ref 22–32)
Calcium: 9.7 mg/dL (ref 8.9–10.3)
Chloride: 102 mmol/L (ref 98–111)
Creatinine, Ser: 0.7 mg/dL (ref 0.44–1.00)
GFR, Estimated: >60 mL/min (ref >60)
Glucose, Bld: 124 mg/dL — ABNORMAL HIGH (ref 70–99)
Potassium: 4.2 mmol/L (ref 3.5–5.1)
Sodium: 139 mmol/L (ref 135–145)
Total Bilirubin: 0.4 mg/dL (ref 0.0–1.2)
Total Protein: 7.2 g/dL (ref 6.5–8.1)

## 2024-07-29 LAB — CBC
HCT: 44 % (ref 36.0–46.0)
Hemoglobin: 14.7 g/dL (ref 12.0–15.0)
MCH: 28.2 pg (ref 26.0–34.0)
MCHC: 33.4 g/dL (ref 30.0–36.0)
MCV: 84.3 fL (ref 80.0–100.0)
Platelets: 303 K/uL (ref 150–400)
RBC: 5.22 MIL/uL — ABNORMAL HIGH (ref 3.87–5.11)
RDW: 12.7 % (ref 11.5–15.5)
WBC: 9.3 K/uL (ref 4.0–10.5)
nRBC: 0 % (ref 0.0–0.2)

## 2024-07-29 LAB — ETHANOL: Alcohol, Ethyl (B): <15 mg/dL (ref <15)

## 2024-07-29 LAB — TROPONIN T, HIGH SENSITIVITY: Troponin T High Sensitivity: <15 ng/L (ref 0–19)

## 2024-07-29 LAB — CBG MONITORING, ED: Glucose-Capillary: 137 mg/dL — ABNORMAL HIGH (ref 70–99)

## 2024-07-29 LAB — PROTIME-INR
INR: 0.9 (ref 0.8–1.2)
Prothrombin Time: 13.2 s (ref 11.4–15.2)

## 2024-07-29 MED ORDER — MECLIZINE HCL 25 MG PO TABS
25.0000 mg | ORAL_TABLET | Freq: Once | ORAL | Status: AC
  Administered 2024-07-30: 25 mg via ORAL
  Filled 2024-07-29: qty 1

## 2024-07-29 MED ORDER — ACETAMINOPHEN 325 MG PO TABS
650.0000 mg | ORAL_TABLET | Freq: Once | ORAL | Status: DC
  Filled 2024-07-29: qty 2

## 2024-07-29 MED ORDER — IOHEXOL 350 MG/ML SOLN
75.0000 mL | Freq: Once | INTRAVENOUS | Status: AC | PRN
  Administered 2024-07-29: 75 mL via INTRAVENOUS

## 2024-07-29 NOTE — ED Provider Notes (Signed)
 [facility] Provider Note   CSN: [id number] Arrival date & time: 07/29/24  2232     Patient presents with: Dizziness   Kirsten Choi is a 68 y.o. female.  {Add pertinent medical, surgical, social history, OB history to YEP:67052} Patient with a history of hypertension, migraines, previous vertigo here with room spinning dizziness.  Reports something came over me all of a sudden the 8 PM while she was resting.  She reports feeling off balance, room spinning dizziness worse with position change and difficulty walking having to use a cane which she does not normally do.  Denies any blurry vision or double vision.  Denies any focal weakness to her arms or legs.  No facial droop.  No difficulty speaking or difficulty swallowing.  Feels her heart racing but no chest pain or shortness of breath.  Nausea but no vomiting.  Since the dizziness started she has developed a severe posterior headache that came on rather suddenly.  States that is not the worst headache she ever had however.  Is not certain if it came on all of a sudden.  Denies any blood thinner use. States she is having to use her cane when she does not normally do.  Feels off balance and feels like she has difficulty walking compared to normal.  The history is provided by the patient.  Dizziness Associated symptoms: headaches, nausea and palpitations   Associated symptoms: no shortness of breath and no vomiting        Prior to Admission medications   Medication Sig Start Date End Date Taking? Authorizing Provider  amLODipine  (NORVASC ) 5 MG tablet Take 1 tablet (5 mg total) by mouth daily. 03/13/24 03/08/25  Sebastian Beverley NOVAK, MD  aspirin  EC 81 MG tablet Take 1 tablet (81 mg total) by mouth daily. Swallow whole. 03/13/24 03/08/25  Sebastian Beverley NOVAK, MD  clobetasol  ointment (TEMOVATE ) 0.05 % Apply 1 Application topically daily. 06/04/24 09/02/24  Dallie Vera GAILS, MD  ezetimibe  (ZETIA ) 10 MG  tablet Take 1 tablet (10 mg total) by mouth daily. 03/13/24 03/08/25  Sebastian Beverley NOVAK, MD  fluticasone  (FLONASE ) 50 MCG/ACT nasal spray Place into both nostrils as needed for allergies or rhinitis.    [provider]  indapamide  (LOZOL ) 1.25 MG tablet TAKE 1 TABLET BY MOUTH DAILY. 07/14/24   Sebastian Beverley NOVAK, MD  lisinopril  (ZESTRIL ) 40 MG tablet Take 1 tablet (40 mg total) by mouth daily. 03/13/24 03/08/25  Sebastian Beverley NOVAK, MD  metoprolol  succinate (TOPROL -XL) 25 MG 24 hr tablet Take 0.5 tablets (12.5 mg total) by mouth daily. 03/13/24 03/13/25  Sebastian Beverley NOVAK, MD  Multiple Vitamin (MULTIVITAMIN PO) Take by mouth.    [provider]  psyllium (METAMUCIL) 58.6 % packet Take 1 packet by mouth daily.    [provider]    Allergies: Nickel, Nsaids, Sertraline, Statins, and Tape    Review of Systems  Constitutional:  Negative for activity change, appetite change and fever.  HENT:  Negative for congestion and rhinorrhea.   Respiratory:  Negative for cough, chest tightness and shortness of breath.   Cardiovascular:  Positive for palpitations.  Gastrointestinal:  Positive for nausea. Negative for abdominal pain and vomiting.  Genitourinary:  Negative for dysuria and hematuria.  Musculoskeletal:  Negative for arthralgias and myalgias.  Skin:  Negative for rash.  Neurological:  Positive for dizziness and headaches.   all other systems are negative except as noted in the HPI and PMH.  Updated Vital Signs BP (!) 189/94 (BP Location: Left Arm)   Pulse 84   Temp 97.9 F (36.6 C) (Oral)   Resp 18   Ht 5' 3 (1.6 m)   Wt 106.6 kg   SpO2 95%   BMI 41.63 kg/m   Physical Exam Vitals and nursing note reviewed.  Constitutional:      General: She is not in acute distress.    Appearance: She is well-developed.  HENT:     Head: Normocephalic and atraumatic.     Mouth/Throat:     Pharynx: No oropharyngeal exudate.  Eyes:     Conjunctiva/sclera: Conjunctivae normal.      Pupils: Pupils are equal, round, and reactive to light.  Neck:     Comments: No meningismus. Cardiovascular:     Rate and Rhythm: Normal rate and regular rhythm.     Heart sounds: Normal heart sounds. No murmur heard. Pulmonary:     Effort: Pulmonary effort is normal. No respiratory distress.     Breath sounds: Normal breath sounds.  Abdominal:     Palpations: Abdomen is soft.     Tenderness: There is no abdominal tenderness. There is no guarding or rebound.  Musculoskeletal:        General: No tenderness. Normal range of motion.     Cervical back: Normal range of motion and neck supple.  Skin:    General: Skin is warm.  Neurological:     Mental Status: She is alert and oriented to person, place, and time.     Cranial Nerves: No cranial nerve deficit.     Motor: No abnormal muscle tone.     Coordination: Coordination normal.     Comments: CN 2-12 intact, no ataxia on finger to nose, no nystagmus, 5/5 strength throughout,  positive Romberg, unsteady gait. Mild drift left arm. No nystagmus, no ataxia on finger to nose  Psychiatric:        Behavior: Behavior normal.     (all labs ordered are listed, but only abnormal results are displayed) Labs Reviewed  CBG MONITORING, ED - Abnormal; Notable for the following components:      Result Value   Glucose-Capillary 137 (*)    All other components within normal limits  ETHANOL  PROTIME-INR  APTT  CBC  DIFFERENTIAL  COMPREHENSIVE METABOLIC PANEL WITH GFR  URINE DRUG SCREEN  TROPONIN T, HIGH SENSITIVITY    EKG: EKG Interpretation Date/Time:  Tuesday July 29 2024 22:45:44 EDT Ventricular Rate:  75 PR Interval:  148 QRS Duration:  97 QT Interval:  374 QTC Calculation: 418 R Axis:   21  Text Interpretation: Sinus rhythm Ventricular premature complex Low voltage, precordial leads Borderline T abnormalities, anterior leads No significant change was found Confirmed by Carita Senior [phone]) on 07/29/2024 10:59:52  PM  Radiology: No results found.  {Document cardiac monitor, telemetry assessment procedure when appropriate:32947} Procedures   Medications Ordered in the ED - No data to display    {Click here for ABCD2, HEART and other calculators REFRESH Note before signing:1}                              Medical Decision Making Amount and/or Complexity of Data Reviewed Labs: ordered. Decision-making details documented in ED Course. Radiology: ordered and independent interpretation performed. Decision-making details documented in ED Course. ECG/medicine tests: ordered and independent interpretation performed. Decision-making details documented in ED Course.  Risk Prescription drug management.   Patient  with palpitations, near syncope, room spinning dizziness of posterior headache that onset rather suddenly about 8 PM.  Her exam was positive for slight pronator drift of left arm but no nystagmus or ataxia finger-to-nose.  Code stroke activated on arrival.  CT head negative for hemorrhage.  Results reviewed and interpreted by me. CTA negative for large vessel occlusion and negative for aneurysm.  Discussed with Dr. Nicholes of radiology.  {Document critical care time when appropriate  Document review of labs and clinical decision tools ie CHADS2VASC2, etc  Document your independent review of radiology images and any outside records  Document your discussion with family members, caretakers and with consultants  Document social determinants of health affecting pt's care  Document your decision making why or why not admission, treatments were needed:32947:::1}   Final diagnoses:  None    ED Discharge Orders     None

## 2024-07-29 NOTE — ED Notes (Addendum)
 Assuming pt care at this time. Cooper PA at bedside.   LKW 1945 with symptom onset at 2000 tonight. Pt reports sudden onset dizziness, loss of balance, posterior headache, heart palpitations, and generalized tremors. Pt states she was at rest at time of symptom onset. Pt states symptoms have mildly improved since arrival to ER. Pt states she has a hx of Afib, but is not anticoagulated. Negative NIH. BGL 137.

## 2024-07-29 NOTE — ED Notes (Signed)
 Code Stroke called by Dr. Carita

## 2024-07-29 NOTE — ED Notes (Signed)
 Per Wonda PA, pt is not a code stroke at this time. Pending assessment by Dr. Carita.

## 2024-07-29 NOTE — ED Notes (Signed)
 Pt returned from CT and Tele-Neuro physician at bedside performing assessment.

## 2024-07-29 NOTE — ED Triage Notes (Signed)
 Pt states something came over me. Reports heart racing and room started spinning at 2000 Felt she got unsteady gait  into bathroom at home Posterior headache, took tylenol  pta

## 2024-07-29 NOTE — Consult Note (Addendum)
 [facility]   Patient Name:   Kirsten Choi, Kirsten Choi Date of Birth:   March 02, 1956 Identification Number:   MRN - [id number] Date of Service:   07/29/2024 23:18:03  Diagnosis:       R42 - Dizziness/ Vertigo/ Giddiness  Impression:      The patient is a 68 year old woman with a history of hypertension, hyperlipidemia, vertigo, who presents with vertigo. CTA ordered, will follow up results. Prior workup for vertigo negative for stroke. Treat symptomatically with meclizine  25 mg TID and PRN benzodiazepines and antiemetics. If symptoms not improving as expected, consider MRI brain without contrast.  Our recommendations are outlined below.  Recommendations:        Stroke/Telemetry Floor       Neuro Checks       Bedside Swallow Eval       DVT Prophylaxis       IV Fluids, Normal Saline       Head of Bed 30 Degrees       Euglycemia and Avoid Hyperthermia (PRN Acetaminophen )  Sign Out:       Discussed with Emergency Department Provider    ------------------------------------------------------------------------------  Advanced Imaging: Advanced imaging has been ordered. Results pending.   Metrics: Last Known Well: 07/29/2024 20:00:00 Dispatch Time: 07/29/2024 23:18:02 Arrival Time: 07/29/2024 22:32:00 Initial Response Time: 07/29/2024 23:29:39 Symptoms: dizziness. Initial patient interaction: 07/29/2024 23:41:16 NIHSS Assessment Completed: 07/29/2024 23:47:19 Patient is not a candidate for Thrombolytic. Thrombolytic Medical Decision: 07/29/2024 23:47:19 Patient was not deemed candidate for Thrombolytic because of following reasons: Stroke severity too mild (non-disabling) .  CT Head: I personally reviewed all the CT images that were available to me and it showed: no acute abnormality  Primary Provider Notified of Diagnostic Impression and Management Plan on: 07/29/2024  23:55:47    ------------------------------------------------------------------------------  History of Present Illness: Patient is a 68 year old Female.  Patient was brought by private transportation with symptoms of dizziness. Patient was in her normal state of health at 8 PM when she suddenly developed dizziness. Describes room spinning and boat motion sensation. Reports nausea, no vomiting. Denies focal weakness or numbness of extremities, denies change in vision. In ER still feels unsteady. On exam, primary gait is intact and independent. Has history of prior vertigo which was much more severe, resolved with Epley maneuver with ENT. MRI brain at that time in 10/21 negative for infarct.   Past Medical History:      Hypertension      Hyperlipidemia      Migraine Headaches Other PMH:  vertigo  Medications:  No Anticoagulant use  Antiplatelet use: Yes ASA Reviewed EMR for current medications  Allergies:  Reviewed Description: statin  Social History: Smoking: No  Family History:  There is no family history of premature cerebrovascular disease pertinent to this consultation  ROS : 14 Points Review of Systems was performed and was negative except mentioned in HPI.  Past Surgical History: There Is No Surgical History Contributory To Today's Visit    Examination: BP(189/94), Pulse(84), 1A: Level of Consciousness - Alert; keenly responsive + 0 1B: Ask Month and Age - Both Questions Right + 0 1C: Blink Eyes & Squeeze Hands - Performs Both Tasks + 0 2: Test Horizontal Extraocular Movements - Normal + 0 3: Test Visual Fields - No Visual Loss + 0 4: Test Facial Palsy (Use Grimace if Obtunded) - Normal symmetry + 0 5A: Test Left Arm Motor Drift - No Drift for 10 Seconds + 0 5B:  Test Right Arm Motor Drift - No Drift for 10 Seconds + 0 6A: Test Left Leg Motor Drift - No Drift for 5 Seconds + 0 6B: Test Right Leg Motor Drift - No Drift for 5 Seconds + 0 7: Test Limb Ataxia  (FNF/Heel-Shin) - No Ataxia + 0 8: Test Sensation - Normal; No sensory loss + 0 9: Test Language/Aphasia - Normal; No aphasia + 0 10: Test Dysarthria - Normal + 0 11: Test Extinction/Inattention - No abnormality + 0  NIHSS Score: 0   Pre-Morbid Modified Rankin Scale: 0 Points = No symptoms at all  Spoke with : ED MD Dr. Carita I reviewed the available imaging via Rapid and initiated discussion with the primary provider  This consult was conducted in real time using interactive audio and video technology. Patient was informed of the technology being used for this visit and agreed to proceed. Patient located in hospital and provider located at home/office setting.   Patient is being evaluated for possible acute neurologic impairment and high probability of imminent or life-threatening deterioration. I spent total of 30 minutes providing care to this patient, including time for face to face visit via telemedicine, review of medical records, imaging studies and discussion of findings with providers, the patient and/or family.    Dr Delilah Door   [facility] For Inpatient follow-up with [facility] physician please call [facility] at [phone]. As we are not an outpatient service for any post hospital discharge needs please contact the hospital for assistance. If you have any questions for the [facility] physicians or need to reconsult for clinical or diagnostic changes please contact us  via [facility] at [phone].   Signature : Hadden Steig Door [facility] Advanced Imaging:  CTA Head and Neck Completed.  LVO:No

## 2024-07-29 NOTE — ED Notes (Signed)
 Patient transported to CT

## 2024-07-29 NOTE — ED Notes (Signed)
 Dr. Carita at bedside assessing pt

## 2024-07-30 DIAGNOSIS — R42 Dizziness and giddiness: Secondary | ICD-10-CM | POA: Diagnosis not present

## 2024-07-30 LAB — TROPONIN T, HIGH SENSITIVITY: Troponin T High Sensitivity: <15 ng/L (ref 0–19)

## 2024-07-30 MED ORDER — MECLIZINE HCL 25 MG PO TABS: 25.0000 mg | ORAL_TABLET | Freq: Three times a day (TID) | ORAL | 0 refills | Status: DC | PRN

## 2024-07-30 NOTE — ED Notes (Signed)
 Pt ambulated to and from bathroom with steady gait. Denies dizziness, numbness/tingling, extremity weakness. Placed back on monitor. Awaiting dispo.

## 2024-07-30 NOTE — Discharge Instructions (Signed)
 Your testing is reassuring.  As we discussed do not favor stroke at this time but cannot rule out completely without MRI.  If dizziness recurs he may need to have an MRI to assess for stroke.  MRI is only available at [facility]. Take the meclizine  as needed for dizziness but do not take it if you are working or driving as it may make you sleepy.  Return to the ED with new or worsening symptoms.

## 2024-07-30 NOTE — ED Notes (Signed)
Rpt trop sent.

## 2024-08-06 ENCOUNTER — Telehealth: Payer: Self-pay

## 2024-08-06 NOTE — Telephone Encounter (Signed)
 Forwarding patient message below. Labs were collected on on 07/30/2024.

## 2024-08-06 NOTE — Telephone Encounter (Signed)
 Labs were drawn in the emergency department.  Please have patient follow-up to discuss further.

## 2024-08-06 NOTE — Telephone Encounter (Signed)
 Copied from CRM #[id number]. Topic: Clinical - Lab/Test Results >> Aug 05, 2024  2:36 PM Shereese L wrote: Reason for CRM: Patient is requesting that the doctor or nurse calls her in reference to her labs

## 2024-08-06 NOTE — Telephone Encounter (Signed)
 Called patient to schedule a ED follow up for vertigo. Pt stated she will need to call back due to her driving at the time.

## 2024-08-07 NOTE — Telephone Encounter (Signed)
 Noted. OV scheduled for 08/12/2024

## 2024-08-12 ENCOUNTER — Ambulatory Visit (INDEPENDENT_AMBULATORY_CARE_PROVIDER_SITE_OTHER): Admitting: Family Medicine

## 2024-08-12 VITALS — BP 158/72 | Temp 96.1°F | Ht 63.0 in | Wt 236.8 lb

## 2024-08-12 DIAGNOSIS — I1A0 Resistant hypertension: Secondary | ICD-10-CM | POA: Diagnosis not present

## 2024-08-12 DIAGNOSIS — F064 Anxiety disorder due to known physiological condition: Secondary | ICD-10-CM | POA: Diagnosis not present

## 2024-08-12 DIAGNOSIS — H8113 Benign paroxysmal vertigo, bilateral: Secondary | ICD-10-CM

## 2024-08-12 NOTE — Patient Instructions (Addendum)
  VISIT SUMMARY: Today, you were seen for dizziness and vertigo, as well as your ongoing hypertension and obesity. We discussed your symptoms, current medications, and recent lab and imaging results.  YOUR PLAN: VERTIGO: You have been experiencing dizziness and vertigo, which feels like motion sickness. Your symptoms have improved with meclizine . -Continue taking meclizine  as needed. -You will be referred to an ENT specialist for further evaluation and possible Epley maneuver if your symptoms persist. -Seek emergency care for an MRI if your vertigo worsens.  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 -Monitor your blood pressure regularly. -We will reassess your blood pressure in three months or sooner if it remains elevated.  GENERAL HEALTH MAINTENANCE: You received your flu vaccination at [facility]. -We will ensure your flu vaccination is documented in your medical record.

## 2024-08-12 NOTE — Progress Notes (Signed)
 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 Recent imaging and lab work negative for acute findings. Symptoms improved with meclizine . Differential includes vestibular causes. No nystagmus observed during recent episode. - Continue meclizine  as needed. - Refer to ENT for further evaluation and possible Epley maneuver if symptoms persist. - Advise to seek emergency care for MRI if vertigo worsens.  Hypertension Resistant hypertension with current medications including lisinopril , indapamide , amlodipine , and metoprolol . Blood pressure significantly elevated during anxiety but improved upon calming. Orthostatic changes noted, which may contribute to dizziness.  Blood pressure rechecked significantly improved after patient anxiety has decreased. - Monitor blood pressure regularly. - Reassess blood pressure in three months or sooner if it remains elevated.  General Health Maintenance Received flu vaccination at [facility]. - Ensure flu vaccination is documented in medical record.      There are no discontinued medications.  Return in about 3 months (around 11/12/2024) for BP.        Subjective:   Encounter date: 08/12/2024  Kirsten Choi is a 68 y.o. female who has Paroxysmal SVT (supraventricular tachycardia); Hypertension; Bradycardia; OSA (obstructive sleep apnea); Dyspnea; Class 2 severe obesity due to excess calories with serious comorbidity and body mass index (BMI) of 39.0 to 39.9 in adult; Vitamin D  deficiency; Thoracic aortic atherosclerosis; History of Helicobacter pylori infection; Acute anterior lower chest wall pain; Chronic gastric ulcer  without hemorrhage and without perforation; S/P TKR (total knee replacement), left; History of bilateral breast cancer; Metabolic dysfunction-associated steatotic liver disease (MASLD); Cervical radiculopathy due to degenerative joint disease of spine; Antiphospholipid syndrome; Upper respiratory tract infection; PMB (postmenopausal bleeding); Lichen sclerosus et atrophicus; Benign paroxysmal positional vertigo due to bilateral vestibular disorder; and Anxiety disorder due to general medical condition on their problem list..   She  has a past medical history of Arthritis, Cancer (HCC), Hypertension, and Migraine..   She presents with chief complaint of Dizziness (Pt was in hospital for vertigo. Pt states she has been having a few episode. States that night she felt like she was on a ship and rolling. States she also came bc her bloodwork was abnormal and she would like to discuss what it is going on. Pt has got [facility] flu shot [facility] ) .   Discussed the use of AI scribe software for clinical note transcription with the patient, who gave verbal consent to proceed.  History of Present Illness Kirsten Choi is a 68 year old female with morbid obesity and resistant hypertension who presents with dizziness and vertigo.  Vertigo and dizziness - Dizziness and vertigo described as a sensation of being on a ship with the room spinning - Episodes have occurred a few times, including while sitting in a recliner - Previous vertigo episodes typically occurred when rising from bed, but most recent episode occurred while stationary - Meclizine  provided relief during an emergency department visit; advised to take up to three times daily as needed - Not taking meclizine  regularly as dizziness has improved - No new headache, blurry vision, chest pain, or acute shortness of breath  Hypertension and cardiovascular findings - History of resistant hypertension with significantly elevated blood pressure readings -  Current antihypertensive medications: lisinopril  40 mg, amlodipine  5 mg, metoprolol  12.5 mg - Imaging (CT angiography of head and neck) negative for large vessel occlusion or acute findings - Imaging showed micro ischemic vascular disease, tortuosity of major arterial vasculature associated with chronic hypertension, and aortic atherosclerosis - ECG showed sinus rhythm with PVCs, low voltage precordial leads, and borderline T wave abnormalities - Long-term telemetry monitoring performed - Ongoing shortness of breath with walking, previously evaluated by cardiology - Feels anxious, which she believes contributes to elevated blood pressure  Obesity - Morbid obesity  Laboratory and diagnostic findings - Lab work included troponin, CMP, CBC with differential, APTT, prothrombin time, ethanol, and CBG - Concern about abnormal lab results, specifically high red blood count and absolute immature granulocytes; reassured that these findings are not clinically significant  Immunization - Received influenza vaccination at [facility]     ROS  Past Surgical History:  Procedure Laterality Date   BREAST SURGERY     CHOLECYSTECTOMY     COSMETIC SURGERY     HERNIA REPAIR     per pt-did not have   REPLACEMENT TOTAL KNEE     WRIST SURGERY      Outpatient Medications Prior to Visit  Medication Sig Dispense Refill   amLODipine  (NORVASC ) 5 MG tablet Take 1 tablet (5 mg total) by mouth daily. 90 tablet 3   aspirin  EC 81 MG tablet Take 1 tablet (81 mg total) by mouth daily. Swallow whole. 90 tablet 3   clobetasol  ointment (TEMOVATE ) 0.05 % Apply 1 Application topically daily. 90 g 3   ezetimibe  (ZETIA ) 10 MG tablet Take 1 tablet (10 mg total) by mouth daily. 90 tablet 3   fluticasone  (FLONASE ) 50 MCG/ACT nasal spray Place into both nostrils as needed for allergies or rhinitis.     indapamide  (LOZOL ) 1.25 MG tablet TAKE 1 TABLET BY MOUTH DAILY. 90 tablet 0   lisinopril  (ZESTRIL ) 40 MG tablet Take 1 tablet  (40 mg total) by mouth daily. 90 tablet 3   metoprolol  succinate (TOPROL -XL) 25 MG 24 hr tablet Take 0.5 tablets (12.5 mg total) by mouth daily. 45 tablet 3   Multiple Vitamin (MULTIVITAMIN PO) Take by mouth.     omeprazole  (PRILOSEC) 40 MG capsule Take 40 mg by mouth 2 (two) times daily.     psyllium (METAMUCIL) 58.6 % packet Take 1 packet by mouth daily.     meclizine  (ANTIVERT ) 25 MG tablet Take 1 tablet (25 mg total) by mouth 3 (three) times daily as needed for dizziness. (Patient not taking: Reported on 08/12/2024) 20 tablet 0   No facility-administered medications prior to visit.    Family History  Problem Relation Age of Onset   Leukemia Mother    Stroke Father    CAD Father    Cancer Father    Thyroid  cancer Father     Social History   Socioeconomic History   Marital status: Widowed    Spouse name: Not on file   Number of children: Not on file   Years of education: Not on file   Highest education level: Not on file  Occupational History   Not on file  Tobacco Use   Smoking status: Never   Smokeless tobacco: Never  Vaping Use   Vaping status: Never Used  Substance and Sexual Activity   Alcohol use: Never   Drug use: Never   Sexual activity: Not Currently    Partners: Male    Birth control/protection: Post-menopausal  Other Topics Concern  Not on file  Social History Narrative   Not on file   Social Drivers of Health   Financial Resource Strain: Patient Declined (08/15/2022)   Received from [facility]   Overall Financial Resource Strain (CARDIA)    Difficulty of Paying Living Expenses: Patient declined  Food Insecurity: Low Risk  (01/16/2024)   Received from [facility]   Hunger Vital Sign    Within the past 12 months, you worried that your food would run out before you got money to buy more: Never true    Within the past 12 months, the food you bought just didn't last and you didn't have money to get more. : Never true  Transportation Needs: No  Transportation Needs (01/16/2024)   Received from [facility]    In the past 12 months, has lack of reliable transportation kept you from medical appointments, meetings, work or from getting things needed for daily living? : No  Physical Activity: Sufficiently Active (08/15/2022)   Received from [employer], [facility] visits prior to 01/06/2023.   Exercise Vital Sign    On average, how many days per week do you engage in moderate to strenuous exercise (like a brisk walk)?: 6 days    On average, how many minutes do you engage in exercise at this level?: 60 min  Stress: No Stress Concern Present (08/15/2022)   Received from [facility] - Occupational Stress Questionnaire  Social Connections: Unknown (08/15/2022)   Received from [facility] visits prior to 01/06/2023.   Social Connection and Isolation Panel    In a typical week, how many times do you talk on the phone with family, friends, or neighbors?: More than three times a week    How often do you get together with friends or relatives?: More than three times a week    How often do you attend church or religious services?: More than 4 times per year    Do you belong to any clubs or organizations such as church groups, unions, fraternal or athletic groups, or school groups?: Yes    How often do you attend meetings of the clubs or organizations you belong to?: More than 4 times per year    Are you married, widowed, divorced, separated, never married, or living with a partner?: Patient refused  Intimate Partner Violence: Unknown (08/15/2022)   Received from [facility] visits prior to 01/06/2023.   Humiliation, Afraid, Rape, and Kick questionnaire    Within the last year, have you been afraid of your partner or ex-partner?: Patient refused    Within the last year, have you been humiliated or emotionally abused in  other ways by your partner or ex-partner?: Patient refused    Within the last year, have you been kicked, hit, slapped, or otherwise physically hurt by your partner or ex-partner?: Patient refused    Within the last year, have you been raped or forced to have any kind of sexual activity by your partner or ex-partner?: Patient refused  Objective:  Physical Exam: BP (!) 158/72 (BP Location: Left Wrist, Patient Position: Sitting)   Temp (!) 96.1 F (35.6 C) (Temporal)   Ht 5' 3 (1.6 m)   Wt 236 lb 12.8 oz (107.4 kg)   SpO2 97%   BMI 41.95 kg/m    BP Readings from Last 3 Encounters:  08/12/24 (!) 158/72  07/30/24 122/74  06/04/24 128/80    Physical Exam VITALS: BP- 158/72 GENERAL: Alert, cooperative, well developed, no acute distress HEENT: Normocephalic, normal oropharynx, moist mucous membranes CHEST: Clear to auscultation bilaterally, no wheezes, rhonchi, or crackles CARDIOVASCULAR: Normal heart rate and rhythm, S1 and S2 normal without murmurs ABDOMEN: Soft, non-tender, non-distended, without organomegaly, normal bowel sounds EXTREMITIES: No cyanosis or edema NEUROLOGICAL: Cranial nerves grossly intact, moves all extremities without gross motor or sensory deficit  Orthostatic VS for the past 72 hrs (Last 3 readings):  Orthostatic BP Patient Position BP Location Orthostatic Pulse  08/12/24 1022 -- Sitting Left Wrist --  08/12/24 1004 (!) 202/114 -- -- 82  08/12/24 1002 (!) 202/117 -- -- 79  08/12/24 0951 (!) 178/98 -- -- 70    Physical Exam  CT HEAD CODE STROKE WO CONTRAST Result Date: 07/29/2024 CLINICAL DATA:  Initial evaluation for acute neuro deficit, stroke suspected. EXAM: CT HEAD WITHOUT CONTRAST CT ANGIOGRAPHY HEAD AND NECK TECHNIQUE: Multidetector CT imaging of the head and neck was performed using the standard protocol during bolus administration of intravenous  contrast. Multiplanar CT image reconstructions and MIPs were obtained to evaluate the vascular anatomy. Carotid stenosis measurements (when applicable) are obtained utilizing NASCET criteria, using the distal internal carotid diameter as the denominator. RADIATION DOSE REDUCTION: This exam was performed according to the departmental dose-optimization program which includes automated exposure control, adjustment of the mA and/or kV according to patient size and/or use of iterative reconstruction technique. CONTRAST:  75mL OMNIPAQUE  IOHEXOL  350 MG/ML SOLN COMPARISON:  None Available. FINDINGS: CT HEAD FINDINGS Brain: Cerebral volume within normal limits. Mild chronic microvascular ischemic disease for age. No acute intracranial hemorrhage. No acute large vessel territory infarct. No mass lesion or midline shift. No hydrocephalus or extra-axial fluid collection. Vascular: No abnormal hyperdense vessel. Skull: Scalp soft tissues demonstrate no acute finding. Calvarium intact. Sinuses/Orbits: Globes and orbital soft tissues within normal limits. Paranasal sinuses are largely clear. No mastoid effusion. Other: None. ASPECTS (Alberta Stroke Program Early CT Score) - Ganglionic level infarction (caudate, lentiform nuclei, internal capsule, insula, M1-M3 cortex): 7 - Supraganglionic infarction (M4-M6 cortex): 3 Total score (0-10 with 10 being normal): 10 CTA NECK FINDINGS Aortic arch: Visualized arch within normal limits for caliber with standard 3 vessel morphology. Mild aortic atherosclerosis. No significant stenosis about the origin the great vessels. Right carotid system: Right common and internal carotid arteries are tortuous but patent without dissection. Mild atheromatous change about the right carotid bulb without hemodynamically significant greater than 50% stenosis. Left carotid system: Left common and internal carotid arteries are tortuous but patent without dissection. Mild atheromatous change about the left  carotid bulb without hemodynamically significant greater than 50% stenosis. Vertebral arteries: Both vertebral arteries arise from subclavian arteries. No proximal subclavian artery stenosis. Vertebral arteries are mildly tortuous but patent without stenosis or dissection. Skeleton: No discrete or worrisome osseous lesions. Mild spondylosis at C5-6 and C6-7. Other neck: No other acute finding. Upper chest: No other acute finding. Review of the MIP images confirms the above findings CTA HEAD FINDINGS Anterior circulation: Both internal carotid arteries are patent to the termini without stenosis  or other abnormality. A1 segments patent bilaterally. Normal anterior communicating artery complex. Anterior cerebral arteries patent without significant stenosis. No M1 stenosis or occlusion. No proximal MCA branch occlusion. Distal MCA branches perfused and symmetric. Posterior circulation: Both V4 segments patent without stenosis. Right PICA patent. Left PICA origin not well seen. Basilar patent without stenosis. Superior cerebellar and posterior cerebral arteries patent bilaterally. Venous sinuses: Patent allowing for timing the contrast bolus. Anatomic variants: None significant.  No aneurysm. Review of the MIP images confirms the above findings IMPRESSION: CT HEAD: 1. No acute intracranial abnormality. 2. Aspects = 10. 3. Mild chronic microvascular ischemic disease for age. CTA HEAD AND NECK: 1. Negative CTA for large vessel occlusion or other emergent finding. 2. Mild for age atheromatous change about the carotid bifurcations without hemodynamically significant stenosis. 3. Diffuse tortuosity of the major arterial vasculature of the head and neck, suggesting chronic underlying hypertension. 4.  Aortic Atherosclerosis (ICD10-I70.0). Results were called by telephone at the time of interpretation on 07/29/2024 at 11:45 p.m. to provider [facility]. Electronically Signed   By: Morene Hoard M.D.   On: 07/29/2024  23:55   CT ANGIO HEAD NECK W WO CM (CODE STROKE) Result Date: 07/29/2024 CLINICAL DATA:  Initial evaluation for acute neuro deficit, stroke suspected. EXAM: CT HEAD WITHOUT CONTRAST CT ANGIOGRAPHY HEAD AND NECK TECHNIQUE: Multidetector CT imaging of the head and neck was performed using the standard protocol during bolus administration of intravenous contrast. Multiplanar CT image reconstructions and MIPs were obtained to evaluate the vascular anatomy. Carotid stenosis measurements (when applicable) are obtained utilizing NASCET criteria, using the distal internal carotid diameter as the denominator. RADIATION DOSE REDUCTION: This exam was performed according to the departmental dose-optimization program which includes automated exposure control, adjustment of the mA and/or kV according to patient size and/or use of iterative reconstruction technique. CONTRAST:  75mL OMNIPAQUE  IOHEXOL  350 MG/ML SOLN COMPARISON:  None Available. FINDINGS: CT HEAD FINDINGS Brain: Cerebral volume within normal limits. Mild chronic microvascular ischemic disease for age. No acute intracranial hemorrhage. No acute large vessel territory infarct. No mass lesion or midline shift. No hydrocephalus or extra-axial fluid collection. Vascular: No abnormal hyperdense vessel. Skull: Scalp soft tissues demonstrate no acute finding. Calvarium intact. Sinuses/Orbits: Globes and orbital soft tissues within normal limits. Paranasal sinuses are largely clear. No mastoid effusion. Other: None. ASPECTS (Alberta Stroke Program Early CT Score) - Ganglionic level infarction (caudate, lentiform nuclei, internal capsule, insula, M1-M3 cortex): 7 - Supraganglionic infarction (M4-M6 cortex): 3 Total score (0-10 with 10 being normal): 10 CTA NECK FINDINGS Aortic arch: Visualized arch within normal limits for caliber with standard 3 vessel morphology. Mild aortic atherosclerosis. No significant stenosis about the origin the great vessels. Right carotid system:  Right common and internal carotid arteries are tortuous but patent without dissection. Mild atheromatous change about the right carotid bulb without hemodynamically significant greater than 50% stenosis. Left carotid system: Left common and internal carotid arteries are tortuous but patent without dissection. Mild atheromatous change about the left carotid bulb without hemodynamically significant greater than 50% stenosis. Vertebral arteries: Both vertebral arteries arise from subclavian arteries. No proximal subclavian artery stenosis. Vertebral arteries are mildly tortuous but patent without stenosis or dissection. Skeleton: No discrete or worrisome osseous lesions. Mild spondylosis at C5-6 and C6-7. Other neck: No other acute finding. Upper chest: No other acute finding. Review of the MIP images confirms the above findings CTA HEAD FINDINGS Anterior circulation: Both internal carotid arteries are patent to the termini without stenosis  or other abnormality. A1 segments patent bilaterally. Normal anterior communicating artery complex. Anterior cerebral arteries patent without significant stenosis. No M1 stenosis or occlusion. No proximal MCA branch occlusion. Distal MCA branches perfused and symmetric. Posterior circulation: Both V4 segments patent without stenosis. Right PICA patent. Left PICA origin not well seen. Basilar patent without stenosis. Superior cerebellar and posterior cerebral arteries patent bilaterally. Venous sinuses: Patent allowing for timing the contrast bolus. Anatomic variants: None significant.  No aneurysm. Review of the MIP images confirms the above findings IMPRESSION: CT HEAD: 1. No acute intracranial abnormality. 2. Aspects = 10. 3. Mild chronic microvascular ischemic disease for age. CTA HEAD AND NECK: 1. Negative CTA for large vessel occlusion or other emergent finding. 2. Mild for age atheromatous change about the carotid bifurcations without hemodynamically significant stenosis. 3.  Diffuse tortuosity of the major arterial vasculature of the head and neck, suggesting chronic underlying hypertension. 4.  Aortic Atherosclerosis (ICD10-I70.0). Results were called by telephone at the time of interpretation on 07/29/2024 at 11:45 p.m. to provider [facility]. Electronically Signed   By: Morene Hoard M.D.   On: 07/29/2024 23:55   MM 3D SCREENING MAMMOGRAM BILATERAL BREAST Result Date: 06/17/2024 CLINICAL DATA:  Screening. EXAM: DIGITAL SCREENING BILATERAL MAMMOGRAM WITH TOMOSYNTHESIS AND CAD TECHNIQUE: Bilateral screening digital craniocaudal and mediolateral oblique mammograms were obtained. Bilateral screening digital breast tomosynthesis was performed. The images were evaluated with computer-aided detection. COMPARISON:  Previous exam(s). ACR Breast Density Category a: The breasts are almost entirely fatty. FINDINGS: There are no findings suspicious for malignancy. IMPRESSION: No mammographic evidence of malignancy. A result letter of this screening mammogram will be mailed directly to the patient. RECOMMENDATION: Screening mammogram in one year. (Code:[id number]) BI-RADS CATEGORY  1: Negative. Electronically Signed   By: Alm Parkins M.D.   On: 06/17/2024 14:45   US  Transvaginal Non-OB Result Date: 06/11/2024 Indications: Postmenopausal bleeding Findings: Uterus: 4.7 x 3.0 x 1.8 cm, anteverted uterus. Endometrial thickness: 2 mm. Left ovary: 1.9 x 1.1 x 0.8 cm, normal-appearing. Right ovary: 1.6 x 1.0 x 1.0 cm, normal-appearing. No free fluid. Impression: Normal transvaginal ultrasound. Vera LULLA Pa, MD  DG Bone Density Result Date: 06/02/2024 EXAM: DUAL X-RAY ABSORPTIOMETRY (DXA) FOR BONE MINERAL DENSITY 06/02/2024 2:28 pm CLINICAL DATA:  68 year old Female Postmenopausal. Screening for osteoporosis History of fragility fracture. Patient is or has been on bone building therapies. TECHNIQUE: An axial (e.g., hips, spine) and/or appendicular (e.g., radius) exam was performed, as  appropriate, using [employer] Secretary/administrator at [employer]. Images are obtained for bone mineral density measurement and are not obtained for diagnostic purposes. [id number] Exclusions: L4. COMPARISON:  None. New baseline. FINDINGS: Scan quality: Good. LUMBAR SPINE (L1-L3): BMD (in g/cm2): 0.992 T-score: -1.5 Z-score: 0.2 LEFT FEMORAL NECK: BMD (in g/cm2): 0.855 T-score: -1.3 Z-score: 0.3 LEFT TOTAL HIP: BMD (in g/cm2): 0.837 T-score: -1.4 Z-score: 0.1 RIGHT FEMORAL NECK: BMD (in g/cm2): 0.850 T-score: -1.3 Z-score: 0.3 RIGHT TOTAL HIP: BMD (in g/cm2): 0.840 T-score: -1.3 Z-score: 0.1 FRAX 10-YEAR PROBABILITY OF FRACTURE: 10-year fracture risk is performed using the [employer] FRAX calculator based on patient-reported risk factors. Major osteoporotic fracture: 8.3% Hip fracture: 0.9% Other situations known to alter the reliability of the FRAX score should be considered when making treatment decisions, including chronic glucocorticoid use and past treatments. Further guidance on treatment can be found at the [facility]'s website [email or web address]://www.[email or web address].com/. IMPRESSION: Osteopenia based on BMD. Fracture risk is unknown due to history of  bone building therapy. RECOMMENDATIONS: 1. All patients should optimize calcium and vitamin D  intake. 2. Consider FDA-approved medical therapies in postmenopausal women and men aged 55 years and older, based on the following: - A hip or vertebral (clinical or morphometric) fracture - T-score less than or equal to -2.5 and secondary causes have been excluded. - Low bone mass (T-score between -1.0 and -2.5) and a 10-year probability of a hip fracture greater than or equal to 3% or a 10-year probability of a major osteoporosis-related fracture greater than or equal to 20% based on the [place] -adapted WHO algorithm. - Clinician judgment and/or patient preferences may indicate treatment for people with 10-year fracture probabilities above or  below these levels 3. Patients with diagnosis of osteoporosis or at high risk for fracture should have regular bone mineral density tests. For patients eligible for Medicare, routine testing is allowed once every 2 years. The testing frequency can be increased to one year for patients who have rapidly progressing disease, those who are receiving or discontinuing medical therapy to restore bone mass, or have additional risk factors. Electronically Signed   By: Dina  Arceo M.D.   On: 06/02/2024 20:05    Recent Results (from the past 2160 hours)  Basic Metabolic Panel (BMET)     Status: None   Collection Time: 05/20/24  8:40 AM  Result Value Ref Range   Sodium 135 135 - 145 mEq/L   Potassium 4.3 3.5 - 5.1 mEq/L   Chloride 99 96 - 112 mEq/L   CO2 27 19 - 32 mEq/L   Glucose, Bld 98 70 - 99 mg/dL   BUN 17 6 - 23 mg/dL   Creatinine, Ser 9.34 0.40 - 1.20 mg/dL   GFR 09.88 >39.99 mL/min    Comment: Calculated using the CKD-EPI Creatinine Equation (2021)   Calcium 9.2 8.4 - 10.5 mg/dL  Cytology - PAP     Status: None   Collection Time: 06/04/24  8:42 AM  Result Value Ref Range   High risk HPV Negative    Adequacy      Satisfactory for evaluation. The presence or absence of an   Adequacy      endocervical/transformation zone component cannot be determined because   Adequacy of atrophy.    Diagnosis      - Negative for intraepithelial lesion or malignancy (NILM)   Comment Normal Reference Range HPV - Negative   CBG monitoring, ED     Status: Abnormal   Collection Time: 07/29/24 10:47 PM  Result Value Ref Range   Glucose-Capillary 137 (H) 70 - 99 mg/dL    Comment: Glucose reference range applies only to samples taken after fasting for at least 8 hours.  Ethanol     Status: None   Collection Time: 07/29/24 11:12 PM  Result Value Ref Range   Alcohol, Ethyl (B) <15 <15 mg/dL    Comment: (NOTE) For medical purposes only. Performed at [facility], [address] [place],  [place] [address]   Protime-INR     Status: None   Collection Time: 07/29/24 11:12 PM  Result Value Ref Range   Prothrombin Time 13.2 11.4 - 15.2 seconds   INR 0.9 0.8 - 1.2    Comment: (NOTE) INR goal varies based on device and disease states. Performed at [facility], [address] [place], [place] [address]   APTT     Status: None   Collection Time: 07/29/24 11:12 PM  Result Value Ref Range  aPTT 35 24 - 36 seconds    Comment: Performed at [facility], [address] [place], [place] [address]  CBC     Status: Abnormal   Collection Time: 07/29/24 11:12 PM  Result Value Ref Range   WBC 9.3 4.0 - 10.5 K/uL   RBC 5.22 (H) 3.87 - 5.11 MIL/uL   Hemoglobin 14.7 12.0 - 15.0 g/dL   HCT 55.9 63.9 - 53.9 %   MCV 84.3 80.0 - 100.0 fL   MCH 28.2 26.0 - 34.0 pg   MCHC 33.4 30.0 - 36.0 g/dL   RDW 87.2 88.4 - 84.4 %   Platelets 303 150 - 400 K/uL   nRBC 0.0 0.0 - 0.2 %    Comment: Performed at [facility], [address] [place], [place] [address]  Differential     Status: Abnormal   Collection Time: 07/29/24 11:12 PM  Result Value Ref Range   Neutrophils Relative % 71 %   Neutro Abs 6.6 1.7 - 7.7 K/uL   Lymphocytes Relative 16 %   Lymphs Abs 1.5 0.7 - 4.0 K/uL   Monocytes Relative 10 %   Monocytes Absolute 0.9 0.1 - 1.0 K/uL   Eosinophils Relative 1 %   Eosinophils Absolute 0.1 0.0 - 0.5 K/uL   Basophils Relative 1 %   Basophils Absolute 0.1 0.0 - 0.1 K/uL   Immature Granulocytes 1 %   Abs Immature Granulocytes 0.10 (H) 0.00 - 0.07 K/uL    Comment: Performed at [facility], [address] [place], [place] [address]  Comprehensive metabolic panel     Status: Abnormal   Collection Time: 07/29/24 11:12 PM  Result Value Ref Range   Sodium 139 135 - 145 mmol/L   Potassium 4.2 3.5 - 5.1 mmol/L   Chloride 102 98 - 111 mmol/L   CO2 23 22 - 32 mmol/L   Glucose, Bld 124 (H) 70 - 99 mg/dL    Comment: Glucose reference range applies only to  samples taken after fasting for at least 8 hours.   BUN 13 8 - 23 mg/dL   Creatinine, Ser 9.29 0.44 - 1.00 mg/dL   Calcium 9.7 8.9 - 89.6 mg/dL   Total Protein 7.2 6.5 - 8.1 g/dL   Albumin 4.9 3.5 - 5.0 g/dL   AST 26 15 - 41 U/L   ALT 37 0 - 44 U/L   Alkaline Phosphatase 108 38 - 126 U/L   Total Bilirubin 0.4 0.0 - 1.2 mg/dL   GFR, Estimated >39 >39 mL/min    Comment: (NOTE) Calculated using the CKD-EPI Creatinine Equation (2021)    Anion gap 14 5 - 15    Comment: Performed at [facility], [address] [place], [place] [address]  Troponin T, High Sensitivity     Status: None   Collection Time: 07/29/24 11:12 PM  Result Value Ref Range   Troponin T High Sensitivity <15 0 - 19 ng/L    Comment: (NOTE) Biotin concentrations > 1000 ng/mL falsely decrease TnT results.  Serial cardiac troponin measurements are suggested.  Refer to the Links section for chest pain algorithms and additional  guidance. Performed at [facility], [address] [place], [place] [address]   Troponin T, High Sensitivity     Status: None   Collection Time: 07/30/24  1:53 AM  Result Value Ref Range   Troponin T High Sensitivity <15 0 - 19 ng/L  Comment: (NOTE) Biotin concentrations > 1000 ng/mL falsely decrease TnT results.  Serial cardiac troponin measurements are suggested.  Refer to the Links section for chest pain algorithms and additional  guidance. Performed at [facility], [address] [place], [place] [address]         Beverley Adine Hummer, MD, MS

## 2024-08-26 ENCOUNTER — Telehealth: Payer: Self-pay

## 2024-08-26 NOTE — Telephone Encounter (Signed)
 Copied from CRM #[id number]. Topic: Clinical - Order For Equipment >> Aug 25, 2024  1:33 PM Rozanna MATSU wrote: Reason for CRM: Pt stated she is changing her supplier for her CPAP machine to [employer] and they are needing her chart notes, her sleep study results, and the prescription for the order.    Patient has not been seen since 11/ 2024 was to follow up in 6 months is over due, please schedule appointment  with sleep Md

## 2024-08-27 ENCOUNTER — Emergency Department (HOSPITAL_COMMUNITY)

## 2024-08-27 ENCOUNTER — Emergency Department (HOSPITAL_COMMUNITY)
Admission: EM | Admit: 2024-08-27 | Discharge: 2024-08-27 | Disposition: A | Discharge status: Home / Self Care | Attending: Emergency Medicine | Admitting: Emergency Medicine

## 2024-08-27 ENCOUNTER — Encounter (HOSPITAL_COMMUNITY): Payer: Self-pay

## 2024-08-27 DIAGNOSIS — Z7982 Long term (current) use of aspirin: Secondary | ICD-10-CM | POA: Insufficient documentation

## 2024-08-27 DIAGNOSIS — H539 Unspecified visual disturbance: Secondary | ICD-10-CM | POA: Diagnosis not present

## 2024-08-27 DIAGNOSIS — Z79899 Other long term (current) drug therapy: Secondary | ICD-10-CM | POA: Diagnosis not present

## 2024-08-27 DIAGNOSIS — G43109 Migraine with aura, not intractable, without status migrainosus: Secondary | ICD-10-CM | POA: Diagnosis not present

## 2024-08-27 DIAGNOSIS — I10 Essential (primary) hypertension: Secondary | ICD-10-CM | POA: Insufficient documentation

## 2024-08-27 DIAGNOSIS — R519 Headache, unspecified: Secondary | ICD-10-CM

## 2024-08-27 LAB — COMPREHENSIVE METABOLIC PANEL WITH GFR
ALT: 37 U/L (ref 0–44)
AST: 32 U/L (ref 15–41)
Albumin: 4.2 g/dL (ref 3.5–5.0)
Alkaline Phosphatase: 89 U/L (ref 38–126)
Anion gap: 10 (ref 5–15)
BUN: 18 mg/dL (ref 8–23)
CO2: 21 mmol/L — ABNORMAL LOW (ref 22–32)
Calcium: 9 mg/dL (ref 8.9–10.3)
Chloride: 104 mmol/L (ref 98–111)
Creatinine, Ser: 0.76 mg/dL (ref 0.44–1.00)
GFR, Estimated: >60 mL/min (ref >60)
Glucose, Bld: 98 mg/dL (ref 70–99)
Potassium: 3.8 mmol/L (ref 3.5–5.1)
Sodium: 135 mmol/L (ref 135–145)
Total Bilirubin: 0.8 mg/dL (ref 0.0–1.2)
Total Protein: 7.2 g/dL (ref 6.5–8.1)

## 2024-08-27 LAB — CBG MONITORING, ED: Glucose-Capillary: 104 mg/dL — ABNORMAL HIGH (ref 70–99)

## 2024-08-27 LAB — DIFFERENTIAL
Abs Immature Granulocytes: 0.07 K/uL (ref 0.00–0.07)
Basophils Absolute: 0.1 K/uL (ref 0.0–0.1)
Basophils Relative: 1 %
Eosinophils Absolute: 0.1 K/uL (ref 0.0–0.5)
Eosinophils Relative: 1 %
Immature Granulocytes: 1 %
Lymphocytes Relative: 16 %
Lymphs Abs: 1.4 K/uL (ref 0.7–4.0)
Monocytes Absolute: 0.8 K/uL (ref 0.1–1.0)
Monocytes Relative: 9 %
Neutro Abs: 6.4 K/uL (ref 1.7–7.7)
Neutrophils Relative %: 72 %

## 2024-08-27 LAB — PROTIME-INR
INR: 0.9 (ref 0.8–1.2)
Prothrombin Time: 13.2 s (ref 11.4–15.2)

## 2024-08-27 LAB — C-REACTIVE PROTEIN: CRP: 0.8 mg/dL (ref <1.0)

## 2024-08-27 LAB — CBC
HCT: 45.3 % (ref 36.0–46.0)
Hemoglobin: 14.8 g/dL (ref 12.0–15.0)
MCH: 28 pg (ref 26.0–34.0)
MCHC: 32.7 g/dL (ref 30.0–36.0)
MCV: 85.8 fL (ref 80.0–100.0)
Platelets: 288 K/uL (ref 150–400)
RBC: 5.28 MIL/uL — ABNORMAL HIGH (ref 3.87–5.11)
RDW: 13 % (ref 11.5–15.5)
WBC: 8.9 K/uL (ref 4.0–10.5)
nRBC: 0 % (ref 0.0–0.2)

## 2024-08-27 LAB — RAPID URINE DRUG SCREEN, HOSP PERFORMED
Amphetamines: NOT DETECTED
Barbiturates: NOT DETECTED
Benzodiazepines: NOT DETECTED
Cocaine: NOT DETECTED
Opiates: NOT DETECTED
Tetrahydrocannabinol: NOT DETECTED

## 2024-08-27 LAB — SEDIMENTATION RATE: Sed Rate: 7 mm/h (ref 0–22)

## 2024-08-27 LAB — APTT: aPTT: 31 s (ref 24–36)

## 2024-08-27 LAB — ETHANOL: Alcohol, Ethyl (B): <15 mg/dL (ref <15)

## 2024-08-27 NOTE — ED Notes (Signed)
 Unable to obtain all tubes, sent down LG [employer] Lav baby tubes

## 2024-08-27 NOTE — Discharge Instructions (Addendum)
 After discussing your case with our ophthalmology physician and our neurologist the best explanation for your headache and blurred vision that we were able to come up with is that you have an ocular migraine. Please follow-up with neurology and with ophthalmology.  Please return to the emergency room for any new or concerning symptoms.  Specifically if you develop any changes in your speech, difficulty walking, numbness or weakness in your extremities or facial droop. Continue taking the 1 aspirin  daily

## 2024-08-27 NOTE — ED Provider Notes (Signed)
 Accepted handoff at shift change from A Z PA-C. Please see prior provider note for more detail.   Briefly: Patient is 68 y.o.   Patient is a 68 year old female present emergency room today with no symptoms currently but did have wavy vision in the left and?  Both eyes earlier today around 10 AM this lasted for approximately 30 to 40 minutes and then resolved.  She did have a headache that was occipital and began after visiual symptoms.     DDX: concern for stroke, ocular migraine, complex migraine  Plan: MRI brain      Physical Exam  BP 129/63 (BP Location: Right Arm)   Pulse 61   Temp 98.1 F (36.7 C)   Resp 18   SpO2 100%   Physical Exam  Procedures  Procedures  ED Course / MDM   Clinical Course as of 08/27/24 1723  Wed Aug 27, 2024  1619 MRI brain and Ophtho  [WF]    Clinical Course User Index [WF] Neldon Hamp RAMAN, [place]   Medical Decision Making Amount and/or Complexity of Data Reviewed Labs: ordered. Radiology: ordered.   Discussed with neurology Sal recommend MRI brain if no stroke recommend outpatient follow-up with ophthalmology.  Ophthalmology Dr. Fleeta suspects ocular migraine based on symptoms we will see in follow-up.  She was discharged home symptom-free at this time.       Neldon Hamp [place], [place] 08/27/24 2136    Freddi Hamilton, MD 08/27/24 [phone]

## 2024-08-27 NOTE — ED Triage Notes (Signed)
 Pt states she had wavy parts in her vision. Was looking at a picture and it looked like there was wavy lines running through her eyesight. Tried switching out her glasses and it didn't help. Also had a headache at the back of her neck when it started. Headache is going away, not seeing wavy lines at this time but still has blurry vision.

## 2024-08-27 NOTE — ED Provider Triage Note (Signed)
 Emergency Medicine Provider Triage Evaluation Note  Kirsten Choi , a 68 y.o. female  was evaluated in triage.  Pt complains of intermittent blurry vision left.  Review of Systems  Positive: Blurry vision left now resolved Negative: Focal weakness  Physical Exam  BP (!) 195/95   Pulse 90   Temp 97.9 F (36.6 C) (Oral)   Resp 20   SpO2 98%  Gen:   Awake, no distress adult female speaking clearly Resp:  Normal effort no increased work of breathing MSK:   Moves extremities without difficulty no deformity Other:  Neuro grossly intact  Medical Decision Making  Medically screening exam initiated at 11:44 AM.  Appropriate orders placed.  Kirsten Choi was informed that the remainder of the evaluation will be completed by another provider, this initial triage assessment does not replace that evaluation, and the importance of remaining in the ED until their evaluation is complete.   Kirsten Charleston, MD 08/27/24 [phone]

## 2024-08-27 NOTE — ED Triage Notes (Signed)
 Pt was seen at different ER a few weeks ago for dizziness and balance issues, was told if she had any blurred vision or headaches to go to [facility] ER. Pt states around 1015 she noticed blurred vision in her eyes, cannot remember which one, but could not see out of her periphery. Pt says it has since gotten a little better. Pt also has headache. No visual field deficit noted from quick exam.

## 2024-08-27 NOTE — ED Provider Notes (Cosign Needed Addendum)
 [place] EMERGENCY DEPARTMENT AT [facility] Provider Note   CSN: [id number] Arrival date & time: 08/27/24  1107     Patient presents with: Blurred Vision   Kirsten Choi is a 68 y.o. female.  Patient presents to the emergency department with concerns of left eye blurry vision.  Reports that she experienced wavy feeling in her vision as if there were wavy lines running through her eyesight that did not respond to changing eyeglasses and did endorse a development of a headache approximately 30 minutes after her vision change.  She reports the headache is gone away and that the blurry vision is also gone away.  She was recently evaluated by her ophthalmologist about 1 month ago without any concerning findings at that time.  She has a past history significant for hypertension, obesity, thoracic aortic atherosclerosis, antiphospholipid syndrome.  She states that she is here primarily to assess for a stroke.  At no time that she endorsed or if you have any abnormal sensation or functioning of any upper or lower extremity.  HPI     Prior to Admission medications   Medication Sig Start Date End Date Taking? Authorizing Provider  amLODipine  (NORVASC ) 5 MG tablet Take 1 tablet (5 mg total) by mouth daily. 03/13/24 03/08/25  Sebastian Beverley NOVAK, MD  aspirin  EC 81 MG tablet Take 1 tablet (81 mg total) by mouth daily. Swallow whole. 03/13/24 03/08/25  Sebastian Beverley NOVAK, MD  clobetasol  ointment (TEMOVATE ) 0.05 % Apply 1 Application topically daily. 06/04/24 09/02/24  Dallie Vera GAILS, MD  ezetimibe  (ZETIA ) 10 MG tablet Take 1 tablet (10 mg total) by mouth daily. 03/13/24 03/08/25  Sebastian Beverley NOVAK, MD  fluticasone  (FLONASE ) 50 MCG/ACT nasal spray Place into both nostrils as needed for allergies or rhinitis.    [provider]  indapamide  (LOZOL ) 1.25 MG tablet TAKE 1 TABLET BY MOUTH DAILY. 07/14/24   Sebastian Beverley NOVAK, MD  lisinopril  (ZESTRIL ) 40 MG tablet Take 1 tablet (40 mg total) by mouth daily.  03/13/24 03/08/25  Sebastian Beverley NOVAK, MD  meclizine  (ANTIVERT ) 25 MG tablet Take 1 tablet (25 mg total) by mouth 3 (three) times daily as needed for dizziness. Patient not taking: Reported on 08/12/2024 07/30/24   Carita Senior, MD  metoprolol  succinate (TOPROL -XL) 25 MG 24 hr tablet Take 0.5 tablets (12.5 mg total) by mouth daily. 03/13/24 03/13/25  Sebastian Beverley NOVAK, MD  Multiple Vitamin (MULTIVITAMIN PO) Take by mouth.    [provider]  omeprazole  (PRILOSEC) 40 MG capsule Take 40 mg by mouth 2 (two) times daily. 08/02/24   [provider]  psyllium (METAMUCIL) 58.6 % packet Take 1 packet by mouth daily.    [provider]    Allergies: Nickel, Nsaids, Sertraline, Statins, and Tape    Review of Systems  Eyes:  Positive for visual disturbance.  All other systems reviewed and are negative.   Updated Vital Signs BP 134/78   Pulse 73   Temp 98.1 F (36.7 C)   Resp 15   SpO2 100%   Physical Exam Vitals and nursing note reviewed.  Constitutional:      General: She is not in acute distress.    Appearance: She is well-developed.  HENT:     Head: Normocephalic and atraumatic.  Eyes:     General: No scleral icterus.       Right eye: No foreign body, discharge or hordeolum.        Left eye: No foreign body, discharge or  hordeolum.     Extraocular Movements: Extraocular movements intact.     Right eye: Normal extraocular motion and no nystagmus.     Left eye: Normal extraocular motion and no nystagmus.     Conjunctiva/sclera: Conjunctivae normal.     Right eye: Right conjunctiva is not injected. No chemosis, exudate or hemorrhage.    Left eye: Left conjunctiva is not injected. No chemosis, exudate or hemorrhage.    Pupils: Pupils are equal, round, and reactive to light.     Comments: Funduscopic exam reveals normal-appearing retina with no signs of discoloration or torturous veins or papilledema.  Bedside ocular ultrasound shows no evidence to suggest vitreous  or retinal detachment.  Cardiovascular:     Rate and Rhythm: Normal rate and regular rhythm.     Heart sounds: No murmur heard. Pulmonary:     Effort: Pulmonary effort is normal. No respiratory distress.     Breath sounds: Normal breath sounds.  Abdominal:     Palpations: Abdomen is soft.     Tenderness: There is no abdominal tenderness.  Musculoskeletal:        General: No swelling.     Cervical back: Neck supple.  Skin:    General: Skin is warm and dry.     Capillary Refill: Capillary refill takes less than 2 seconds.  Neurological:     Mental Status: She is alert.  Psychiatric:        Mood and Affect: Mood normal.     (all labs ordered are listed, but only abnormal results are displayed) Labs Reviewed  CBC - Abnormal; Notable for the following components:      Result Value   RBC 5.28 (*)    All other components within normal limits  COMPREHENSIVE METABOLIC PANEL WITH GFR - Abnormal; Notable for the following components:   CO2 21 (*)    All other components within normal limits  CBG MONITORING, ED - Abnormal; Notable for the following components:   Glucose-Capillary 104 (*)    All other components within normal limits  DIFFERENTIAL  ETHANOL  RAPID URINE DRUG SCREEN, HOSP PERFORMED  PROTIME-INR  APTT  SEDIMENTATION RATE  C-REACTIVE PROTEIN    EKG: None  Radiology: MR BRAIN WO CONTRAST Result Date: 08/27/2024 EXAM: MRI BRAIN WITHOUT CONTRAST 08/27/2024 06:48:05 PM TECHNIQUE: Multiplanar multisequence MRI of the head/brain was performed without the administration of intravenous contrast. COMPARISON: 09/04/2020 CLINICAL HISTORY: FINDINGS: BRAIN AND VENTRICLES: No acute infarct. No intracranial hemorrhage. No mass. No midline shift. No hydrocephalus. Multifocal hyperintense T2-weighted signal within the cerebral white matter, most commonly due to chronic small vessel disease. The sella is unremarkable. Normal flow voids. ORBITS: No acute abnormality. SINUSES AND  MASTOIDS: No acute abnormality. BONES AND SOFT TISSUES: Normal marrow signal. No acute soft tissue abnormality. IMPRESSION: 1. No acute intracranial abnormality. 2. Multifocal hyperintense T2-weighted signal within the cerebral white matter, most commonly due to chronic small vessel disease. Electronically signed by: Franky Stanford MD 08/27/2024 07:28 PM EDT RP Workstation: [id number]   CT HEAD WO CONTRAST Result Date: 08/27/2024 EXAM: CT HEAD WITHOUT CONTRAST 08/27/2024 12:03:55 PM TECHNIQUE: CT of the head was performed without the administration of intravenous contrast. Automated exposure control, iterative reconstruction, and/or weight based adjustment of the mA/kV was utilized to reduce the radiation dose to as low as reasonably achievable. COMPARISON: MRI 09/04/2020, Head CT 05/10/2021, Head CT 07/29/2022. CLINICAL HISTORY: Neuro deficit, acute, stroke suspected. Pt complains of intermittent blurry vision left. FINDINGS: BRAIN AND VENTRICLES: No acute hemorrhage. No  evidence of acute infarct. No hydrocephalus. No extra-axial collection. No mass effect or midline shift. Brain volume remains normal for age. Stable gray white differentiation. Minimal for age nonspecific white matter hypodensity. No suspicious intracranial vascular hyperdensity. ORBITS: No acute abnormality. SINUSES: No acute abnormality. SOFT TISSUES AND SKULL: No acute soft tissue abnormality. No skull fracture. IMPRESSION: 1. No acute intracranial abnormality. 2. Stable and negative for age non-contrast CT appearance of the brain. Electronically signed by: Helayne Hurst MD 08/27/2024 12:15 PM EDT RP Workstation: [id number]     Ultrasound ED Ocular  Date/Time: 08/28/2024 12:47 PM  Performed by: Cecily Legrand LABOR, PA-C Authorized by: Claudius Mich A, PA-C   PROCEDURE DETAILS:    Indications: visual change     Assessed:  Left eye   Left eye axial view: obtained     Images: archived     Limitations:  None LEFT EYE FINDINGS:     no  foreign body noted in left eye    left eye lens not dislodged    no left eye increased optic nerve sheath diameter    no retrobulbar hematoma in left eye    no evidence of retinal detachment of the left eye    no ruptured globe in left eye    no vitreous hemorrhage in left eye    Medications Ordered in the ED - No data to display  Clinical Course as of 08/28/24 1247  Wed Aug 27, 2024  1619 MRI brain and Ophtho  [WF]    Clinical Course User Index [WF] Neldon Hamp RAMAN, [place]                                 Medical Decision Making Amount and/or Complexity of Data Reviewed Labs: ordered. Radiology: ordered.   This patient presents to the ED for concern of blurry vision, this involves an extensive number of treatment options, and is a complaint that carries with it a high risk of complications and morbidity.  The differential diagnosis includes retinal detachment, vitreous detachment, stroke, amaurosis fugax   Co morbidities that complicate the patient evaluation  Hypertension, SVT, thoracic aortic atherosclerosis, antiphospholipid syndrome   Lab Tests:  I Ordered, and personally interpreted labs.  The pertinent results include: CBC unremarkable, CMP unremarkable, CBG slightly elevated 104, UDS negative, PT/INR, APTT pending, ethanol pending   Imaging Studies ordered:  I ordered imaging studies including CT head I independently visualized and interpreted imaging which showed negative for any acute findings I agree with the radiologist interpretation   Cardiac Monitoring: / EKG:  The patient was maintained on a cardiac monitor.  I personally viewed and interpreted the cardiac monitored which showed an underlying rhythm of: Sinus rhythm   Consultations Obtained:  I requested consultation with none,  and discussed lab and imaging findings as well as pertinent plan - they recommend: N/A   Problem List / ED Course / Critical interventions / Medication management  Patient  presents the emergency department today for concerns of blurry vision.  Reports that she had wavy distortion to her vision that started this morning around 10 AM.  She states that she developed a posterior headache about 30 minutes later but both vision and headache have now resolved.  She states that abdomen is back to baseline.  She is here for evaluation to ensure that she did not have a stroke.  No prior history of similar symptoms.  States that she was  seen by her ophthalmologist about 1 month ago with no acute finding seen at that time. On exam, patient has no abnormal heart or lung sounds.  No obvious signs of ocular injury or abnormality with no ptosis, chemosis, or hemorrhage. Bedside ocular ultrasound performed which shows no signs of retinal or vitreous detachment. Consult placed to neurology for recommendations for possible imaging given patient's reported symptoms. I have reviewed the patients home medicines and have made adjustments as needed  12:47 PM Care of [place] transferred to [facility] and Dr. Freddi at the end of my shift as the patient will require reassessment once labs/imaging have resulted. Patient presentation, ED course, and plan of care discussed with review of all pertinent labs and imaging. Please see his/her note for further details regarding further ED course and disposition. Plan at time of handoff is consultation with neurology for possible imaging recommendations given resolution of her symptoms. Sounds more consistent with some component of migraine however vision loss preceded event so unclear if this was possible aura. This may be altered or completely changed at the discretion of the oncoming team pending results of further workup.  Social Determinants of Health:  None   Test / Admission - Considered:  Final disposition per oncoming team.  Final diagnoses:  Ocular migraine  Acute nonintractable headache, unspecified headache type  Vision changes    ED  Discharge Orders          Ordered    Ambulatory referral to Neurology       Comments: An appointment is requested in approximately: 4 weeks   08/27/24 2015               Cecily Legrand LABOR, PA-C 08/27/24 1523    Cecily Legrand LABOR, PA-C 08/28/24 1247    Horton, Roxie HERO, DO 08/29/24 [phone]

## 2024-08-28 ENCOUNTER — Encounter: Payer: Self-pay | Admitting: Pulmonary Disease

## 2024-08-28 ENCOUNTER — Ambulatory Visit: Admitting: Pulmonary Disease

## 2024-08-28 ENCOUNTER — Encounter: Payer: Self-pay | Admitting: Neurology

## 2024-08-28 VITALS — BP 157/88 | HR 64 | Temp 97.9°F | Ht 65.0 in | Wt 235.0 lb

## 2024-08-28 DIAGNOSIS — G4733 Obstructive sleep apnea (adult) (pediatric): Secondary | ICD-10-CM | POA: Diagnosis not present

## 2024-08-28 NOTE — Patient Instructions (Signed)
 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

## 2024-08-28 NOTE — Progress Notes (Signed)
 Kirsten Choi    [id number]    1956/05/18  Primary Care Physician:Sebastian Beverley NOVAK, MD  Referring Physician: Sebastian Beverley NOVAK, MD [address] [place],  [place] [address]  Chief complaint:   Follow-up for obstructive sleep apnea  HPI:  Having some difficulty with DME  She continues to use CPAP on a nightly basis Waking up rested History of moderate obstructive sleep apnea  Denies any daytime sleepiness Wakes up feeling like she got a good nights rest  Usually goes to bed about 10 PM, wakes up at 5 AM 5 minutes to fall asleep 3-4 awakenings  Weight has been stable since the last visit  She does have a history of dysrhythmia as Difficult to control blood pressure   Outpatient Encounter Medications as of 08/28/2024  Medication Sig   amLODipine  (NORVASC ) 5 MG tablet Take 1 tablet (5 mg total) by mouth daily.   aspirin  EC 81 MG tablet Take 1 tablet (81 mg total) by mouth daily. Swallow whole.   ezetimibe  (ZETIA ) 10 MG tablet Take 1 tablet (10 mg total) by mouth daily.   fluticasone  (FLONASE ) 50 MCG/ACT nasal spray Place into both nostrils as needed for allergies or rhinitis.   indapamide  (LOZOL ) 1.25 MG tablet TAKE 1 TABLET BY MOUTH DAILY.   lisinopril  (ZESTRIL ) 40 MG tablet Take 1 tablet (40 mg total) by mouth daily.   metoprolol  succinate (TOPROL -XL) 25 MG 24 hr tablet Take 0.5 tablets (12.5 mg total) by mouth daily.   Multiple Vitamin (MULTIVITAMIN PO) Take by mouth.   omeprazole  (PRILOSEC) 40 MG capsule Take 40 mg by mouth 2 (two) times daily.   psyllium (METAMUCIL) 58.6 % packet Take 1 packet by mouth daily. (Patient taking differently: Take 1 packet by mouth daily. PRN)   clobetasol  ointment (TEMOVATE ) 0.05 % Apply 1 Application topically daily. (Patient not taking: Reported on 08/28/2024)   meclizine  (ANTIVERT ) 25 MG tablet Take 1 tablet (25 mg total) by mouth 3 (three) times daily as needed for dizziness. (Patient not taking: Reported on  08/28/2024)   No facility-administered encounter medications on file as of 08/28/2024.    Allergies as of 08/28/2024 - Review Complete 08/28/2024  Allergen Reaction Noted   Nickel Dermatitis 10/19/2016   Nsaids Other (See Comments) 03/13/2024   Sertraline Other (See Comments) 09/04/2020   Statins Other (See Comments) 05/10/2021   Tape  02/14/2019    Past Medical History:  Diagnosis Date   Arthritis    Cancer (HCC)    breast CA (resolved)   Hypertension    Migraine     Past Surgical History:  Procedure Laterality Date   BREAST SURGERY     CHOLECYSTECTOMY     COSMETIC SURGERY     HERNIA REPAIR     per pt-did not have   REPLACEMENT TOTAL KNEE     WRIST SURGERY      Family History  Problem Relation Age of Onset   Leukemia Mother    Stroke Father    CAD Father    Cancer Father    Thyroid  cancer Father     Social History   Socioeconomic History   Marital status: Widowed    Spouse name: Not on file   Number of children: Not on file   Years of education: Not on file   Highest education level: Not on file  Occupational History   Not on file  Tobacco Use   Smoking status: Never   Smokeless tobacco:  Never  Vaping Use   Vaping status: Never Used  Substance and Sexual Activity   Alcohol use: Never   Drug use: Never   Sexual activity: Not Currently    Partners: Male    Birth control/protection: Post-menopausal  Other Topics Concern   Not on file  Social History Narrative   Not on file   Social Drivers of Health   Financial Resource Strain: Patient Declined (08/15/2022)   Received from [facility]   Overall Financial Resource Strain (CARDIA)    Difficulty of Paying Living Expenses: Patient declined  Food Insecurity: Low Risk  (01/16/2024)   Received from [facility]   Hunger Vital Sign    Within the past 12 months, you worried that your food would run out before you got money to buy more: Never true    Within the past 12 months, the food you bought  just didn't last and you didn't have money to get more. : Never true  Transportation Needs: No Transportation Needs (01/16/2024)   Received from [facility]    In the past 12 months, has lack of reliable transportation kept you from medical appointments, meetings, work or from getting things needed for daily living? : No  Physical Activity: Sufficiently Active (08/15/2022)   Received from [facility] [employer], [facility] visits prior to 01/06/2023.   Exercise Vital Sign    On average, how many days per week do you engage in moderate to strenuous exercise (like a brisk walk)?: 6 days    On average, how many minutes do you engage in exercise at this level?: 60 min  Stress: No Stress Concern Present (08/15/2022)   Received from [facility] - Occupational Stress Questionnaire  Social Connections: Unknown (08/15/2022)   Received from [facility] visits prior to 01/06/2023.   Social Connection and Isolation Panel    In a typical week, how many times do you talk on the phone with family, friends, or neighbors?: More than three times a week    How often do you get together with friends or relatives?: More than three times a week    How often do you attend church or religious services?: More than 4 times per year    Do you belong to any clubs or organizations such as church groups, unions, fraternal or athletic groups, or school groups?: Yes    How often do you attend meetings of the clubs or organizations you belong to?: More than 4 times per year    Are you married, widowed, divorced, separated, never married, or living with a partner?: Patient refused  Intimate Partner Violence: Unknown (08/15/2022)   Received from [facility] visits prior to 01/06/2023.   Humiliation, Afraid, Rape, and Kick questionnaire    Within the last year, have you been afraid of your partner or ex-partner?:  Patient refused    Within the last year, have you been humiliated or emotionally abused in other ways by your partner or ex-partner?: Patient refused    Within the last year, have you been kicked, hit, slapped, or otherwise physically hurt by your partner or ex-partner?: Patient refused    Within the last year, have you been raped or forced to have any kind of sexual activity by your partner or ex-partner?: Patient refused    Review of Systems  Respiratory:  Positive for apnea.   Psychiatric/Behavioral:  Positive for sleep disturbance.  Vitals:   08/28/24 1458  BP: (!) 157/88  Pulse: 64  Temp: 97.9 F (36.6 C)  SpO2: 95%      Physical Exam Constitutional:      Appearance: She is obese.  HENT:     Head: Normocephalic.     Nose: Nose normal.     Mouth/Throat:     Comments: Mallampati 3 Eyes:     General: No scleral icterus.    Pupils: Pupils are equal, round, and reactive to light.  Cardiovascular:     Rate and Rhythm: Normal rate and regular rhythm.     Heart sounds: No murmur heard.    No friction rub.  Pulmonary:     Effort: No respiratory distress.     Breath sounds: No stridor. No wheezing or rhonchi.  Musculoskeletal:     Cervical back: No rigidity or tenderness.  Neurological:     Mental Status: She is alert.  Psychiatric:        Mood and Affect: Mood normal.    Data Reviewed: Patient's AHI is 15.1 on a sleep study  Compliance reviewed on the patient's phone showing Compliance average use of 7 hours, residual AHI less than 1  Assessment:  Moderate obstructive sleep apnea - Tolerate CPAP well Compliant with CPAP - Benefiting from CPAP use  Hypertension - Controlled  Shortness of breath - On albuterol  as needed   Plan/Recommendations:  DME referral to [facility] to change from [employer] CPAP 5-15  Albuterol  as needed  Graded exercise as tolerated  Follow-up in 6 months  Jennet Epley MD [facility] 08/28/2024, 3:08 PM  CC: Sebastian Beverley NOVAK, MD

## 2024-09-11 ENCOUNTER — Other Ambulatory Visit: Payer: Self-pay | Admitting: Family Medicine

## 2024-09-11 DIAGNOSIS — I10 Essential (primary) hypertension: Secondary | ICD-10-CM

## 2024-09-11 MED ORDER — INDAPAMIDE 1.25 MG PO TABS: 1.2500 mg | ORAL_TABLET | Freq: Every day | ORAL | 0 refills | Status: AC

## 2024-09-11 NOTE — Telephone Encounter (Signed)
 Copied from CRM [id number]. Topic: Clinical - Medication Refill >> Sep 11, 2024 10:39 AM Thersia C wrote: Medication: indapamide  (LOZOL ) 1.25 MG tablet  Has the patient contacted their pharmacy? Yes (Agent: If no, request that the patient contact the pharmacy for the refill. If patient does not wish to contact the pharmacy document the reason why and proceed with request.) (Agent: If yes, when and what did the pharmacy advise?)  This is the patient's preferred pharmacy:   [id number] - [place], [place] - [address] [place] [address] Phone: [phone] Fax: [phone] Hours: Not open 24 hours    Is this the correct pharmacy for this prescription? Yes If no, delete pharmacy and type the correct one.   Has the prescription been filled recently? No  Is the patient out of the medication? Yes  Has the patient been seen for an appointment in the last year OR does the patient have an upcoming appointment? Yes  Can we respond through MyChart? Yes  Agent: Please be advised that Rx refills may take up to 3 business days. We ask that you follow-up with your pharmacy.

## 2024-09-18 ENCOUNTER — Telehealth (HOSPITAL_BASED_OUTPATIENT_CLINIC_OR_DEPARTMENT_OTHER): Payer: Self-pay

## 2024-09-18 NOTE — Telephone Encounter (Signed)
 CMN received for CPAP supplies signed by provider and faxed confirmation received sent to [facility]

## 2024-09-30 ENCOUNTER — Encounter (HOSPITAL_BASED_OUTPATIENT_CLINIC_OR_DEPARTMENT_OTHER)

## 2024-09-30 ENCOUNTER — Ambulatory Visit: Admitting: Primary Care

## 2024-11-13 ENCOUNTER — Ambulatory Visit: Admitting: Family Medicine

## 2024-11-25 ENCOUNTER — Ambulatory Visit: Admitting: Neurology

## 2024-11-25 ENCOUNTER — Ambulatory Visit: Admitting: Family Medicine

## 2024-12-02 NOTE — Progress Notes (Unsigned)
 "  NEUROLOGY CONSULTATION NOTE  Kirsten Choi MRN: [id number] DOB: 20-Nov-1955  Referring provider: Hamp GORMAN Bow, PA (ED referral) Primary care provider: Beverley KATHEE Hummer, MD  Reason for consult:  migraine  Assessment/Plan:   ***   Subjective:  Kirsten Choi is a 69 year old ***-handed female with HTN, arthritis, OSA and history of breast cancer who presents for migraines.  History supplemented by ED note.  On 08/27/2024 ***.  Evaluated in the ED.  MRI of brain without contrast revealed chronic small vessel ischemic changes in the cerebral white matter but no acute intracranial abnormality.  Discussed with neurology and ophthalmology who suspected ocular migraine.    Past NSAIDS/analgesics:  *** Past abortive triptans:  *** Past abortive ergotamine:  *** Past muscle relaxants:  *** Past anti-emetic:  *** Past antihypertensive medications:  *** Past antidepressant/antipsychotic medications:  *** Past anticonvulsant medications:  *** Past anti-CGRP:  *** Past vitamins/Herbal/Supplements:  *** Past antihistamines/decongestants:  *** Other past therapies:  ***  Current NSAIDS/analgesics:  *** Current triptans:  *** Current ergotamine:  *** Current anti-emetic:  *** Current muscle relaxants:  *** Current Antihypertensive medications:  *** Current Antidepressant/antipsychotic medications:  *** Current Anticonvulsant medications:  *** Current anti-CGRP:  *** Current Vitamins/Herbal/Supplements:  *** Current Antihistamines/Decongestants:  *** Other therapy:  *** Birth control:  *** Other medications:  ***   Caffeine:  *** Alcohol:  *** Smoker:  *** Diet:  *** Exercise:  *** Depression:  ***; Anxiety:  *** Other pain:  *** Sleep hygiene:  ***  History of TBI/concussion:  *** Family history of headache:  *** Family history of cerebral aneurysm:  ***      PAST MEDICAL HISTORY: Past Medical History:  Diagnosis Date   Arthritis    Cancer (HCC)    breast CA  (resolved)   Hypertension    Migraine     PAST SURGICAL HISTORY: Past Surgical History:  Procedure Laterality Date   BREAST SURGERY     CHOLECYSTECTOMY     COSMETIC SURGERY     HERNIA REPAIR     per pt-did not have   REPLACEMENT TOTAL KNEE     WRIST SURGERY      MEDICATIONS: Medications Ordered Prior to Encounter[1]  ALLERGIES: Allergies[2]  FAMILY HISTORY: Family History  Problem Relation Age of Onset   Leukemia Mother    Stroke Father    CAD Father    Cancer Father    Thyroid  cancer Father     Objective:  *** General: No acute distress.  Patient appears well-groomed.   Head:  Normocephalic/atraumatic Eyes:  fundi examined but not visualized Neck: supple, no paraspinal tenderness, full range of motion Heart: regular rate and rhythm Neurological Exam: Mental status: alert and oriented to person, place, and time, speech fluent and not dysarthric, language intact. Cranial nerves: CN I: not tested CN II: pupils equal, round and reactive to light, visual fields intact CN III, IV, VI:  full range of motion, no nystagmus, no ptosis CN V: facial sensation intact. CN VII: upper and lower face symmetric CN VIII: hearing intact CN IX, X: gag intact, uvula midline CN XI: sternocleidomastoid and trapezius muscles intact CN XII: tongue midline Bulk & Tone: normal, no fasciculations. Motor:  muscle strength 5/5 throughout Sensation:  Pinprick and vibratory sensation intact. Deep Tendon Reflexes:  2+ throughout,  toes downgoing.   Finger to nose testing:  Without dysmetria.   Gait:  Normal station and stride.  Romberg negative.    Kirsten Dunnings, DO  CC:  Beverley KATHEE Hummer, MD        [1]  Current Outpatient Medications on File Prior to Visit  Medication Sig Dispense Refill   amLODipine  (NORVASC ) 5 MG tablet Take 1 tablet (5 mg total) by mouth daily. 90 tablet 3   aspirin  EC 81 MG tablet Take 1 tablet (81 mg total) by mouth daily. Swallow whole. 90 tablet 3    ezetimibe  (ZETIA ) 10 MG tablet Take 1 tablet (10 mg total) by mouth daily. 90 tablet 3   fluticasone  (FLONASE ) 50 MCG/ACT nasal spray Place into both nostrils as needed for allergies or rhinitis.     indapamide  (LOZOL ) 1.25 MG tablet Take 1 tablet (1.25 mg total) by mouth daily. 90 tablet 0   lisinopril  (ZESTRIL ) 40 MG tablet Take 1 tablet (40 mg total) by mouth daily. 90 tablet 3   metoprolol  succinate (TOPROL -XL) 25 MG 24 hr tablet Take 0.5 tablets (12.5 mg total) by mouth daily. 45 tablet 3   Multiple Vitamin (MULTIVITAMIN PO) Take by mouth.     omeprazole  (PRILOSEC) 40 MG capsule Take 40 mg by mouth 2 (two) times daily.     psyllium (METAMUCIL) 58.6 % packet Take 1 packet by mouth daily. (Patient taking differently: Take 1 packet by mouth daily. PRN)     No current facility-administered medications on file prior to visit.  [2]  Allergies Allergen Reactions   Nickel Dermatitis   Nsaids Other (See Comments)    Antiphospholipid syndrome and gastric ulcers   Sertraline Other (See Comments)   Statins Other (See Comments)    Liver damage   Tape    "

## 2024-12-03 ENCOUNTER — Ambulatory Visit: Admitting: Neurology

## 2024-12-03 NOTE — Progress Notes (Signed)
 "  NEUROLOGY CONSULTATION NOTE  Kirsten Choi MRN: [id number] DOB: 1956/06/06  Referring provider: Hamp GORMAN Bow, PA (ED referral) Primary care provider: Beverley KATHEE Hummer, MD  Reason for consult:  migraine  Assessment/Plan:   Ocular migraine Subjective memory complaints - may be residual from chemotherapy treatment and aggravated by underlying anxiety.  Does not appear to exhibit any cognitive impairment on MoCA.  Underlying neurodegenerative disease is not suspected. Hypertension - elevated today secondary to anxiety   She has already had the necessary workup in the ED and by primary care.  If she should have a recurrence of ocular migraine, treatment not indicated unless they become frequent and concerning to the patient.  No further workup is recommended in regards to memory.  Total time spent on today's visit was 45 minutes dedicated to this patient today, preparing to see patient, examining the patient, ordering tests and/or medications and counseling the patient, documenting clinical information in the EHR or other health record, independently interpreting results and communicating results to the patient/family, discussing treatment and goals, answering patient's questions and coordinating care.     Subjective:   Discussed the use of AI scribe software for clinical note transcription with the patient, who gave verbal consent to proceed.  History of Present Illness Kirsten Choi is a 69 year old woman with OSA on CPAP, anxiety, HTN and history of breast cancer who presents for evaluation of progressive memory impairment and recent visual disturbance as well as memory concerns.  History supplemented by ED note.  CT/CTA and MRI personally reviewed.   Ocular migraine: On August 27, 2024, she experienced a transient episode of visual disturbance described as wavy lines or a ribbon of waves affecting both eyes, lasting 30 to 60 minutes. There was no associated headache, eye pain,  numbness, weakness, or other focal neurological symptoms. She has not had recurrent visual symptoms since this episode.  Evaluated in the ED.  MRI of brain without contrast revealed chronic small vessel ischemic changes in the cerebral white matter but no acute intracranial abnormality.  Sed rate 7.  CRP 0.8.  Discussed with neurology and ophthalmology who suspected ocular migraine.    She has a remote history of migraines, primarily during her working years, characterized by severe headaches sometimes preceded by visual phenomena such as flashes or bright lights. She has not experienced migraine headaches in many years.  Memory deficits: She completed breast cancer treatment in 2000, including five years of estrogen-positive therapy with Lupron and Arimidex, and was unable to tolerate tamoxifen. She attributes the onset of her memory impairment to the period following chemotherapy.  Over the past one to two years, she has experienced progressive memory impairment, which she partially attributes to cognitive changes following chemotherapy for breast cancer. She describes increasing forgetfulness, including difficulty recalling recent activities such as watching movies or reading books, and frequently relies on written notes. Despite these deficits, she remains independent in managing finances and activities of daily living. She does use GPS when driving but that is mostly a crutch rather than actually becoming disoriented on familiar routes.  She reports significant anxiety, particularly in medical settings.  Labs from June 2025 included TSH 1.09, B12 826, and folate >23.2.  She reports that her father had dementia.  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    Benign paroxysmal positional vertigo: Approximately one month prior to the October event, she had another episode prompting an emergency department visit.  She began experiencing vertigo triggered with head  and neck movements.  She  has a history of BPPV causing falls that responded to the Epley maneuver.  CT head revealed no acute intracranial abnormality.  CTA head and neck revealed mild atherosclerotic changes at the carotid bifurcations and diffuse tortuosity of the major intracranial and extracranial arterial vasculature but no LVO or hemodynamically significant stenosis.  Another episode of BPPV was diagnosed.       PAST MEDICAL HISTORY: Past Medical History:  Diagnosis Date   Arthritis    Cancer (HCC)    breast CA (resolved)   Hypertension    Migraine     PAST SURGICAL HISTORY: Past Surgical History:  Procedure Laterality Date   BREAST SURGERY     CHOLECYSTECTOMY     COSMETIC SURGERY     HERNIA REPAIR     per pt-did not have   REPLACEMENT TOTAL KNEE     WRIST SURGERY      MEDICATIONS: Medications Ordered Prior to Encounter[1]  ALLERGIES: Allergies[2]  FAMILY HISTORY: Family History  Problem Relation Age of Onset   Leukemia Mother    Stroke Father    CAD Father    Cancer Father    Thyroid  cancer Father     Objective:  Blood pressure (!) 177/103, pulse 79, height 5' 4 (1.626 m), weight 246 lb 3.2 oz (111.7 kg), SpO2 96%. General: No acute distress.  Patient appears well-groomed.   Head:  Normocephalic/atraumatic Eyes:  fundi examined but not visualized Neck: supple, no paraspinal tenderness, full range of motion Heart: regular rate and rhythm Neurological Exam: Mental status: alert and oriented to person, place, and time, speech fluent and not dysarthric, language intact.    12/05/2024   11:00 AM  Montreal Cognitive Assessment   Visuospatial/ Executive (0/5) 5  Naming (0/3) 3  Attention: Read list of digits (0/2) 2  Attention: Read list of letters (0/1) 1  Attention: Serial 7 subtraction starting at 100 (0/3) 3  Language: Repeat phrase (0/2) 2  Language : Fluency (0/1) 1  Abstraction (0/2) 1  Delayed Recall (0/5) 5  Orientation (0/6) 6  Total 29  Adjusted Score (based on  education) 29   Cranial nerves: CN I: not tested CN II: pupils equal, round and reactive to light, visual fields intact CN III, IV, VI:  full range of motion, no nystagmus, no ptosis CN V: facial sensation intact. CN VII: upper and lower face symmetric CN VIII: hearing intact CN IX, X: gag intact, uvula midline CN XI: sternocleidomastoid and trapezius muscles intact CN XII: tongue midline Bulk & Tone: normal, no fasciculations. Motor:  muscle strength 5/5 throughout Sensation:  Pinprick and vibratory sensation intact. Deep Tendon Reflexes:  2+ throughout,  toes downgoing.   Finger to nose testing:  Without dysmetria.   Gait:  Normal station and stride.  Romberg negative.    [name], DO  CC:  Beverley KATHEE Hummer, MD        [1]  Current Outpatient Medications on File Prior to Visit  Medication Sig Dispense Refill   amLODipine  (NORVASC ) 5 MG tablet Take 1 tablet (5 mg total) by mouth daily. 90 tablet 3   aspirin  EC 81 MG tablet Take 1 tablet (81 mg total) by mouth daily. Swallow whole. 90 tablet 3   ezetimibe  (ZETIA ) 10 MG tablet Take 1 tablet (10 mg total) by mouth daily. 90 tablet 3   fluticasone  (FLONASE ) 50 MCG/ACT nasal spray Place into both nostrils as needed for allergies or rhinitis.     indapamide  (  LOZOL ) 1.25 MG tablet Take 1 tablet (1.25 mg total) by mouth daily. 90 tablet 0   lisinopril  (ZESTRIL ) 40 MG tablet Take 1 tablet (40 mg total) by mouth daily. 90 tablet 3   metoprolol  succinate (TOPROL -XL) 25 MG 24 hr tablet Take 0.5 tablets (12.5 mg total) by mouth daily. 45 tablet 3   Multiple Vitamin (MULTIVITAMIN PO) Take by mouth.     omeprazole  (PRILOSEC) 40 MG capsule Take 40 mg by mouth 2 (two) times daily.     psyllium (METAMUCIL) 58.6 % packet Take 1 packet by mouth daily. (Patient taking differently: Take 1 packet by mouth daily. PRN)     No current facility-administered medications on file prior to visit.  [2]  Allergies Allergen Reactions   Nickel  Dermatitis   Nsaids Other (See Comments)    Antiphospholipid syndrome and gastric ulcers   Sertraline Other (See Comments)   Statins Other (See Comments)    Liver damage   Tape    "

## 2024-12-05 ENCOUNTER — Encounter: Payer: Self-pay | Admitting: Neurology

## 2024-12-05 ENCOUNTER — Ambulatory Visit (INDEPENDENT_AMBULATORY_CARE_PROVIDER_SITE_OTHER): Admitting: Neurology

## 2024-12-05 VITALS — BP 177/103 | HR 79 | Ht 64.0 in | Wt 246.2 lb

## 2024-12-05 DIAGNOSIS — I10 Essential (primary) hypertension: Secondary | ICD-10-CM

## 2024-12-05 DIAGNOSIS — R413 Other amnesia: Secondary | ICD-10-CM | POA: Diagnosis not present

## 2024-12-05 DIAGNOSIS — G43109 Migraine with aura, not intractable, without status migrainosus: Secondary | ICD-10-CM

## 2024-12-05 NOTE — Patient Instructions (Signed)
 I think you had an ocular migraine  Will order a neuropsychological evaluation for further evaluation of your memory.  Further recommendations pending results.

## 2024-12-10 ENCOUNTER — Ambulatory Visit: Payer: Self-pay

## 2024-12-10 NOTE — Telephone Encounter (Signed)
 Noted. Patient scheduled for 12/10/24 with Dr. Thedora.

## 2024-12-10 NOTE — Telephone Encounter (Signed)
 FYI Only or Action Required?: FYI only for provider: appointment scheduled on 2/4.  Patient was last seen in primary care on 08/12/2024 by Sebastian Beverley NOVAK, MD.  Called Nurse Triage reporting Abdominal Cramping and Diarrhea.  Symptoms began several days ago.  Interventions attempted: Rest, hydration, or home remedies.  Symptoms are: gradually worsening.  Triage Disposition: See HCP Within 4 Hours (Or PCP Triage)  Patient/caregiver understands and will follow disposition?: Yes  Message from [facility] sent at 12/10/2024  1:18 PM EST  Reason for Triage: Abdominal cramping, gas, Diarrhea, bloating sharp pains on left side of  abdomen   Reason for Disposition  [1] MILD-MODERATE abdomen pain AND [2] constant AND [3] present > 2 hours  Answer Assessment - Initial Assessment Questions Patient with abdominal bloating and cramping for 3 days- occurs everytime she eats. Stomach bloats and then feels nauseous but denies vomiting. Diarrhea 3x last night but nothing today. Occasional sharp pains on the left lower. Denies CP, SOB, Fever, bloody stools, emesis. Stomach makes a very loud gurgling noise after she eats. Limiting foods to crackers, tea, toast.   Appt with PCP office in AM to assess. Understands ED precautions    1. SYMPTOM: What's the main symptom you're concerned about? (e.g., abdomen bloating, swelling)     Abdominal bloating and cramping  2. ONSET: When did bloating   start?     3 days  3. SEVERITY: How bad is the bloating or swelling?     Moderate to severe 4. ABDOMEN PAIN:  Is there any abdomen pain? If Yes, ask: How bad is the pain?  (e.g., Scale 0-10; or none, mild, moderate, or severe)     7-8/10 5. RELIEVING AND AGGRAVATING FACTORS: What makes it better or worse? (e.g., certain foods, lactose, medicines)     Eating any 6. GI HISTORY: Do you have any history of stomach or intestine problems? (e.g., bowel obstruction, cancer, irritable bowel)      Denies  7.  CAUSE: What do you think is causing the bloating?      unsure 8. OTHER SYMPTOMS: Do you have any other symptoms? (e.g., belching, blood in stool, breathing difficulty, constipation, diarrhea, fever, passing gas, vomiting, weight loss, white of eyes have turned yellow)     Diarrhea last night x3  Protocols used: Abdomen Bloating and Swelling-A-AH

## 2024-12-11 ENCOUNTER — Ambulatory Visit: Admitting: Family Medicine

## 2024-12-11 ENCOUNTER — Ambulatory Visit: Payer: Self-pay | Admitting: Family Medicine

## 2024-12-11 ENCOUNTER — Encounter: Payer: Self-pay | Admitting: Family Medicine

## 2024-12-11 VITALS — BP 134/76 | HR 77 | Temp 97.5°F | Ht 64.0 in | Wt 239.8 lb

## 2024-12-11 DIAGNOSIS — R109 Unspecified abdominal pain: Secondary | ICD-10-CM | POA: Insufficient documentation

## 2024-12-11 DIAGNOSIS — R197 Diarrhea, unspecified: Secondary | ICD-10-CM

## 2024-12-11 LAB — CBC WITH DIFFERENTIAL/PLATELET
Basophils Absolute: 0.1 10*3/uL (ref 0.0–0.1)
Basophils Relative: 0.7 % (ref 0.0–3.0)
Eosinophils Absolute: 0.2 10*3/uL (ref 0.0–0.7)
Eosinophils Relative: 2 % (ref 0.0–5.0)
HCT: 42.7 % (ref 36.0–46.0)
Hemoglobin: 14.3 g/dL (ref 12.0–15.0)
Lymphocytes Relative: 15.4 % (ref 12.0–46.0)
Lymphs Abs: 1.2 10*3/uL (ref 0.7–4.0)
MCHC: 33.6 g/dL (ref 30.0–36.0)
MCV: 84.5 fl (ref 78.0–100.0)
Monocytes Absolute: 1 10*3/uL (ref 0.1–1.0)
Monocytes Relative: 12.3 % — ABNORMAL HIGH (ref 3.0–12.0)
Neutro Abs: 5.4 10*3/uL (ref 1.4–7.7)
Neutrophils Relative %: 69.6 % (ref 43.0–77.0)
Platelets: 296 10*3/uL (ref 150.0–400.0)
RBC: 5.06 Mil/uL (ref 3.87–5.11)
RDW: 13.3 % (ref 11.5–15.5)
WBC: 7.8 10*3/uL (ref 4.0–10.5)

## 2024-12-11 LAB — COMPREHENSIVE METABOLIC PANEL WITH GFR
ALT: 52 U/L — ABNORMAL HIGH (ref 3–35)
AST: 39 U/L — ABNORMAL HIGH (ref 5–37)
Albumin: 4.5 g/dL (ref 3.5–5.2)
Alkaline Phosphatase: 91 U/L (ref 39–117)
BUN: 12 mg/dL (ref 6–23)
CO2: 24 meq/L (ref 19–32)
Calcium: 9.4 mg/dL (ref 8.4–10.5)
Chloride: 102 meq/L (ref 96–112)
Creatinine, Ser: 0.61 mg/dL (ref 0.40–1.20)
GFR: 91.14 mL/min
Glucose, Bld: 103 mg/dL — ABNORMAL HIGH (ref 70–99)
Potassium: 3.5 meq/L (ref 3.5–5.1)
Sodium: 136 meq/L (ref 135–145)
Total Bilirubin: 0.6 mg/dL (ref 0.2–1.2)
Total Protein: 7.3 g/dL (ref 6.0–8.3)

## 2024-12-11 MED ORDER — DICYCLOMINE HCL 10 MG PO CAPS: 10.0000 mg | ORAL_CAPSULE | Freq: Three times a day (TID) | ORAL | 0 refills | Status: AC

## 2024-12-11 NOTE — Progress Notes (Signed)
 " [facility] PRIMARY CARE [facility] [address] [place] [address] Dept: [phone] Dept Fax: [phone]  Office Visit  Subjective:    Patient ID: Kirsten Choi, female    DOB: 1956-03-12, 69 y.o..   MRN: [id number]  Chief Complaint  Patient presents with   Bloated    C/o abdominal bloating, cramping, diarrhea x 2-3 days.  Has been taking using a brat diet.    History of Present Illness:  Patient is in today for abdominal cramping/bloating. MS. Dyal notes she started having bloating and cramping, esp. across the upper abdomen about 2 weeks ago. She has not had fever or N/V. Three days ago, she began having watery diarrhea. she notes she had three episodes of this already today. she had not taken any meds for this, as she didn't feel safe going to the pharmacy with the recent snows. She has been eating rice, applesauce, and dry toast. She has not seen any blood in her stools. She has had a prior cholecystectomy.  Past Medical History: Patient Active Problem List   Diagnosis Date Noted   Benign paroxysmal positional vertigo due to bilateral vestibular disorder 08/12/2024   Anxiety disorder due to general medical condition 08/12/2024   PMB (postmenopausal bleeding) 06/04/2024   Lichen sclerosus et atrophicus 06/04/2024   Upper respiratory tract infection 02/19/2024   Class 2 severe obesity due to excess calories with serious comorbidity and body mass index (BMI) of 39.0 to 39.9 in adult 01/10/2024   Vitamin D  deficiency 01/10/2024   Thoracic aortic atherosclerosis 01/10/2024   History of Helicobacter pylori infection 01/10/2024   Acute anterior lower chest wall pain 01/10/2024   Chronic gastric ulcer without hemorrhage and without perforation 01/10/2024   S/P TKR (total knee replacement), left 01/10/2024   History of bilateral breast cancer 01/10/2024   Metabolic dysfunction-associated steatotic liver disease (MASLD) 01/10/2024   Cervical  radiculopathy due to degenerative joint disease of spine 01/10/2024   Antiphospholipid syndrome 01/10/2024   OSA (obstructive sleep apnea) 06/15/2022   Dyspnea 06/15/2022   Paroxysmal SVT (supraventricular tachycardia) 01/20/2020   Hypertension    Bradycardia    Past Surgical History:  Procedure Laterality Date   BREAST SURGERY     CHOLECYSTECTOMY     COSMETIC SURGERY     HERNIA REPAIR     per pt-did not have   REPLACEMENT TOTAL KNEE     WRIST SURGERY Bilateral    Family History  Problem Relation Age of Onset   Leukemia Mother    Dementia Father    Stroke Father    CAD Father    Cancer Father    Thyroid  cancer Father    Outpatient Medications Prior to Visit  Medication Sig Dispense Refill   amLODipine  (NORVASC ) 5 MG tablet Take 1 tablet (5 mg total) by mouth daily. 90 tablet 3   aspirin  EC 81 MG tablet Take 1 tablet (81 mg total) by mouth daily. Swallow whole. 90 tablet 3   ezetimibe  (ZETIA ) 10 MG tablet Take 1 tablet (10 mg total) by mouth daily. 90 tablet 3   fluticasone  (FLONASE ) 50 MCG/ACT nasal spray Place into both nostrils as needed for allergies or rhinitis.     indapamide  (LOZOL ) 1.25 MG tablet Take 1 tablet (1.25 mg total) by mouth daily. 90 tablet 0   lisinopril  (ZESTRIL ) 40 MG tablet Take 1 tablet (40 mg total) by mouth daily. 90 tablet 3   metoprolol  succinate (TOPROL -XL) 25 MG 24 hr tablet Take 0.5 tablets (  12.5 mg total) by mouth daily. 45 tablet 3   Multiple Vitamin (MULTIVITAMIN PO) Take by mouth.     omeprazole  (PRILOSEC) 40 MG capsule Take 40 mg by mouth 2 (two) times daily. (Patient taking differently: Take 40 mg by mouth daily.)     psyllium (METAMUCIL) 58.6 % packet Take 1 packet by mouth daily. (Patient taking differently: Take 1 packet by mouth daily. PRN)     No facility-administered medications prior to visit.   Allergies[1]   Objective:   Today's Vitals   12/11/24 1022  BP: 134/76  Pulse: 77  Temp: (!) 97.5 F (36.4 C)  TempSrc: Oral   SpO2: 96%  Weight: 239 lb 12.8 oz (108.8 kg)  Height: 5' 4 (1.626 m)   Body mass index is 41.16 kg/m.   General: Well developed, well nourished. No acute distress. Abdomen: Soft. Bowel sounds positive, normal pitch and frequency. No hepatosplenomegaly. Mild generalized tenderness without   rebound or guarding. Psych: Alert and oriented. Normal mood and affect.  Health Maintenance Due  Topic Date Due   Zoster Vaccines- Shingrix (1 of 2) Never done   Medicare Annual Wellness (AWV)  08/16/2023   COVID-19 Vaccine (7 - 2025-26 season) 07/07/2024     Assessment & Plan:   Problem List Items Addressed This Visit       Other   Abdominal cramping - Primary   Etiology is unclear. May represent a viral gastroenteritis vs. other acute colitis. I will have her try Gas-X and Bentyl  for comfort. Check CBC and CMP to rule out other etiologies. If symptoms not resolving over the next week, I would have her follow-up with Dr. Sebastian.      Relevant Medications   dicyclomine  (BENTYL ) 10 MG capsule   Other Relevant Orders   CBC with Differential/Platelet (Completed)   Comprehensive metabolic panel with GFR (Completed)   Other Visit Diagnoses       Diarrhea, unspecified type       I recommend using Imodium to resolve the diarrhea. Continue BRAT diet and pushing fluids until improved.   Relevant Orders   CBC with Differential/Platelet (Completed)   Comprehensive metabolic panel with GFR (Completed)       Return if symptoms worsen or fail to improve.    Garnette CHRISTELLA Simpler, MD  I,Emily Lagle,acting as a scribe for Garnette CHRISTELLA Simpler, MD.,have documented all relevant documentation on the behalf of Garnette CHRISTELLA Simpler, MD.  I, Garnette CHRISTELLA Simpler, MD, have reviewed all documentation for this visit. The documentation on 12/11/2024 for the exam, diagnosis, procedures, and orders are all accurate and complete.      [1]  Allergies Allergen Reactions   Nickel Dermatitis   Nsaids Other (See Comments)     Antiphospholipid syndrome and gastric ulcers   Sertraline Other (See Comments)   Statins Other (See Comments)    Liver damage   Tape    "

## 2024-12-11 NOTE — Assessment & Plan Note (Signed)
 Etiology is unclear. May represent a viral gastroenteritis vs. other acute colitis. I will have her try Gas-X and Bentyl  for comfort. Check CBC and CMP to rule out other etiologies. If symptoms not resolving over the next week, I would have her follow-up with Dr. Sebastian.

## 2024-12-11 NOTE — Patient Instructions (Signed)
 Recommend using Gas-X (simethicone ) to reduce gas pains. Recommend Imodium for diarrhea. Take 2 tabs initially and then 1 tab with each loose stool up to 6 tabs in 24 hours. Continue BRAT diet Hydrate well

## 2025-01-08 ENCOUNTER — Ambulatory Visit: Admitting: Family Medicine
